# Patient Record
Sex: Male | Born: 1980 | Race: White | Hispanic: No | Marital: Married | Smoking: Current every day smoker
Health system: Southern US, Community
[De-identification: ages and names within clinical notes are randomized; demographics above are authoritative.]

## PROBLEM LIST (undated history)

## (undated) ENCOUNTER — Ambulatory Visit (HOSPITAL_COMMUNITY): Admission: EM | Payer: BC Managed Care – PPO | Source: Home / Self Care

## (undated) DIAGNOSIS — F329 Major depressive disorder, single episode, unspecified: Secondary | ICD-10-CM

## (undated) DIAGNOSIS — I10 Essential (primary) hypertension: Secondary | ICD-10-CM

## (undated) DIAGNOSIS — F32A Depression, unspecified: Secondary | ICD-10-CM

## (undated) DIAGNOSIS — G43909 Migraine, unspecified, not intractable, without status migrainosus: Secondary | ICD-10-CM

## (undated) DIAGNOSIS — F419 Anxiety disorder, unspecified: Secondary | ICD-10-CM

## (undated) DIAGNOSIS — T1491XA Suicide attempt, initial encounter: Secondary | ICD-10-CM

## (undated) HISTORY — DX: Anxiety disorder, unspecified: F41.9

---
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
## 2002-02-02 ENCOUNTER — Emergency Department (HOSPITAL_COMMUNITY): Admission: EM | Admit: 2002-02-02 | Discharge: 2002-02-02 | Payer: Self-pay | Admitting: *Deleted

## 2002-02-04 ENCOUNTER — Emergency Department (HOSPITAL_COMMUNITY): Admission: EM | Admit: 2002-02-04 | Discharge: 2002-02-04 | Payer: Self-pay | Admitting: Emergency Medicine

## 2002-02-15 ENCOUNTER — Emergency Department (HOSPITAL_COMMUNITY): Admission: EM | Admit: 2002-02-15 | Discharge: 2002-02-15 | Payer: Self-pay | Admitting: Emergency Medicine

## 2002-07-31 ENCOUNTER — Encounter: Admission: RE | Admit: 2002-07-31 | Discharge: 2002-10-29 | Payer: Self-pay

## 2003-01-11 ENCOUNTER — Emergency Department (HOSPITAL_COMMUNITY): Admission: EM | Admit: 2003-01-11 | Discharge: 2003-01-11 | Payer: Self-pay | Admitting: Emergency Medicine

## 2004-09-08 ENCOUNTER — Ambulatory Visit: Payer: Self-pay

## 2006-07-27 ENCOUNTER — Emergency Department (HOSPITAL_COMMUNITY): Admission: EM | Admit: 2006-07-27 | Discharge: 2006-07-27 | Payer: Self-pay | Admitting: Emergency Medicine

## 2008-01-14 ENCOUNTER — Emergency Department: Payer: Self-pay | Admitting: Emergency Medicine

## 2008-01-14 ENCOUNTER — Emergency Department (HOSPITAL_COMMUNITY): Admission: EM | Admit: 2008-01-14 | Discharge: 2008-01-15 | Payer: Self-pay | Admitting: Emergency Medicine

## 2008-04-13 ENCOUNTER — Emergency Department (HOSPITAL_COMMUNITY): Admission: EM | Admit: 2008-04-13 | Discharge: 2008-04-13 | Payer: Self-pay | Admitting: Emergency Medicine

## 2009-05-23 ENCOUNTER — Emergency Department (HOSPITAL_BASED_OUTPATIENT_CLINIC_OR_DEPARTMENT_OTHER): Admission: EM | Admit: 2009-05-23 | Discharge: 2009-05-23 | Payer: Self-pay | Admitting: Emergency Medicine

## 2009-07-13 ENCOUNTER — Emergency Department (HOSPITAL_COMMUNITY): Admission: EM | Admit: 2009-07-13 | Discharge: 2009-07-13 | Payer: Self-pay | Admitting: Emergency Medicine

## 2009-07-15 ENCOUNTER — Emergency Department (HOSPITAL_COMMUNITY): Admission: EM | Admit: 2009-07-15 | Discharge: 2009-07-15 | Payer: Self-pay | Admitting: Emergency Medicine

## 2010-01-11 ENCOUNTER — Emergency Department (HOSPITAL_COMMUNITY): Admission: EM | Admit: 2010-01-11 | Discharge: 2010-01-11 | Payer: Self-pay | Admitting: Family Medicine

## 2010-09-06 LAB — CULTURE, ROUTINE-ABSCESS

## 2010-09-23 LAB — GC/CHLAMYDIA PROBE AMP, URINE
Chlamydia, Swab/Urine, PCR: NEGATIVE
GC Probe Amp, Urine: NEGATIVE

## 2010-09-23 LAB — DIFFERENTIAL
Basophils Absolute: 0 K/uL (ref 0.0–0.1)
Basophils Relative: 0 % (ref 0–1)
Eosinophils Absolute: 0.1 K/uL (ref 0.0–0.7)
Eosinophils Relative: 1 % (ref 0–5)
Lymphocytes Relative: 18 % (ref 12–46)
Lymphs Abs: 1.8 K/uL (ref 0.7–4.0)
Monocytes Absolute: 0.7 10*3/uL (ref 0.1–1.0)
Monocytes Relative: 7 % (ref 3–12)
Neutro Abs: 7.7 10*3/uL (ref 1.7–7.7)
Neutrophils Relative %: 74 % (ref 43–77)

## 2010-09-23 LAB — CBC
HCT: 45.9 % (ref 39.0–52.0)
Hemoglobin: 15.8 g/dL (ref 13.0–17.0)
MCHC: 34.5 g/dL (ref 30.0–36.0)
MCV: 86.9 fL (ref 78.0–100.0)
Platelets: 241 10*3/uL (ref 150–400)
RBC: 5.28 MIL/uL (ref 4.22–5.81)
RDW: 11.9 % (ref 11.5–15.5)
WBC: 10.3 10*3/uL (ref 4.0–10.5)

## 2010-09-23 LAB — URINALYSIS, ROUTINE W REFLEX MICROSCOPIC
Bilirubin Urine: NEGATIVE
Glucose, UA: NEGATIVE mg/dL
Hgb urine dipstick: NEGATIVE
Ketones, ur: NEGATIVE mg/dL
Nitrite: NEGATIVE
Protein, ur: NEGATIVE mg/dL
Specific Gravity, Urine: 1.03 (ref 1.005–1.030)
Urobilinogen, UA: 1 mg/dL (ref 0.0–1.0)
pH: 6 (ref 5.0–8.0)

## 2010-09-23 LAB — POCT TOXICOLOGY PANEL
Cocaine: POSITIVE
Tetrahydrocannabinol: POSITIVE

## 2010-09-23 LAB — COMPREHENSIVE METABOLIC PANEL WITH GFR
ALT: 39 U/L (ref 0–53)
CO2: 25 meq/L (ref 19–32)
Calcium: 9.7 mg/dL (ref 8.4–10.5)
GFR calc non Af Amer: >60 mL/min (ref >60)
Glucose, Bld: 108 mg/dL — ABNORMAL HIGH (ref 70–99)
Sodium: 145 meq/L (ref 135–145)
Total Bilirubin: 0.7 mg/dL (ref 0.3–1.2)

## 2010-09-23 LAB — COMPREHENSIVE METABOLIC PANEL
AST: 24 U/L (ref 0–37)
Albumin: 5.1 g/dL (ref 3.5–5.2)
Alkaline Phosphatase: 103 U/L (ref 39–117)
BUN: 14 mg/dL (ref 6–23)
Chloride: 103 mEq/L (ref 96–112)
Creatinine, Ser: 1 mg/dL (ref 0.4–1.5)
GFR calc Af Amer: >60 mL/min (ref >60)
Potassium: 4.1 mEq/L (ref 3.5–5.1)
Total Protein: 7.9 g/dL (ref 6.0–8.3)

## 2010-09-23 LAB — URINE CULTURE
Colony Count: NO GROWTH
Culture: NO GROWTH

## 2010-09-23 LAB — URINE MICROSCOPIC-ADD ON

## 2011-09-19 ENCOUNTER — Encounter (HOSPITAL_COMMUNITY): Payer: Self-pay | Admitting: *Deleted

## 2011-09-19 ENCOUNTER — Emergency Department (HOSPITAL_COMMUNITY)
Admission: EM | Admit: 2011-09-19 | Discharge: 2011-09-20 | Disposition: A | Discharge status: Other Acute Inpatient Hospital | Payer: BC Managed Care – PPO | Attending: Emergency Medicine | Admitting: Emergency Medicine

## 2011-09-19 DIAGNOSIS — F489 Nonpsychotic mental disorder, unspecified: Secondary | ICD-10-CM | POA: Insufficient documentation

## 2011-09-19 DIAGNOSIS — T1491XA Suicide attempt, initial encounter: Secondary | ICD-10-CM

## 2011-09-19 DIAGNOSIS — F329 Major depressive disorder, single episode, unspecified: Secondary | ICD-10-CM | POA: Insufficient documentation

## 2011-09-19 DIAGNOSIS — F3289 Other specified depressive episodes: Secondary | ICD-10-CM | POA: Insufficient documentation

## 2011-09-19 DIAGNOSIS — X838XXA Intentional self-harm by other specified means, initial encounter: Secondary | ICD-10-CM | POA: Insufficient documentation

## 2011-09-19 LAB — COMPREHENSIVE METABOLIC PANEL
ALT: 34 U/L (ref 0–53)
AST: 21 U/L (ref 0–37)
Calcium: 9.2 mg/dL (ref 8.4–10.5)
Creatinine, Ser: 0.86 mg/dL (ref 0.50–1.35)
GFR calc Af Amer: >90 mL/min (ref >90)
GFR calc non Af Amer: >90 mL/min (ref >90)
Glucose, Bld: 99 mg/dL (ref 70–99)
Sodium: 138 mEq/L (ref 135–145)
Total Protein: 7 g/dL (ref 6.0–8.3)

## 2011-09-19 LAB — URINALYSIS, ROUTINE W REFLEX MICROSCOPIC
Hgb urine dipstick: NEGATIVE
Nitrite: NEGATIVE
Specific Gravity, Urine: 1.026 (ref 1.005–1.030)
pH: 6 (ref 5.0–8.0)

## 2011-09-19 LAB — CBC
HCT: 41.7 % (ref 39.0–52.0)
MCH: 29.2 pg (ref 26.0–34.0)
MCV: 83.4 fL (ref 78.0–100.0)
Platelets: 202 10*3/uL (ref 150–400)
RDW: 12.4 % (ref 11.5–15.5)

## 2011-09-19 LAB — DIFFERENTIAL
Basophils Absolute: 0 10*3/uL (ref 0.0–0.1)
Eosinophils Absolute: 0 10*3/uL (ref 0.0–0.7)
Eosinophils Relative: 0 % (ref 0–5)
Monocytes Absolute: 1.1 10*3/uL — ABNORMAL HIGH (ref 0.1–1.0)

## 2011-09-19 LAB — RAPID URINE DRUG SCREEN, HOSP PERFORMED
Barbiturates: NOT DETECTED
Benzodiazepines: POSITIVE — AB
Cocaine: NOT DETECTED
Opiates: POSITIVE — AB

## 2011-09-19 LAB — POCT I-STAT, CHEM 8
Chloride: 105 mEq/L (ref 96–112)
Creatinine, Ser: 0.8 mg/dL (ref 0.50–1.35)
Glucose, Bld: 127 mg/dL — ABNORMAL HIGH (ref 70–99)
HCT: 44 % (ref 39.0–52.0)
Hemoglobin: 15 g/dL (ref 13.0–17.0)
Potassium: 3.6 mEq/L (ref 3.5–5.1)
Sodium: 142 mEq/L (ref 135–145)

## 2011-09-19 LAB — URINE MICROSCOPIC-ADD ON

## 2011-09-19 MED ORDER — ONDANSETRON HCL 4 MG PO TABS: 4.0000 mg | ORAL_TABLET | Freq: Three times a day (TID) | ORAL | Status: DC | PRN

## 2011-09-19 MED ORDER — IBUPROFEN 600 MG PO TABS
600.0000 mg | ORAL_TABLET | Freq: Three times a day (TID) | ORAL | Status: DC | PRN
  Administered 2011-09-19: 600 mg via ORAL
  Filled 2011-09-19: qty 1

## 2011-09-19 MED ORDER — POTASSIUM CHLORIDE CRYS ER 20 MEQ PO TBCR
40.0000 meq | EXTENDED_RELEASE_TABLET | Freq: Once | ORAL | Status: AC
  Administered 2011-09-19: 40 meq via ORAL
  Filled 2011-09-19: qty 2

## 2011-09-19 MED ORDER — NICOTINE 21 MG/24HR TD PT24: 21.0000 mg | MEDICATED_PATCH | Freq: Every day | TRANSDERMAL | Status: DC

## 2011-09-19 MED ORDER — ALUM & MAG HYDROXIDE-SIMETH 200-200-20 MG/5ML PO SUSP: 30.0000 mL | ORAL | Status: DC | PRN

## 2011-09-19 MED ORDER — ZOLPIDEM TARTRATE 5 MG PO TABS
5.0000 mg | ORAL_TABLET | Freq: Every evening | ORAL | Status: DC | PRN
  Administered 2011-09-19: 5 mg via ORAL
  Filled 2011-09-19: qty 1

## 2011-09-19 MED ORDER — ACETAMINOPHEN 325 MG PO TABS: 650.0000 mg | ORAL_TABLET | ORAL | Status: DC | PRN

## 2011-09-19 NOTE — ED Notes (Addendum)
Mom at bedside.

## 2011-09-19 NOTE — ED Notes (Signed)
Talking w/ dad

## 2011-09-19 NOTE — ED Notes (Signed)
Pt's brother into see 

## 2011-09-19 NOTE — ED Provider Notes (Signed)
History     CSN: [id number]  Arrival date & time 09/19/11  2956   None     No chief complaint on file.   (Consider location/radiation/quality/duration/timing/severity/associated sxs/prior treatment) The history is provided by the patient, the EMS personnel and a relative.   31 year old male was brought in by ambulance after trying to hang himself in a tree with an electrical cord. He states that he is depressed because his wife left him and he actually would not tell me what had happened only saying that he would go and do it again. He admits to crying spells and anhedonia. He denies sleep disturbance and denies hallucinations. He denies drug use. He will speak to me only very briefly. The information regarding his wife leaving and was supplied by a relative who is with him.  No past medical history on file.  No past surgical history on file.  No family history on file.  History  Substance Use Topics  . Smoking status: Not on file  . Smokeless tobacco: Not on file  . Alcohol Use: Not on file      Review of Systems  All other systems reviewed and are negative.    Allergies  Review of patient's allergies indicates no known allergies.  Home Medications  No current outpatient prescriptions on file.  BP 148/94  Pulse 93  Temp(Src) 98.7 F (37.1 C) (Oral)  Resp 20  SpO2 96%  Physical Exam  Nursing note and vitals reviewed.  31 year old male who is resting comfortably and in no acute distress. Vital signs are significant for mild hypertension with blood pressure 140/94. Oxygen saturation is 96% which is normal. Head is normocephalic and atraumatic, although a few petechiae are present on the face. Oropharynx is clear. Neck is nontender and supple. There is no stridor and no crepitus. There's no swelling and I cannot see a mark from the cord up on his neck. Back is nontender. Lungs are clear without rales, wheezes, or rhonchi. Heart has regular rate and rhythm without  murmur. Abdomen is soft, flat, nontender without masses or hepatosplenomegaly. Jimmy's have full range of motion, no cyanosis or edema. Skin is warm and dry without other rash. Neurologic: Cranial nerves are intact, there no focal motor or sensory deficits. Psychiatric: He appears overtly depressed. Most of the time he is laying still staring straight ahead and does not make eye contact. He will get angry about his relative not helping to get his wife back and yells at her that "it's your fault that this happened and I hate you".  ED Course  Procedures (including critical care time)  Results for orders placed during the hospital encounter of 09/19/11  CBC      Component Value Range   WBC 17.5 (*) 4.0 - 10.5 (K/uL)   RBC 5.00  4.22 - 5.81 (MIL/uL)   Hemoglobin 14.6  13.0 - 17.0 (g/dL)   HCT 21.3  08.6 - 57.8 (%)   MCV 83.4  78.0 - 100.0 (fL)   MCH 29.2  26.0 - 34.0 (pg)   MCHC 35.0  30.0 - 36.0 (g/dL)   RDW 46.9  62.9 - 52.8 (%)   Platelets 202  150 - 400 (K/uL)  DIFFERENTIAL      Component Value Range   Neutrophils Relative 84 (*) 43 - 77 (%)   Neutro Abs 14.7 (*) 1.7 - 7.7 (K/uL)   Lymphocytes Relative 9 (*) 12 - 46 (%)   Lymphs Abs 1.6  0.7 - 4.0 (K/uL)  Monocytes Relative 6  3 - 12 (%)   Monocytes Absolute 1.1 (*) 0.1 - 1.0 (K/uL)   Eosinophils Relative 0  0 - 5 (%)   Eosinophils Absolute 0.0  0.0 - 0.7 (K/uL)   Basophils Relative 0  0 - 1 (%)   Basophils Absolute 0.0  0.0 - 0.1 (K/uL)  COMPREHENSIVE METABOLIC PANEL      Component Value Range   Sodium 138  135 - 145 (mEq/L)   Potassium 3.3 (*) 3.5 - 5.1 (mEq/L)   Chloride 98  96 - 112 (mEq/L)   CO2 28  19 - 32 (mEq/L)   Glucose, Bld 99  70 - 99 (mg/dL)   BUN 11  6 - 23 (mg/dL)   Creatinine, Ser 1.61  0.50 - 1.35 (mg/dL)   Calcium 9.2  8.4 - 09.6 (mg/dL)   Total Protein 7.0  6.0 - 8.3 (g/dL)   Albumin 4.4  3.5 - 5.2 (g/dL)   AST 21  0 - 37 (U/L)   ALT 34  0 - 53 (U/L)   Alkaline Phosphatase 91  39 - 117 (U/L)   Total  Bilirubin 0.4  0.3 - 1.2 (mg/dL)   GFR calc non Af Amer >90  >90 (mL/min)   GFR calc Af Amer >90  >90 (mL/min)  ETHANOL      Component Value Range   Alcohol, Ethyl (B) <11  0 - 11 (mg/dL)  URINE RAPID DRUG SCREEN (HOSP PERFORMED)      Component Value Range   Opiates POSITIVE (*) NONE DETECTED    Cocaine NONE DETECTED  NONE DETECTED    Benzodiazepines POSITIVE (*) NONE DETECTED    Amphetamines NONE DETECTED  NONE DETECTED    Tetrahydrocannabinol POSITIVE (*) NONE DETECTED    Barbiturates NONE DETECTED  NONE DETECTED   URINALYSIS, ROUTINE W REFLEX MICROSCOPIC      Component Value Range   Color, Urine YELLOW  YELLOW    APPearance CLOUDY (*) CLEAR    Specific Gravity, Urine 1.026  1.005 - 1.030    pH 6.0  5.0 - 8.0    Glucose, UA NEGATIVE  NEGATIVE (mg/dL)   Hgb urine dipstick NEGATIVE  NEGATIVE    Bilirubin Urine SMALL (*) NEGATIVE    Ketones, ur TRACE (*) NEGATIVE (mg/dL)   Protein, ur NEGATIVE  NEGATIVE (mg/dL)   Urobilinogen, UA 1.0  0.0 - 1.0 (mg/dL)   Nitrite NEGATIVE  NEGATIVE    Leukocytes, UA SMALL (*) NEGATIVE   URINE MICROSCOPIC-ADD ON      Component Value Range   WBC, UA 3-6  <3 (WBC/hpf)   Bacteria, UA RARE  RARE    Crystals CA OXALATE CRYSTALS (*) NEGATIVE   POCT I-STAT, CHEM 8      Component Value Range   Sodium 142  135 - 145 (mEq/L)   Potassium 3.6  3.5 - 5.1 (mEq/L)   Chloride 105  96 - 112 (mEq/L)   BUN 7  6 - 23 (mg/dL)   Creatinine, Ser 0.45  0.50 - 1.35 (mg/dL)   Glucose, Bld 409 (*) 70 - 99 (mg/dL)   Calcium, Ion 8.11  9.14 - 1.32 (mmol/L)   TCO2 25  0 - 100 (mmol/L)   Hemoglobin 15.0  13.0 - 17.0 (g/dL)   HCT 78.2  95.6 - 21.3 (%)     1. Suicide attempt   2. Depression       MDM  Maj. depression with suicide attempts and ongoing suicidal ideation. He will need inpatient  psychiatric care and is not willing to be admitted voluntarily. Involuntary commitment papers are filed because he is clearly a threat to himself.        Dione Booze,  MD 09/20/11 [phone]

## 2011-09-19 NOTE — ED Notes (Signed)
Talking quietly w/ dad

## 2011-09-19 NOTE — ED Notes (Signed)
Pt refusing blood work, Charity fundraiser aware

## 2011-09-19 NOTE — ED Notes (Signed)
Pt denies problems with swallowing ate solids and liqs well.

## 2011-09-19 NOTE — ED Notes (Signed)
CSW in w/ pt and his mom

## 2011-09-19 NOTE — ED Notes (Signed)
CSW into see,  Pt's mom at bedside

## 2011-09-19 NOTE — ED Notes (Signed)
Mom in w/ pt 

## 2011-09-19 NOTE — ED Notes (Signed)
Patients dad into see 

## 2011-09-19 NOTE — ED Notes (Signed)
Up to the bathroom 

## 2011-09-19 NOTE — ED Notes (Addendum)
[facility] requesting UA and pt receive potassium sup per bobby ACT

## 2011-09-19 NOTE — ED Notes (Signed)
Note--Pt's mother reports that  She had another son that committed suicide 24 yrs ago--pt was 31 yrs old at the time.

## 2011-09-19 NOTE — ED Notes (Signed)
EMS sts pt attempted to hang himself in a tree with an electrical cord. Pt denies ingestion of drugs or ETOH. None found by EMS/LEO at scene.

## 2011-09-19 NOTE — ED Notes (Signed)
Talking w/ brother

## 2011-09-19 NOTE — ED Notes (Signed)
WUJ:[id number]<JY> Expected date:09/19/11<BR> Expected time: 5:46 AM<BR> Means of arrival:Ambulance<BR> Comments:<BR> SI, attempt to use cord on the neck

## 2011-09-19 NOTE — BH Assessment (Signed)
Assessment Note   Janann Colonel, assessment counselor, submitted Pt for admission to [facility]. Serena Colonel, NP reviewed clinical information and accepted Pt on the condition he be given a potassium supplement and have a urinalysis completed. Consulted with Theodoro Kos, [facility] who said there is not an appropriate bed available at this time. Notified Albesa Seen, assessment counselor at [facility], of disposition.   Patsy Baltimore, Harlin Rain 09/19/2011 2:46 PM
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
## 2011-09-19 NOTE — ED Notes (Signed)
Paster into see

## 2011-09-19 NOTE — ED Notes (Signed)
Talking quietly w/ mother 

## 2011-09-19 NOTE — ED Notes (Signed)
Pt's mother into see 

## 2011-09-20 ENCOUNTER — Encounter (HOSPITAL_COMMUNITY): Payer: Self-pay | Admitting: *Deleted

## 2011-09-20 ENCOUNTER — Inpatient Hospital Stay (HOSPITAL_COMMUNITY)
Admission: AD | Admit: 2011-09-20 | Discharge: 2011-09-23 | DRG: 885 | Disposition: A | Discharge status: Home / Self Care | Payer: Medicaid Other | Attending: Psychiatry | Admitting: Psychiatry

## 2011-09-20 DIAGNOSIS — Z560 Unemployment, unspecified: Secondary | ICD-10-CM

## 2011-09-20 DIAGNOSIS — Z9119 Patient's noncompliance with other medical treatment and regimen: Secondary | ICD-10-CM

## 2011-09-20 DIAGNOSIS — F4325 Adjustment disorder with mixed disturbance of emotions and conduct: Secondary | ICD-10-CM

## 2011-09-20 DIAGNOSIS — Z91199 Patient's noncompliance with other medical treatment and regimen due to unspecified reason: Secondary | ICD-10-CM

## 2011-09-20 DIAGNOSIS — F1994 Other psychoactive substance use, unspecified with psychoactive substance-induced mood disorder: Secondary | ICD-10-CM

## 2011-09-20 DIAGNOSIS — R45851 Suicidal ideations: Secondary | ICD-10-CM

## 2011-09-20 DIAGNOSIS — R4589 Other symptoms and signs involving emotional state: Secondary | ICD-10-CM | POA: Diagnosis present

## 2011-09-20 DIAGNOSIS — Z87891 Personal history of nicotine dependence: Secondary | ICD-10-CM

## 2011-09-20 DIAGNOSIS — Z79899 Other long term (current) drug therapy: Secondary | ICD-10-CM

## 2011-09-20 DIAGNOSIS — N390 Urinary tract infection, site not specified: Secondary | ICD-10-CM

## 2011-09-20 DIAGNOSIS — F339 Major depressive disorder, recurrent, unspecified: Principal | ICD-10-CM | POA: Diagnosis present

## 2011-09-20 DIAGNOSIS — R454 Irritability and anger: Secondary | ICD-10-CM | POA: Diagnosis present

## 2011-09-20 DIAGNOSIS — F329 Major depressive disorder, single episode, unspecified: Secondary | ICD-10-CM | POA: Diagnosis present

## 2011-09-20 DIAGNOSIS — F1910 Other psychoactive substance abuse, uncomplicated: Secondary | ICD-10-CM | POA: Diagnosis present

## 2011-09-20 DIAGNOSIS — F32A Depression, unspecified: Secondary | ICD-10-CM | POA: Diagnosis present

## 2011-09-20 DIAGNOSIS — IMO0002 Reserved for concepts with insufficient information to code with codable children: Secondary | ICD-10-CM | POA: Diagnosis present

## 2011-09-20 MED ORDER — ALUM & MAG HYDROXIDE-SIMETH 200-200-20 MG/5ML PO SUSP
30.0000 mL | ORAL | Status: DC | PRN
Start: 2011-09-20 — End: 2011-09-23

## 2011-09-20 MED ORDER — LOPERAMIDE HCL 2 MG PO CAPS: 2.0000 mg | ORAL_CAPSULE | ORAL | Status: DC | PRN

## 2011-09-20 MED ORDER — METHOCARBAMOL 500 MG PO TABS
500.0000 mg | ORAL_TABLET | Freq: Three times a day (TID) | ORAL | Status: DC | PRN
  Filled 2011-09-20: qty 9

## 2011-09-20 MED ORDER — HYDROXYZINE HCL 25 MG PO TABS
25.0000 mg | ORAL_TABLET | Freq: Four times a day (QID) | ORAL | Status: DC | PRN
  Administered 2011-09-21: 25 mg via ORAL

## 2011-09-20 MED ORDER — NAPROXEN 500 MG PO TABS: 500.0000 mg | ORAL_TABLET | Freq: Two times a day (BID) | ORAL | Status: DC | PRN

## 2011-09-20 MED ORDER — MAGNESIUM HYDROXIDE 400 MG/5ML PO SUSP: 30.0000 mL | Freq: Every day | ORAL | Status: DC | PRN

## 2011-09-20 MED ORDER — ACETAMINOPHEN 325 MG PO TABS: 650.0000 mg | ORAL_TABLET | Freq: Four times a day (QID) | ORAL | Status: DC | PRN

## 2011-09-20 MED ORDER — CLONIDINE HCL 0.1 MG PO TABS
0.1000 mg | ORAL_TABLET | ORAL | Status: DC
  Administered 2011-09-22 – 2011-09-23 (×2): 0.1 mg via ORAL
  Filled 2011-09-20 (×4): qty 1

## 2011-09-20 MED ORDER — CIPROFLOXACIN HCL 500 MG PO TABS
500.0000 mg | ORAL_TABLET | Freq: Two times a day (BID) | ORAL | Status: DC
  Administered 2011-09-20 – 2011-09-23 (×7): 500 mg via ORAL
  Filled 2011-09-20 (×7): qty 1
  Filled 2011-09-20: qty 5
  Filled 2011-09-20 (×2): qty 1

## 2011-09-20 MED ORDER — SERTRALINE HCL 50 MG PO TABS
50.0000 mg | ORAL_TABLET | Freq: Every day | ORAL | Status: DC
  Administered 2011-09-20 – 2011-09-22 (×3): 50 mg via ORAL
  Filled 2011-09-20 (×4): qty 1
  Filled 2011-09-20: qty 14

## 2011-09-20 MED ORDER — CLONIDINE HCL 0.1 MG PO TABS
0.1000 mg | ORAL_TABLET | Freq: Every day | ORAL | Status: DC
  Filled 2011-09-20: qty 1

## 2011-09-20 MED ORDER — CLONIDINE HCL 0.1 MG PO TABS
0.1000 mg | ORAL_TABLET | Freq: Four times a day (QID) | ORAL | Status: AC
  Administered 2011-09-20 – 2011-09-22 (×7): 0.1 mg via ORAL
  Filled 2011-09-20 (×8): qty 1

## 2011-09-20 MED ORDER — ONDANSETRON 4 MG PO TBDP: 4.0000 mg | ORAL_TABLET | Freq: Four times a day (QID) | ORAL | Status: DC | PRN

## 2011-09-20 MED ORDER — DICYCLOMINE HCL 20 MG PO TABS: 20.0000 mg | ORAL_TABLET | ORAL | Status: DC | PRN

## 2011-09-20 NOTE — ED Provider Notes (Addendum)
Pt is suicidal and accepted at [facility] for later today for admission.  No acute medical issues.   Nat Christen, MD 09/20/11 [phone]  Pt accepted at [facility] by Dr. Dan Humphreys.    Nat Christen, MD 09/20/11 [phone]

## 2011-09-20 NOTE — [facility] Suicide Risk Assessment (Signed)
Suicide Risk Assessment  Admission Assessment     Demographic factors:  Assessment Details Time of Assessment: Admission Information Obtained From: Patient Current Mental Status:    Objective: Appearance: Fairly Groomed   Psychomotor Activity: Decreased   Eye Contact:: Minimal   Speech: Clear and Coherent   Volume: Normal   Mood:depressed   Affect: Congruent   Thought Process: clear rational goal oriented    Orientation: Full   Thought Content: No AVH or psychosis   Suicidal Thoughts: Yes. without intent/plan now   Homicidal Thoughts: No   Judgement: Impaired   Insight: Lacking    Loss Factors:  Loss Factors: Decrease in vocational status;Loss of significant relationship;Legal issues;Financial problems / change in socioeconomic status Historical Factors:  Historical Factors: Family history of suicide;Impulsivity Risk Reduction Factors:  Risk Reduction Factors: Sense of responsibility to family;Religious beliefs about death;Living with another person, especially a relative;Positive social support  CLINICAL FACTORS:   Alcohol/Substance Abuse/Dependencies  COGNITIVE FEATURES THAT CONTRIBUTE TO RISK:  Closed-mindedness    SUICIDE RISK:   Moderate:  Frequent suicidal ideation with limited intensity, and duration, some specificity in terms of plans, no associated intent, good self-control, limited dysphoria/symptomatology, some risk factors present, and identifiable protective factors, including available and accessible social support.  PLAN OF CARE:   Admit for safety & stabilization  Initiate antidepressant and get a better SA assessment -pt is minimizing  Will treat WBC's in urine with Cipro   Delford Wingert 09/20/2011, 5:25 PM

## 2011-09-20 NOTE — ED Notes (Signed)
Pt tearful, sad, doesn't want to be alone-offered reasurrance-allowed pt to verbalize, vent-will continue to monitor
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
## 2011-09-20 NOTE — H&P (Signed)
  Pt was seen by me today and I agree with the key elements documented in H&P.  

## 2011-09-20 NOTE — BH Assessment (Signed)
Per [facility] pt has been accepted by Dan Humphreys to bed 500-2. RN notified. [facility] requests pt go over at 9 am.

## 2011-09-20 NOTE — ED Notes (Signed)
Report received-airway intact-no s/s's of distress-resting quietly 
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
## 2011-09-20 NOTE — Tx Team (Signed)
Initial Interdisciplinary Treatment Plan  PATIENT STRENGTHS: (choose at least two) Ability for insight Average or above average intelligence Motivation for treatment/growth Religious Affiliation  PATIENT STRESSORS: Financial difficulties Legal issue Marital or family conflict Substance abuse   PROBLEM LIST: Problem List/Patient Goals Date to be addressed Date deferred Reason deferred Estimated date of resolution  Depression  09-20-11           Suicide attempt by hanging  09-20-11                                          DISCHARGE CRITERIA:  Improved stabilization in mood, thinking, and/or behavior Reduction of life-threatening or endangering symptoms to within safe limits Verbal commitment to aftercare and medication compliance  PRELIMINARY DISCHARGE PLAN: Attend aftercare/continuing care group  PATIENT/FAMIILY INVOLVEMENT: This treatment plan has been presented to and reviewed with the patient, KIAN OTTAVIANO, and/or family member, .  The patient and family have been given the opportunity to ask questions and make suggestions.  Valente David 09/20/2011, 10:55 AM

## 2011-09-20 NOTE — Progress Notes (Signed)
Patient ID: Shawn Schaefer, male   DOB: 12/21/1980, 31 y.o.   MRN: [id number] 09-20-11 nursing admission note: pt came to bh involuntarily stating he was depressed. He attempted suicide by trying to hand himself with an electrical cord. He has a family hx of suicide , his brother commited suicide. His stressors are he is unemployed, marital problems and legal problems. He was able to contract for suicide  on adm by a Engineer, manufacturing with the admitting Rn. He denied any hi/av, but did admit to verbal threats to his wife in the past. He appeared to be flat, has been having crying spells, and trouble focusing. He stated no allergies, is a non smoker and had some neck pain from the attempted hanging.  He had a positive uds for opiates, benzos and mj. Other labs are; cbc wdl except wbc at 17.5 high, urine cloudy, CMP wdl except k at 3.3 and etoh less than 11. He has had admissions at other facilities; [facility] in 2005 and [facility] in 2003 for depression, he stated.  Pt was escorted to the 500 hall. He was polite/cooperative on adm.   Emergency contact linda stanley at cell ph # [phone]

## 2011-09-20 NOTE — ED Notes (Signed)
Report given-transfer to behavioral health room 500-2-[employer] called for transport

## 2011-09-21 DIAGNOSIS — F339 Major depressive disorder, recurrent, unspecified: Secondary | ICD-10-CM | POA: Diagnosis present

## 2011-09-21 DIAGNOSIS — IMO0002 Reserved for concepts with insufficient information to code with codable children: Secondary | ICD-10-CM | POA: Diagnosis present

## 2011-09-21 DIAGNOSIS — F1910 Other psychoactive substance abuse, uncomplicated: Secondary | ICD-10-CM

## 2011-09-21 NOTE — Progress Notes (Signed)
[facility] MD Progress Note  09/21/2011 4:59 PM  S: "I need to be discharged. It is not doing me any good to be here. All I am doing is thinking about my wife. I want her back. I did not sleep well as result. I have a new job offer waiting for me out there. My mood is fair"  Diagnosis:  Axis I: Major depressive disorder, recurrent episodes, Substance abuse Axis II: Deferred Axis III: No past medical history on file. Axis IV: other psychosocial or environmental problems and Marital problems Axis V: 41-50 serious symptoms  ADL's:  Intact  Sleep: Poor  Appetite:  Fair  Suicidal Ideation:  Plan:  No Intent:  No Means:  no Homicidal Ideation:  Plan:  No Intent:  No Means:  No  AEB (as evidenced by):  Mental Status Examination/Evaluation: Objective:  Appearance: Casual  Eye Contact::  Fair  Speech:  Clear and Coherent  Volume:  Normal  Mood:  Depressed  Affect:  Flat  Thought Process:  Coherent  Orientation:  Full  Thought Content:  Rumination  Suicidal Thoughts:  No  Homicidal Thoughts:  No  Memory:  Immediate;   Good Recent;   Good Remote;   Good  Judgement:  Poor  Insight:  Lacking  Psychomotor Activity:  Normal  Concentration:  Fair  Recall:  Good  Akathisia:  No  Handed:  Right  AIMS (if indicated):     Assets:  Desire for Improvement  Sleep:  Number of Hours: 6.25    Vital Signs:Blood pressure 130/87, pulse 74, temperature 97.2 F (36.2 C), temperature source Oral, resp. rate 18, height 5' 4.75" (1.645 m), weight 69.4 kg (153 lb). Current Medications: Current Facility-Administered Medications  Medication Dose Route Frequency Provider Last Rate Last Dose  . acetaminophen (TYLENOL) tablet 650 mg  650 mg Oral Q6H PRN Mickie D. Adams, PA      . alum & mag hydroxide-simeth (MAALOX/MYLANTA) 200-200-20 MG/5ML suspension 30 mL  30 mL Oral Q4H PRN Mickie D. Adams, PA      . ciprofloxacin (CIPRO) tablet 500 mg  500 mg Oral BID Mickie D. Adams, PA   500 mg at 09/21/11 0829    . cloNIDine (CATAPRES) tablet 0.1 mg  0.1 mg Oral QID Mickie D. Adams, PA   0.1 mg at 09/21/11 1256   Followed by  . cloNIDine (CATAPRES) tablet 0.1 mg  0.1 mg Oral BH-qamhs Mickie D. Adams, PA       Followed by  . cloNIDine (CATAPRES) tablet 0.1 mg  0.1 mg Oral QAC breakfast Mickie D. Adams, PA      . dicyclomine (BENTYL) tablet 20 mg  20 mg Oral Q4H PRN Mickie D. Adams, PA      . hydrOXYzine (ATARAX/VISTARIL) tablet 25 mg  25 mg Oral Q6H PRN Mickie D. Adams, PA      . loperamide (IMODIUM) capsule 2-4 mg  2-4 mg Oral PRN Mickie D. Adams, PA      . magnesium hydroxide (MILK OF MAGNESIA) suspension 30 mL  30 mL Oral Daily PRN Mickie D. Adams, PA      . methocarbamol (ROBAXIN) tablet 500 mg  500 mg Oral Q8H PRN Mickie D. Adams, PA      . naproxen (NAPROSYN) tablet 500 mg  500 mg Oral BID PRN Mickie D. Adams, PA      . ondansetron (ZOFRAN-ODT) disintegrating tablet 4 mg  4 mg Oral Q6H PRN Mickie D. Adams, PA      . sertraline (  ZOLOFT) tablet 50 mg  50 mg Oral QHS Mickie D. Adams, PA   50 mg at 09/20/11 2210    Lab Results:  Results for orders placed during the hospital encounter of 09/20/11 (from the past 48 hour(s))  TSH     Status: Normal   Collection Time   09/20/11  7:47 PM      Component Value Range Comment   TSH 1.266  0.350 - 4.500 (uIU/mL)     Physical Findings: AIMS:  , ,  ,  ,    CIWA:  CIWA-Ar Total: 2  COWS:     Treatment Plan Summary: Daily contact with patient to assess and evaluate symptoms and progress in treatment Medication management  Plan: Seroquel 100 mg Q bedtime for sleep and mood.           Continue current treatment plan.  Armandina Stammer I 09/21/2011, 4:59 PM

## 2011-09-21 NOTE — Progress Notes (Signed)
[facility] Group Notes: (Counselor/Nursing/MHT/Case Management/Adjunct) 09/21/2011   @11 :00am Overcoming Obstacles to Wellness   Type of Therapy:  Group Therapy  Participation Level:  None  Participation Quality: None   Affect:   Flat  Cognitive:  Appropriate  Insight:  None  Engagement in Group: None  Engagement in Therapy:  None  Modes of Intervention:  Support and Exploration  Summary of Progress/Problems: Shawn Schaefer  was attentive but not engaged in group process    [name] 09/21/2011 12:18 PM

## 2011-09-21 NOTE — Tx Team (Addendum)
Interdisciplinary Treatment Plan Update (Adult)  Date:  09/21/2011  Time Reviewed:  3:52 PM   Progress in Treatment: Attending groups: Yes Participating in groups:  No, not participating but seems attentive Taking medication as prescribed:  Yes Tolerating medication: Yes Family/Significant othe contact made:  Yes, contact made with: mother, [name] Patient understands diagnosis: Yes Discussing patient identified problems/goals with staff:  Yes Medical problems stabilized or resolved: Yes Denies suicidal/homicidal ideation: Yes Issues/concerns per patient self-inventory:  No  Other:  New problem(s) identified: None  Reason for Continuation of Hospitalization: Anxiety Depression Medication stabilization  Interventions implemented related to continuation of hospitalization:  Medication stabilization, safety checks q 15 mins, group attendance  Additional comments:  Estimated length of stay: 2-4 days  Discharge Plan: [name] will discharge home and follow up with Dr. Dub Mikes ([facility])  New goal(s):  Review of initial/current patient goals per problem list:   1.  Goal(s): Decrease depressive symptoms to rating of 4 or lower  Met:  Yes  Target date: by discharge  As evidenced by: Shawn Schaefer rates depression at a 4 today  2.  Goal (s): Decrease hopelessness rating to 4 or lower  Met:  No  Target date: by discharge  As evidenced by: Shawn Schaefer rates hopelessness at a 5 today  3.  Goal(s): Reduce potential for self-harm  Met:  Yes  Target date: by discharge  As evidenced by: Shawn Schaefer denies suicidal thoughts at this time   4.  Goal(s): Examine substance use patterns  Met:  No   Target date: by discharge  As evidenced by: Shawn Schaefer denies substance abuse problems though he reported substance abuse at admission   Attendees: Patient:     Family:     Physician:  Dr Mickeal Skinner, MD 09/21/2011 10:41AM  Nursing:   Quintella Reichert, RN 09/21/2011 10:41AM  Case Manager:   Reyes Ivan, LCSWA 09/21/2011 10:41AM  Counselor:  Angus Palms, LCSW 09/21/2011 10:41AM  Other: Neill Loft, RN 09/21/2011 10:41AM  Other:  Serena Colonel, NP 09/21/2011 10:41AM  Other:     Other:      Scribe for Treatment Team:   [name], 09/21/2011 10:41AM

## 2011-09-21 NOTE — Progress Notes (Addendum)
On patient self inventory, patient need sleep meds, has improving appetite, low energy levl, improving attention span.  Rated depression #4, hopelessness #5.  Denied withdrawals.  Stated he tried to hang himself, still has sore neck and throat.  Worst pain #4.  After discharge, plans to take his meds and go to MD.  Does not think he needs to be here, tried to hang self because he had been fighting with his wife.  Plans to go to his mom's house after discharge.  Does not have money to buy meds after discharge.   1800   Patient wants to be discharged so he can start his new job.   Will discuss with MD tomorrow.

## 2011-09-21 NOTE — Progress Notes (Addendum)
[facility] Group Notes: (Counselor/Nursing/MHT/Case Management/Adjunct) 09/21/2011   @1 :15pm Value-driven Life Type of Therapy: Group Therapy   Participation Level: None   Participation Quality: None   Affect: Flat   Cognitive: Appropriate   Insight: None   Engagement in Group: None   Engagement in Therapy: None   Modes of Intervention: Support and Exploration   Summary of Progress/Problems: Gavon was attentive but not engaged in group process   [name] 09/21/2011 3:51 PM

## 2011-09-22 NOTE — Progress Notes (Addendum)
Patient ID: ZAION HREHA, male   DOB: February 11, 1981, 31 y.o.   MRN: [id number] Patient's self inventory form, sleeps well, improving appetite, normal energy level, improving attention span.   Rated depression #3, hopelessness #2.  Denied SI.  Zero pain goal, worst pain #1.  After discharge, plans to start a job and get one on one counseling.  Unsure of discharge date?  No discharge plans.  No problems staying on meds after discharge.   Sleep meds last night caused him to be groggy this morning.    Will discuss meds with MD.
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
## 2011-09-22 NOTE — Discharge Planning (Signed)
Met with patient in Aftercare Planning Group.   He will leave hospital and stay with mother for awhile while trying to work out things with wife.  He can arrange transportation at discharge.  He wants to discharge, as there is a job waiting for him.  He states he does not need help contacting the job.  He also states his main goal for hospitalization has been to "calm down".  Patient sees Dr. Dub Mikes, and Case Manager set up follow-up appointment with Dr. Dub Mikes for 09/30/11 at 1:40PM.  During Aftercare Planning Group, Case Manager provided psychoeducation on "Suicide Prevention Information."  This included descriptions of risk factors for suicide, warning signs that an individual is in crisis and thinking of suicide, and what to do if this occurs.  Pt did not participate in lively discussion, but indicated understanding of information provided, and will read brochure given upon discharge.     Ambrose Mantle, LCSW 09/22/2011, 10:05 AM

## 2011-09-22 NOTE — Progress Notes (Signed)
[facility] Group Notes: (Counselor/Nursing/MHT/Case Management/Adjunct) 09/22/2011   @1 :15pm Self-labeling  Type of Therapy:  Group Therapy  Participation Level:  None  Participation Quality: Attentive   Affect:  Blunted  Cognitive:  Appropriate  Insight:  None  Engagement in Group: Minimal  Engagement in Therapy:  None  Modes of Intervention:  Support and Exploration  Summary of Progress/Problems: Shawn Schaefer  was attentive but not engaged in group process    [name] 09/22/2011 2:25 PM

## 2011-09-22 NOTE — Progress Notes (Signed)
[facility] Group Notes:  (Counselor/Nursing/MHT/Case Management/Adjunct)    Type of Therapy:  Group Therapy  Participation Level:  Did Not Attend       [name] 09/22/2011  1:11 PM
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
## 2011-09-23 DIAGNOSIS — F1920 Other psychoactive substance dependence, uncomplicated: Secondary | ICD-10-CM

## 2011-09-23 MED ORDER — METHOCARBAMOL 500 MG PO TABS: 500.0000 mg | ORAL_TABLET | Freq: Three times a day (TID) | ORAL | Status: AC | PRN

## 2011-09-23 MED ORDER — SERTRALINE HCL 50 MG PO TABS: 50.0000 mg | ORAL_TABLET | Freq: Every day | ORAL | Status: DC

## 2011-09-23 MED ORDER — CIPROFLOXACIN HCL 500 MG PO TABS: 500.0000 mg | ORAL_TABLET | Freq: Two times a day (BID) | ORAL | Status: AC

## 2011-09-23 MED ORDER — CLONIDINE HCL 0.1 MG PO TABS: ORAL_TABLET | ORAL | Status: DC

## 2011-09-23 MED ORDER — CLONIDINE HCL 0.1 MG PO TABS
0.1000 mg | ORAL_TABLET | ORAL | Status: DC
  Filled 2011-09-23: qty 3

## 2011-09-23 NOTE — Progress Notes (Signed)
Patient ID: Shawn Schaefer, male   DOB: 11/14/1980, 31 y.o.   MRN: [id number] Pt. Awake, alert, NAD.  Appropriately groomed and dressed.  Affect: blunter, mood: anxious.  Pt. Coughed up bloody/mucoid discharge.  This Clinical research associate looked at his pharnyx/throat, noted scant amount of bright red blood in that are but otherwise no visible trauma.

## 2011-09-23 NOTE — Progress Notes (Signed)
[facility] Case Management Discharge Plan:  Will you be returning to the same living situation after discharge: Yes.  Patient plans to live with mother at discharge At discharge, do you have transportation home?:Yes,  Patient will arrange transportation Do you have the ability to pay for your medications:No.  Patient to be assisted with medications  Interagency Information:     Release of information consent forms completed and in the chart;  Patient's signature needed at discharge.  Patient to Follow up at:  Follow-up Information    Follow up with Dr. Geoffery Lyons on 09/30/2011. (1:40PM appointment)    Contact information:   [facility]  [address] [place]  [address]  [phone]         Patient denies SI/HI:   Yes,  Patient reports he has hope and believes he and wife can work through problems.  He is not endorsing SI or HI  Safety Planning and Suicide Prevention discussed:  Yes,  Reviewed during discharge planning group  Barrier to discharge identified:Yes,  Maritial problems  Summary and Recommendations  Patient to follow up with [facility] for counseling and will also follow up with professional counseling   Wynn Banker 09/23/2011, 10:16 AM

## 2011-09-23 NOTE — H&P (Signed)
Medical/psychiatric screening examination/treatment/procedure(s) were performed by non-physician practitioner and as supervising physician I was immediately available for consultation/collaboration.   I have seen and examined this patient and agree with this evaluation.  

## 2011-09-23 NOTE — Discharge Instructions (Signed)
Attend 90 meetings in 90 days. Get trusted sponsor from the advise of others or from whomever in meetings seems to make sense, has a proven track record, will hold you responsible for your sobriety, and both expects and insists on total abstinence.  Work the steps HONESTLY with the trusted sponsor. Get obsessed with your recovery by often reminding yourself of how DEADLY this dredged horrible disease of addiction JUST IS. Focus the first month on speaker meetings where you specifically look at how your life has been wrecked by drugs/alcohol and how your life has been similar to that of the speakers.   

## 2011-09-23 NOTE — Progress Notes (Signed)
Patient ID: Shawn Schaefer, male   DOB: 1980-11-12, 31 y.o.   MRN: [id number]   Patient states ready for discharge today. Currently denies any SI/HI. Obtained sample meds along with meds called into pharmacy. Has f/u appointment with Dr. Dub Mikes. Mother came to pick up at this time.
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
## 2011-09-23 NOTE — Tx Team (Signed)
Interdisciplinary Treatment Plan Update (Adult)  Date:  09/23/2011  Time Reviewed:  10:48 AM   Progress in Treatment: Attending groups: Yes Participating in groups:  No, present and attentive but not processing in group Taking medication as prescribed:  Yes Tolerating medication: Yes Family/Significant othe contact made:  Yes, contact made with: mother, Shawn Schaefer Patient understands diagnosis: Yes (though he reports substances are not a problem) Discussing patient identified problems/goals with staff:  Yes Medical problems stabilized or resolved: Yes Denies suicidal/homicidal ideation: Yes Issues/concerns per patient self-inventory:  No  Other:  New problem(s) identified: None  Reason for Continuation of Hospitalization: Appropriate for discharge today  Interventions implemented related to continuation of hospitalization:  Medication stabilization, safety checks q 15 mins, group attendance  Additional comments:  Estimated length of stay: discharge today  Discharge Plan: Discharge home with mother, follow up with Dr. Dub Mikes at [facility] for medications and with [facility] for therapy  New goal(s):  Review of initial/current patient goals per problem list:  1. Goal(s): Decrease depressive symptoms to rating of 4 or lower  Met: Yes  Target date: by discharge  As evidenced by: [name] rates depression at a 2 today 2. Goal (s): Decrease hopelessness rating to 4 or lower  Met: Yes Target date: by discharge  As evidenced by: [name] rates hopelessness at a 3 today 3. Goal(s): Reduce potential for self-harm  Met: Yes  Target date: by discharge  As evidenced by: [name] denies suicidal thoughts at this time 4. Goal(s): Examine substance use patterns  Met: No  Target date: DEFFERRED  As evidenced by: [name] reports "I'm one of those people who can just stop - I ain't addicted to anything."    Attendees: Patient:  Shawn Schaefer 09/23/2011 10:48 AM  Family:      Physician:  Dr Orson Aloe, MD 09/23/2011 10:48 AM  Nursing:   Debbra Riding, RN 09/23/2011 10:48 AM  Case Manager:  Juline Patch, LCSW 09/23/2011 10:48 AM  Counselor:  Angus Palms, LCSW 09/23/2011 10:48 AM  Other:  Reyes Ivan, LCSWA 09/23/2011 10:48 AM  Other:  Omelia Blackwater, RN 09/23/2011 10:48 AM  Other:     Other:      Scribe for Treatment Team:   [name], 09/23/2011 10:48 AM

## 2011-09-24 NOTE — Discharge Summary (Signed)
I agree with this D/C Summary.  

## 2011-09-25 NOTE — Progress Notes (Signed)
Patient Discharge Instructions:  Psychiatric Admission Assessment Note Provided,  09/24/2011 Discharge Summary Note Provided,   09/24/2011 After Visit Summary (AVS) Provided,  09/24/2011 Face Sheet Provided, 09/24/2011 Faxed/Sent to the Next Level Care provider:  09/24/2011   Faxed to [facility] - Dr. Dub Mikes @ [phone]  Wandra Scot, 09/25/2011, 3:05 PM

## 2013-10-11 ENCOUNTER — Inpatient Hospital Stay (HOSPITAL_COMMUNITY)
Admission: AD | Admit: 2013-10-11 | Discharge: 2013-10-13 | DRG: 885 | Disposition: A | Discharge status: Home / Self Care | Payer: Federal, State, Local not specified - Other | Source: Intra-hospital | Attending: Psychiatry | Admitting: Psychiatry

## 2013-10-11 ENCOUNTER — Emergency Department (HOSPITAL_COMMUNITY)
Admission: EM | Admit: 2013-10-11 | Discharge: 2013-10-11 | Disposition: A | Discharge status: Other Acute Inpatient Hospital | Payer: BC Managed Care – PPO | Attending: Emergency Medicine | Admitting: Emergency Medicine

## 2013-10-11 ENCOUNTER — Encounter (HOSPITAL_COMMUNITY): Payer: Self-pay | Admitting: Emergency Medicine

## 2013-10-11 ENCOUNTER — Encounter (HOSPITAL_COMMUNITY): Payer: Self-pay | Admitting: *Deleted

## 2013-10-11 DIAGNOSIS — F329 Major depressive disorder, single episode, unspecified: Secondary | ICD-10-CM

## 2013-10-11 DIAGNOSIS — F4010 Social phobia, unspecified: Secondary | ICD-10-CM

## 2013-10-11 DIAGNOSIS — F1910 Other psychoactive substance abuse, uncomplicated: Secondary | ICD-10-CM

## 2013-10-11 DIAGNOSIS — F411 Generalized anxiety disorder: Secondary | ICD-10-CM | POA: Insufficient documentation

## 2013-10-11 DIAGNOSIS — R45851 Suicidal ideations: Secondary | ICD-10-CM | POA: Insufficient documentation

## 2013-10-11 DIAGNOSIS — F339 Major depressive disorder, recurrent, unspecified: Secondary | ICD-10-CM | POA: Insufficient documentation

## 2013-10-11 DIAGNOSIS — R454 Irritability and anger: Secondary | ICD-10-CM

## 2013-10-11 DIAGNOSIS — R4589 Other symptoms and signs involving emotional state: Secondary | ICD-10-CM

## 2013-10-11 DIAGNOSIS — F911 Conduct disorder, childhood-onset type: Secondary | ICD-10-CM | POA: Insufficient documentation

## 2013-10-11 DIAGNOSIS — F410 Panic disorder [episodic paroxysmal anxiety] without agoraphobia: Secondary | ICD-10-CM | POA: Diagnosis present

## 2013-10-11 DIAGNOSIS — F321 Major depressive disorder, single episode, moderate: Principal | ICD-10-CM | POA: Diagnosis present

## 2013-10-11 DIAGNOSIS — R4689 Other symptoms and signs involving appearance and behavior: Secondary | ICD-10-CM

## 2013-10-11 DIAGNOSIS — IMO0002 Reserved for concepts with insufficient information to code with codable children: Secondary | ICD-10-CM

## 2013-10-11 DIAGNOSIS — Z87891 Personal history of nicotine dependence: Secondary | ICD-10-CM

## 2013-10-11 DIAGNOSIS — F1210 Cannabis abuse, uncomplicated: Secondary | ICD-10-CM | POA: Diagnosis present

## 2013-10-11 DIAGNOSIS — F32A Depression, unspecified: Secondary | ICD-10-CM

## 2013-10-11 DIAGNOSIS — Z87820 Personal history of traumatic brain injury: Secondary | ICD-10-CM

## 2013-10-11 DIAGNOSIS — G4700 Insomnia, unspecified: Secondary | ICD-10-CM | POA: Diagnosis present

## 2013-10-11 HISTORY — DX: Major depressive disorder, single episode, unspecified: F32.9

## 2013-10-11 HISTORY — DX: Depression, unspecified: F32.A

## 2013-10-11 HISTORY — DX: Suicide attempt, initial encounter: T14.91XA

## 2013-10-11 LAB — COMPREHENSIVE METABOLIC PANEL
ALK PHOS: 87 U/L (ref 39–117)
ALT: 41 U/L (ref 0–53)
AST: 26 U/L (ref 0–37)
Albumin: 5.1 g/dL (ref 3.5–5.2)
BUN: 12 mg/dL (ref 6–23)
CALCIUM: 10.1 mg/dL (ref 8.4–10.5)
CO2: 25 meq/L (ref 19–32)
Chloride: 98 mEq/L (ref 96–112)
Creatinine, Ser: 0.89 mg/dL (ref 0.50–1.35)
GLUCOSE: 109 mg/dL — AB (ref 70–99)
Potassium: 3.8 mEq/L (ref 3.7–5.3)
Sodium: 140 mEq/L (ref 137–147)
TOTAL PROTEIN: 8 g/dL (ref 6.0–8.3)
Total Bilirubin: 0.6 mg/dL (ref 0.3–1.2)

## 2013-10-11 LAB — RAPID URINE DRUG SCREEN, HOSP PERFORMED
Amphetamines: NOT DETECTED
BARBITURATES: NOT DETECTED
Benzodiazepines: NOT DETECTED
COCAINE: NOT DETECTED
OPIATES: NOT DETECTED
TETRAHYDROCANNABINOL: POSITIVE — AB

## 2013-10-11 LAB — CBC
HEMATOCRIT: 46.3 % (ref 39.0–52.0)
HEMOGLOBIN: 16.4 g/dL (ref 13.0–17.0)
MCH: 29.9 pg (ref 26.0–34.0)
MCHC: 35.4 g/dL (ref 30.0–36.0)
MCV: 84.5 fL (ref 78.0–100.0)
Platelets: 236 10*3/uL (ref 150–400)
RBC: 5.48 MIL/uL (ref 4.22–5.81)
RDW: 12.8 % (ref 11.5–15.5)
WBC: 15.6 10*3/uL — ABNORMAL HIGH (ref 4.0–10.5)

## 2013-10-11 LAB — SALICYLATE LEVEL: Salicylate Lvl: <2 mg/dL — ABNORMAL LOW (ref 2.8–20.0)

## 2013-10-11 LAB — ACETAMINOPHEN LEVEL: Acetaminophen (Tylenol), Serum: <15 ug/mL (ref 10–30)

## 2013-10-11 LAB — ETHANOL

## 2013-10-11 MED ORDER — STERILE WATER FOR INJECTION IJ SOLN
INTRAMUSCULAR | Status: AC
  Administered 2013-10-11: 2.1 mL
  Filled 2013-10-11: qty 10

## 2013-10-11 MED ORDER — TRAZODONE HCL 50 MG PO TABS
50.0000 mg | ORAL_TABLET | Freq: Every evening | ORAL | Status: DC | PRN
  Administered 2013-10-11 – 2013-10-12 (×2): 50 mg via ORAL
  Filled 2013-10-11: qty 14
  Filled 2013-10-11 (×3): qty 1
  Filled 2013-10-11: qty 14
  Filled 2013-10-11 (×4): qty 1

## 2013-10-11 MED ORDER — FLUOXETINE HCL 20 MG PO CAPS
20.0000 mg | ORAL_CAPSULE | Freq: Every day | ORAL | Status: DC
  Administered 2013-10-12 – 2013-10-13 (×2): 20 mg via ORAL
  Filled 2013-10-11 (×2): qty 1
  Filled 2013-10-11: qty 14
  Filled 2013-10-11 (×2): qty 1

## 2013-10-11 MED ORDER — ACETAMINOPHEN 325 MG PO TABS: 650.0000 mg | ORAL_TABLET | Freq: Four times a day (QID) | ORAL | Status: DC | PRN

## 2013-10-11 MED ORDER — ZIPRASIDONE MESYLATE 20 MG IM SOLR
20.0000 mg | Freq: Once | INTRAMUSCULAR | Status: AC
  Administered 2013-10-11: 20 mg via INTRAMUSCULAR
  Filled 2013-10-11: qty 20

## 2013-10-11 MED ORDER — FLUOXETINE HCL 20 MG PO CAPS
20.0000 mg | ORAL_CAPSULE | Freq: Every day | ORAL | Status: DC
  Administered 2013-10-11: 20 mg via ORAL
  Filled 2013-10-11: qty 1

## 2013-10-11 MED ORDER — HYDROXYZINE HCL 25 MG PO TABS
25.0000 mg | ORAL_TABLET | Freq: Four times a day (QID) | ORAL | Status: DC | PRN
Start: 2013-10-11 — End: 2013-10-13
  Administered 2013-10-11 – 2013-10-12 (×2): 25 mg via ORAL
  Filled 2013-10-11: qty 56
  Filled 2013-10-11 (×2): qty 1

## 2013-10-11 MED ORDER — MAGNESIUM HYDROXIDE 400 MG/5ML PO SUSP: 30.0000 mL | Freq: Every day | ORAL | Status: DC | PRN

## 2013-10-11 MED ORDER — ALUM & MAG HYDROXIDE-SIMETH 200-200-20 MG/5ML PO SUSP: 30.0000 mL | ORAL | Status: DC | PRN

## 2013-10-11 NOTE — Tx Team (Signed)
Initial Interdisciplinary Treatment Plan  PATIENT STRENGTHS: (choose at least two) Capable of independent living Communication skills Supportive family/friends  PATIENT STRESSORS: Financial difficulties Occupational concerns   PROBLEM LIST: Problem List/Patient Goals Date to be addressed Date deferred Reason deferred Estimated date of resolution  Depression 10/11/2013     SI 10/11/2013                                                DISCHARGE CRITERIA:  Ability to meet basic life and health needs Motivation to continue treatment in a less acute level of care Need for constant or close observation no longer present Safe-care adequate arrangements made Verbal commitment to aftercare and medication compliance  PRELIMINARY DISCHARGE PLAN: Outpatient therapy Return to previous living arrangement  PATIENT/FAMIILY INVOLVEMENT: This treatment plan has been presented to and reviewed with the patient, Shawn Schaefer.  Cresenciano GenreCaroline Evans Charmin Aguiniga 10/11/2013, 7:01 PM
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
## 2013-10-11 NOTE — Progress Notes (Signed)
Adult Psychoeducational Group Note  Date:  10/11/2013 Time:  9:25 PM  Group Topic/Focus:  Wrap-Up Group:   The focus of this group is to help patients review their daily goal of treatment and discuss progress on daily workbooks.  Pt did not attend group, when asked by staff he stated that he did not want to attend and he went back to his room;  Kysha Muralles 10/11/2013, 9:25 PM

## 2013-10-11 NOTE — [facility] Counselor (Signed)
[facility]. Upon chart review, pt's IVC paperwork was incorrect. Writer discussed w/ Assunta FoundShuvon Rankin NP who sts that pt can sign into [facility] as [facility]. Pt's IVC rescinded by Dr. Ladona Ridgelaylor. Patient signed voluntary admission form. Support [facility]. [employer] placed in pt's chart.  Evette Cristalaroline Paige Kellen Hover, [place] Assessment Counselor
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
## 2013-10-11 NOTE — ED Notes (Signed)
Pt calm and cooperative. Pt woke up to eat lunch.Pt asked how he could get ahold of his family to let them know he was here. It was explained he could use the phone at the nurses station at any time.
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
## 2013-10-11 NOTE — Progress Notes (Signed)
[facility] CL provided pt with a [employer] Orange Card application, highlighting [facility].  °

## 2013-10-11 NOTE — ED Notes (Signed)
Pt has in belonging bag:  Gray long sleeves shirt, Blue jeans, Northwest AirlinesBrown boots, white socks, gray boxers, silver plated necklaces, silver plated ring, and one earring with a blue top

## 2013-10-11 NOTE — Consult Note (Signed)
Face to face evaluation and I agree with this note 

## 2013-10-11 NOTE — ED Notes (Signed)
Pt aaox3.  Pt calm and cooperative at this time.  Pt standing at nurses station using the phone.

## 2013-10-11 NOTE — Progress Notes (Signed)
Patient recommended for inpatient treatment by psychiatrist. Pt recommended for 500 hall bed or another placement.   Byrd HesselbachKristen Marckus Hanover, LCSW [phone]  ED CSW 10/11/2013 1056am

## 2013-10-11 NOTE — Progress Notes (Signed)
Pt [facility] hall bed [facility]   Byrd HesselbachKristen Ariabella Brien, LCSW [phone]  ED CSW 10/11/2013 1423pm

## 2013-10-11 NOTE — ED Notes (Signed)
Patient denies SI, HI, AVH at present. Reports passive SI and passive TH. Contracts for safety on the unit. Rates anxiety 8/10, depression 10/10. Reports back and headache 5/5.   Encouragement offered. Cooperative; resting quietly.  Patient safety maintained, Q 15 checks in place.

## 2013-10-12 DIAGNOSIS — F411 Generalized anxiety disorder: Secondary | ICD-10-CM

## 2013-10-12 DIAGNOSIS — F39 Unspecified mood [affective] disorder: Secondary | ICD-10-CM

## 2013-10-12 DIAGNOSIS — F4010 Social phobia, unspecified: Secondary | ICD-10-CM | POA: Diagnosis present

## 2013-10-12 MED ORDER — NICOTINE POLACRILEX 2 MG MT GUM
2.0000 mg | CHEWING_GUM | OROMUCOSAL | Status: DC | PRN
  Administered 2013-10-12 (×2): 2 mg via ORAL
  Filled 2013-10-12 (×2): qty 1

## 2013-10-12 NOTE — [facility] Group Notes (Signed)
[facility] LCSW Group Therapy  10/12/2013   1:15 PM   Type of Therapy:  Group Therapy  Participation Level:  Active  Participation Quality:  Attentive, Sharing and Supportive  Affect:  Depressed and Flat  Cognitive:  Alert and Oriented  Insight:  Developing/Improving and Engaged  Engagement in Therapy:  Developing/Improving and Engaged  Modes of Intervention:  Activity, Clarification, Confrontation, Discussion, Education, Exploration, Limit-setting, Orientation, Problem-solving, Rapport Building, Reality Testing, Socialization and Support  Summary of Progress/Problems: Patient was attentive and engaged with speaker from [employer].  Patient was attentive to speaker while they shared their story of dealing with mental health and overcoming it.  Patient expressed interest in their programs and services and received information on their agency.  Patient processed ways they can relate to the speaker.     Jolan Mealor Horton, LCSW 10/12/2013  2:30 PM   

## 2013-10-12 NOTE — Progress Notes (Signed)
Patient ID: Shawn Schaefer, male   DOB: 12/17/1980, 33 y.o.   MRN: [id number] D: pt. 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 Pt. Expressed an interest in starting his own business, but says it take money about $6000 to start and he don't have the money. Pt. Currently denies SHI. A: Writer introduced self to client and provided emotional support encouraged to continue thoughts of owning his own business but recognize he may have to resort to Plan B while getting to Plan A, encouraged to seek other employment although maybe not be his field of expertise. Encouraged to remain hopeful while seeking other avenues. Writer smiles and agrees. Staff will monitor q7215min for safety. R: Pt. Is safe on the unit, attends group.

## 2013-10-12 NOTE — Progress Notes (Signed)
D:  Patient's self inventory sheet, patient needs sleep medication, poor appetite, normal energy level, improving attention span.  Rated depression and hopeless #8, anxiety #7.  Denied withdrawals.  SI off/on, contracts for safety.  Physical problems in the past 24 hours, headache, pain.  Worst pain #5 in past 24 hours.  Plans to go to counseling after discharge.  No discharge plans.  Needs financial assistance with medications after discharge. A:  Medications administered per MD orders.  Emotional support and encouragement given patient. R:  SI off/on, contracts for safety.  Denied HI.  Denied A/V hallucinations.  Will continue to monitor patient for safety with 15 minute checks.  Safety maintained.
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
## 2013-10-12 NOTE — [facility] Suicide Risk Assessment (Signed)
Suicide Risk Assessment  Admission Assessment     Nursing information obtained from:    Demographic factors:    Current Mental Status:    Loss Factors:    Historical Factors:    Risk Reduction Factors:    Total Time spent with patient: 1 hour  CLINICAL FACTORS:   Severe Anxiety and/or Agitation Depression:   Insomnia COGNITIVE FEATURES THAT CONTRIBUTE TO RISK:  Closed-mindedness Polarized thinking Thought constriction (tunnel vision)    SUICIDE RISK:   Moderate:   PLAN OF CARE: Supportive approach/coping skills/relapse prevention                               Trial with Prozac/Lamictal  I certify that inpatient services furnished can reasonably be expected to improve the patient's condition.  Rachael Feerving A Asia Dusenbury 10/12/2013, 7:27 PM
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
## 2013-10-12 NOTE — Progress Notes (Signed)
The focus of this group is to educate the patient on the purpose and policies of crisis stabilization and provide a format to answer questions about their admission.  The group details unit policies and expectations of patients while admitted.  Patient did not attend 0900 nurse education orientation group this morning.  Patient stayed in bed sleeping, later talked to MD.

## 2013-10-12 NOTE — Progress Notes (Signed)
Adult Psychoeducational Group Note  Date:  10/12/2013 Time:  10:00am Group Topic/Focus:  Personal Choices and Values:   The focus of this group is to help patients assess and explore the importance of values in their lives, how their values affect their decisions, how they express their values and what opposes their expression.  Participation Level:    Participation Quality:    Affect:    Cognitive:   Insight:   Engagement in Group:    Modes of Intervention:    Additional Comments:  Pt did not attend nor  participate in group.   Pryor Curiaanya D Garner 10/12/2013, 11:38 AM

## 2013-10-12 NOTE — [facility] Suicide Risk Assessment (Signed)
[facility] INPATIENT:  Family/Significant Other Suicide Prevention Education  Suicide Prevention Education:  Education Completed; Buck MamSharon Vanengen - wife [phone]),  (name of family member/significant other) has been identified by the patient as the family member/significant other with whom the patient will be residing, and identified as the person(s) who will aid the patient in the event of a mental health crisis (suicidal ideations/suicide attempt).  With written consent from the patient, the family member/significant other has been provided the following suicide prevention education, prior to the and/or following the discharge of the patient.  The suicide prevention education provided includes the following:  Suicide risk factors  Suicide prevention and interventions  National Suicide Hotline telephone number  [facility] assessment telephone number  [facility] Assistance 911  [facility] and/or Residential Mobile Crisis Unit telephone number  Request made of family/significant other to:  Remove weapons (e.g., guns, rifles, knives), all items previously/currently identified as safety concern.    Remove drugs/medications (over-the-counter, prescriptions, illicit drugs), all items previously/currently identified as a safety concern.  The family member/significant other verbalizes understanding of the suicide prevention education information provided.  The family member/significant other agrees to remove the items of safety concern listed above.  Avanni Turnbaugh N Horton 10/12/2013, 2:29 PM

## 2013-10-13 DIAGNOSIS — F329 Major depressive disorder, single episode, unspecified: Secondary | ICD-10-CM

## 2013-10-13 MED ORDER — LAMOTRIGINE 25 MG PO TABS: 25.0000 mg | ORAL_TABLET | Freq: Every day | ORAL | Status: DC

## 2013-10-13 MED ORDER — TRAZODONE HCL 50 MG PO TABS: ORAL_TABLET | ORAL | Status: DC

## 2013-10-13 MED ORDER — FLUOXETINE HCL 20 MG PO CAPS: 20.0000 mg | ORAL_CAPSULE | Freq: Every day | ORAL | Status: DC

## 2013-10-13 MED ORDER — LITHIUM CARBONATE 300 MG PO CAPS: 300.0000 mg | ORAL_CAPSULE | Freq: Two times a day (BID) | ORAL | Status: DC

## 2013-10-13 MED ORDER — LAMOTRIGINE 25 MG PO TABS
25.0000 mg | ORAL_TABLET | Freq: Once | ORAL | Status: AC
  Administered 2013-10-13: 25 mg via ORAL
  Filled 2013-10-13: qty 1

## 2013-10-13 MED ORDER — LITHIUM CARBONATE 300 MG PO CAPS
300.0000 mg | ORAL_CAPSULE | Freq: Two times a day (BID) | ORAL | Status: DC
  Filled 2013-10-13: qty 1
  Filled 2013-10-13 (×2): qty 28
  Filled 2013-10-13: qty 1

## 2013-10-13 MED ORDER — LAMOTRIGINE 25 MG PO TABS
25.0000 mg | ORAL_TABLET | Freq: Every day | ORAL | Status: DC
  Filled 2013-10-13: qty 14
  Filled 2013-10-13 (×2): qty 1

## 2013-10-13 MED ORDER — LITHIUM CARBONATE 300 MG PO CAPS
300.0000 mg | ORAL_CAPSULE | Freq: Once | ORAL | Status: AC
  Administered 2013-10-13: 300 mg via ORAL
  Filled 2013-10-13: qty 1

## 2013-10-13 MED ORDER — HYDROXYZINE HCL 25 MG PO TABS: 25.0000 mg | ORAL_TABLET | Freq: Four times a day (QID) | ORAL | Status: DC | PRN

## 2013-10-13 NOTE — [facility] Counselor (Signed)
Adult Comprehensive Assessment  Patient ID: Hassan BucklerJames B Crago, male   DOB: 02/02/1981, 33 y.o.   MRN: [id number]   Pt not completed due to pt discharging within 72 hours.    Tekla Malachowski N Horton. 10/13/2013

## 2013-10-13 NOTE — Progress Notes (Signed)
D:  Patient's self inventory sheet, patient sleeps well, poor appetite, low energy level, improving attention span.  Rated depression 8, hopeless 5, anxiety 10.  Denied withdrawals.  Denied SI.  Denied physical problems.  Plans to go to mom's house after discharge.  No problems with meds after discharge. A:  Medications administered per MD orders.  Emotional support and encouragement given patient. R:  Denied SI and HI.  Denied A/V hallucinations.  Denied pain.  Will continue to monitor patient for safety 15 minutes checks.  Safety maintained. [facility]

## 2013-10-13 NOTE — Progress Notes (Signed)
[facility] Discharge [facility] :  Will you be returning to the same living situation after discharge: Yes,  returning home At discharge, do you have transportation home?:Yes,  wife will pick pt up Do you have the ability to pay for your medications:Yes,  provided pt with prescriptions and pt verbalizes ability to afford meds  Release of information consent forms completed and in the chart;  Patient's signature needed at discharge.  Patient to Follow up at: Follow-up Information   [facility]. (Arrive at 8:45 am on this date to start intensive outpatient program.  Groups are Monday - Friday, 9 am - 12 pm)    Contact information:   [address] [place], [place] [address] Phone: [phone]      Patient denies SI/HI:   Yes,  denies SI/HI    Safety Planning and Suicide Prevention discussed:  Yes,  discussed with pt and pt's wife.  See suicide prevention education note.   Tameem Pullara N Horton 10/13/2013, 10:34 AM

## 2013-10-13 NOTE — Progress Notes (Signed)
Discharge Note:  Patient discharged home.  Denied SI and HI.  Denied A/V hallucinations.  Denied pain.  Suicide prevention information given and discussed with patient who stated he understood and had no questions.  Patient stated he received all his belongings, clothing, toiletries, misc items, prescriptions, medications, jewelry, boots.  Patient stated he appreciated all assistance [facility].
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
## 2013-10-13 NOTE — [facility] Suicide Risk Assessment (Signed)
Suicide Risk Assessment  Discharge Assessment     Demographic Factors:  Male, Caucasian and Unemployed  Total Time spent with patient: 1 hour  Psychiatric Specialty Exam:     Blood pressure 124/84, pulse 66, temperature 97.6 F (36.4 C), temperature source Oral, resp. rate 18, height 5\' 9"  (1.753 m), weight 70.308 kg (155 lb).Body mass index is 22.88 kg/(m^2).  General Appearance: Fairly Groomed  Patent attorneyye Contact::  Fair  Speech:  Clear and Coherent  Volume:  Decreased  Mood:  Anxious and worried  Affect:  Appropriate  Thought Process:  Coherent and Goal Directed  Orientation:  Full (Time, Place, and Person)  Thought Content:  symptoms, worries, concerns  Suicidal Thoughts:  No  Homicidal Thoughts:  No  Memory:  Immediate;   Fair Recent;   Fair Remote;   Fair  Judgement:  Fair  Insight:  Present  Psychomotor Activity:  restless  Concentration:  Fair  Recall:  F[employer]Fair  Fund of Knowledge:NA  Language: Fair  Akathisia:  No  Handed:    AIMS (if indicated):     Assets:  Desire for Improvement Housing Social Support Vocational/Educational  Sleep:  Number of Hours: 6.75    Musculoskeletal: Strength & Muscle Tone: within normal limits Gait & Station: normal Patient leans: N/A   Mental Status Per Nursing Assessment::   On Admission:     Current Mental Status by Physician: In full contact with reality. There are no active suicidal ideas, plans or intent. He is requesting to be D/C home today so he can go to his friend's funeral this afternoon. He admitted early on to social anxiety that gets in the way of his ability to function. He is wanting to address that. Due to his social anxiety he feels uncomfortable around big groups and unfamiliar environments. He is committed to pursue out patient follow up. He will be coming to the Mental Health IOP starting Monday AM. There is a mood component that after reassessment we decided to start addressing with Lithium/Lamictal. He is  committed and he is actively participating in this treatment plan. He states he needs to go to the funeral to get closure. He is comfortable with the Prozac as he has taken it before and felt it help   Loss Factors: Loss of significant relationship and Financial problems/change in socioeconomic status  Historical Factors: NA  Risk Reduction Factors:   Responsible for children under 31 years of age, Sense of responsibility to family, Religious beliefs about death, Living with another person, especially a relative and Positive social support  Continued Clinical Symptoms:  Depression:   Insomnia  Cognitive Features That Contribute To Risk:  Polarized thinking Thought constriction (tunnel vision)    Suicide Risk:  Minimal: No identifiable suicidal ideation.  Patients presenting with no risk factors but with morbid ruminations; may be classified as minimal risk based on the severity of the depressive symptoms  Discharge Diagnoses:   AXIS I:  Major Depression recurrent, Social Anxiety, Mood Disorder NOS AXIS II:  Deferred AXIS III:   Past Medical History  Diagnosis Date  . Suicide attempt   . Depression    AXIS IV:  other psychosocial or environmental problems AXIS V:  61-70 mild symptoms  Plan Of Care/Follow-up recommendations:  Activity:  as tolerated Diet:  regular Follow up Mental Health IOP monday Is patient on multiple antipsychotic therapies at discharge:  No   Has Patient had three or more failed trials of antipsychotic monotherapy by history:  No  Recommended  Plan for Multiple Antipsychotic Therapies: NA    Rachael FeeIrving A Letrell Attwood 10/13/2013, 11:29 AM

## 2013-10-13 NOTE — Tx Team (Signed)
Interdisciplinary Treatment Plan Update (Adult)  Date: 10/13/2013  Time Reviewed:  9:45 AM  Progress in Treatment: Attending groups: Yes Participating in groups:  Yes Taking medication as prescribed:  Yes Tolerating medication:  Yes Family/Significant othe contact made: Yes, with pt's wife Patient understands diagnosis:  Yes Discussing patient identified problems/goals with staff:  Yes Medical problems stabilized or resolved:  Yes Denies suicidal/homicidal ideation: Yes Issues/concerns per patient self-inventory:  Yes Other:  New problem(s) identified: N/A  Discharge Plan or Barriers: [facility].    Reason for Continuation of Hospitalization: Stable to d/c today  Comments: N/A  Estimated length of stay: D/C today  For review of initial/current patient goals, please see plan of care.  Attendees: Patient:     Family:     Physician:  10/13/2013 10:18 AM   Nursing:   Audie Boxoniecia Byrd, RN 10/13/2013 10:18 AM   Clinical Social Worker:  Reyes Ivanhelsea Horton, LCSW 10/13/2013 10:18 AM   Other: Verne SpurrNeil Mashburn, PA 10/13/2013 10:18 AM   Other:  Sherrye PayorValerie Noch, care coordination 10/13/2013 10:18 AM   Other:  Juline PatchQuylle Hodnett, LCSW 10/13/2013 10:18 AM   Other:  Quintella ReichertBeverly Knight, RN 10/13/2013 10:18 AM   Other: Onnie BoerJennifer Clark, case manager 10/13/2013 10:19 AM   Other:    Other:    Other:    Other:    Other:     Scribe for Treatment Team:   Carmina MillerHorton, Kelsha Older Nicole, 10/13/2013 10:18 AM

## 2013-10-13 NOTE — [facility] Group Notes (Signed)
[facility]   10/13/2013  8:45 AM  Participation Quality:  Did Not Attend - pt refusing to go to groups due to social anxiety  [name], LCSW 10/13/2013 9:33 AM

## 2013-10-16 ENCOUNTER — Other Ambulatory Visit (HOSPITAL_COMMUNITY): Payer: BC Managed Care – PPO | Attending: Psychiatry | Admitting: Psychiatry

## 2013-10-16 ENCOUNTER — Encounter (HOSPITAL_COMMUNITY): Payer: Self-pay | Admitting: Psychiatry

## 2013-10-16 ENCOUNTER — Telehealth (HOSPITAL_COMMUNITY): Payer: Self-pay | Admitting: Psychiatry

## 2013-10-16 DIAGNOSIS — F909 Attention-deficit hyperactivity disorder, unspecified type: Secondary | ICD-10-CM | POA: Insufficient documentation

## 2013-10-16 DIAGNOSIS — F411 Generalized anxiety disorder: Secondary | ICD-10-CM | POA: Diagnosis not present

## 2013-10-16 DIAGNOSIS — F332 Major depressive disorder, recurrent severe without psychotic features: Secondary | ICD-10-CM | POA: Insufficient documentation

## 2013-10-16 DIAGNOSIS — F988 Other specified behavioral and emotional disorders with onset usually occurring in childhood and adolescence: Secondary | ICD-10-CM

## 2013-10-16 DIAGNOSIS — F331 Major depressive disorder, recurrent, moderate: Secondary | ICD-10-CM

## 2013-10-16 MED ORDER — METHYLPHENIDATE HCL ER (OSM) 54 MG PO TBCR: 54.0000 mg | EXTENDED_RELEASE_TABLET | Freq: Every day | ORAL | Status: DC
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
## 2013-10-16 NOTE — Progress Notes (Signed)
    Daily Group Progress Note  Program: IOP  Group Time: 9:00-10:30  Participation Level: Active  Behavioral Response: Appropriate  Type of Therapy:  Group Therapy  Summary of Progress: Pt. Met with psychiatrist and case manager.     Group Time: 10:30-12:00  Participation Level:  Active  Behavioral Response: Appropriate  Type of Therapy: Psycho-education Group  Summary of Progress: Pt. Met with psychiatrist and case manager.  Iyana Topor B, COUNS 

## 2013-10-16 NOTE — Telephone Encounter (Signed)
D:  Pt phoned and stated that he didn't want to continue in [facility].  Pt is requesting names of psychiatrists (outside of [facility]).  States that Dr. Dub MikesLugo had already given him a name of a therapist Merlyn Albert([facility], [facility]).  This writer attempted to encourage pt to try the groups tomorrow, but failed.  Mention that his chart will be kept open until Wednesday, just in case he changes his mind.  A:  Provided pt with support.  Was given names and numbers of psychiatrists.  Will keep patient's chart open for a couple of days in case he changes his mind.  Inform Dr. Rutherford Limerickadepalli.  R:  Pt receptive.

## 2013-10-17 ENCOUNTER — Other Ambulatory Visit (HOSPITAL_COMMUNITY): Payer: BC Managed Care – PPO | Admitting: Psychiatry

## 2013-10-18 ENCOUNTER — Ambulatory Visit (HOSPITAL_COMMUNITY): Payer: Self-pay

## 2013-10-18 NOTE — Progress Notes (Signed)
Patient Discharge Instructions:  Next Level Care Provider Has Access to the EMR, 10/18/13 Records provided to [facility] via [facility]/Epic access.  Jerelene ReddenSheena E Avoca, 10/18/2013, 1:35 PM

## 2013-10-19 ENCOUNTER — Ambulatory Visit (HOSPITAL_COMMUNITY): Payer: Self-pay

## 2013-10-20 ENCOUNTER — Ambulatory Visit (HOSPITAL_COMMUNITY): Payer: Self-pay
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
## 2013-10-21 ENCOUNTER — Encounter: Payer: Self-pay | Admitting: Emergency Medicine

## 2013-10-21 ENCOUNTER — Ambulatory Visit: Payer: Self-pay | Admitting: Emergency Medicine

## 2013-10-21 VITALS — BP 114/80 | HR 60 | Temp 98.3°F | Resp 16 | Ht 69.0 in | Wt 163.0 lb

## 2013-10-21 DIAGNOSIS — Z0289 Encounter for other administrative examinations: Secondary | ICD-10-CM

## 2013-10-21 NOTE — Progress Notes (Signed)
[id number] glucose-neg protein-neg blood-neg

## 2013-10-21 NOTE — Progress Notes (Signed)
[facility] [address], [place] [address] [phone]  Date:  10/21/2013   Name:  Shawn Schaefer   DOB:  03/13/1981   MRN:  [id number]  PCP:  No primary provider on file.    Chief Complaint: dot physical   History of Present Illness:  Shawn Schaefer is a 33 y.o. very pleasant male patient who presents with the following:  DOT certification  Patient Active Problem List   Diagnosis Date Noted  . Social anxiety disorder 10/12/2013  . MDD (major depressive disorder) 10/11/2013  . Major depressive disorder, recurrent episode 09/21/2011    Class: Acute  . Substance abuse/dependence 09/21/2011    Class: Chronic  . Suicidal behavior 09/20/2011  . Depression 09/20/2011  . Anger 09/20/2011  . Substance abuse 09/20/2011    Past Medical History  Diagnosis Date  . Suicide attempt   . Depression   . Anxiety     No past surgical history on file.  History  Substance Use Topics  . Smoking status: Former Smoker    Types: Cigarettes  . Smokeless tobacco: Not on file  . Alcohol Use: 7.2 oz/week    12 Cans of beer per week     Comment: Pt reported that he consumed 2 bottles of wine & pint of wine    No family history on file.  No Known Allergies  Medication list has been reviewed and updated.  Current Outpatient Prescriptions on File Prior to Visit  Medication Sig Dispense Refill  . FLUoxetine (PROZAC) 20 MG capsule Take 1 capsule (20 mg total) by mouth daily.  30 capsule  0  . hydrOXYzine (ATARAX/VISTARIL) 25 MG tablet Take 1 tablet (25 mg total) by mouth every 6 (six) hours as needed for anxiety.  30 tablet  0  . lamoTRIgine (LAMICTAL) 25 MG tablet Take 1 tablet (25 mg total) by mouth daily.  30 tablet  0  . lithium carbonate 300 MG capsule Take 1 capsule (300 mg total) by mouth 2 (two) times daily with a meal.  60 capsule  0  . methylphenidate (CONCERTA) 54 MG CR tablet Take 1 tablet (54 mg total) by mouth daily.  30 tablet  0  . traZODone  (DESYREL) 50 MG tablet Take one tablet at bedtime for insomnia.  30 tablet  0   No current facility-administered medications on file prior to visit.    Review of Systems:  As per HPI, otherwise negative.    Physical Examination: Filed Vitals:   10/21/13 1322  BP: 114/80  Pulse: 60  Temp: 98.3 F (36.8 C)  Resp: 16   Filed Vitals:   10/21/13 1322  Height: 5\' 9"  (1.753 m)  Weight: 163 lb (73.936 kg)   Body mass index is 24.06 kg/(m^2). Ideal Body Weight: Weight in (lb) to have BMI = 25: 168.9  GEN: WDWN, NAD, Non-toxic, A & O x 3 HEENT: Atraumatic, Normocephalic. Neck supple. No masses, No LAD. Ears and Nose: No external deformity. CV: RRR, No M/G/R. No JVD. No thrill. No extra heart sounds. PULM: CTA B, no wheezes, crackles, rhonchi. No retractions. No resp. distress. No accessory muscle use. ABD: S, NT, ND, +BS. No rebound. No HSM. EXTR: No c/c/e NEURO Normal gait.  PSYCH: Normally interactive. Conversant. Not depressed or anxious appearing.  Calm demeanor.    Assessment and Plan: DOT certification  Signed,  Phillips OdorJeffery Micheala Morissette, MD

## 2013-10-23 ENCOUNTER — Ambulatory Visit (HOSPITAL_COMMUNITY): Payer: Self-pay

## 2013-10-24 ENCOUNTER — Ambulatory Visit (HOSPITAL_COMMUNITY): Payer: Self-pay

## 2013-10-25 ENCOUNTER — Ambulatory Visit (HOSPITAL_COMMUNITY): Payer: Self-pay

## 2013-10-25 NOTE — Patient Instructions (Signed)
Pt didn't return to [facility]-IOP due to obtaining a new job.  Provided pt with phone numbers and names for follow-up.

## 2013-10-25 NOTE — Progress Notes (Unsigned)
ELAZAR ARGABRIGHT is a 33 y.o. married, Caucasian male, who was transitioned from the inpt unit [facility]). Pt was admitted from 10-11-13 until 10-13-13. Was admitted due to depressive, anxiety symptoms with SI (plan: Elbert Ewings himself, starve himself, or wreck car). Reported that his deterrent is his family. Discussed safety options with pt. Pt was able to contract for safety. Denied HI or A/V hallucinations. Admitted to paranoia. Pt stated he felt that people were out to harm him. Other symptoms included: Tearfulness, anhedonia, excessive worrying, racing thoughts, feelings of hopelessness, helplessness, worthlessness, irritability, poor sleep (difficulty falling asleep), decreased appetite (lost 10 lbs within thirty days), and poor concentration. Admits to a hx of anger outbursts; most recently last Tuesday when he punched a wall and broke a spatula. Triggers/Stressors: 1) Unemployed for four years. 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 2) Financial Strain.  Pt didn't return to MH-IOP for groups; only attended one day (met with this Probation officer and Dr. Salem Senate).  Pt's wife phoned and left vm that pt wouldn't be returning due to starting a new job.  Pt later called to confirm this. Denied SI/HI or A/V hallucinations.  Provided pt with phone numbers and names for follow-up.  Encouraged support groups.  A:  D/C pt today.  F/U with approved providers of choice.  R:  Pt receptive.

## 2013-10-26 ENCOUNTER — Ambulatory Visit (HOSPITAL_COMMUNITY): Payer: Self-pay

## 2013-10-26 NOTE — Progress Notes (Signed)
Discharge Note  Patient:  Shawn Schaefer is an 33 y.o., male DOB:  10/24/1980  Date of Admission:  10/16/13  Date of Discharge:  10/25/13  Reason for Admission: Depression and anxiety  Hospital Course:32 y.o. married, Caucasian male, who was transitioned from the [facility]pt unit [facility]). Pt was admitted from 10-11-13 until 10-13-13. Was admitted due to depressive, anxiety symptoms with SI (plan: Elbert Ewings himself, starve himself, or wreck car). Reported that his deterrent is his family. Discussed safety options with pt. Pt was able to contract for safety. Denied HI or A/V hallucinations. Admitted to paranoia. Pt stated he felt that people were out to harm him. Other symptoms included: Tearfulness, anhedonia, excessive worrying, racing thoughts, feelings of hopelessness, helplessness, worthlessness, irritability, poor sleep (difficulty falling asleep), decreased appetite (lost 10 lbs within thirty days), and poor concentration. Admits to a hx of anger outbursts; most recently last Tuesday when he punched a wall and broke a spatula. Triggers/Stressors: 1) Unemployed for four years. 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 2) Financial Strain.  Patient started IOP and was started on Concerta 54 mg every morning for his ADHD. He was continued on Prozac 20 mg, Vistaril 25 mg every 6 when necessary, Lamictal 25 mg every day, lithium 300 mg twice a day and trazodone 50 mg every day. He tolerated the medications well and had no problems then decided he did not want to continue with IOP.  Pt didn't return to MH-IOP for groups; only attended one day (met with this Probation officer and Dr. Salem Senate). Pt's wife phoned and left vm that pt wouldn't be returning due to starting a new job. Pt later called to confirm this.  Denied SI/HI or A/V hallucinations. Provided pt with phone numbers and names for follow-up.  Encouraged support groups. A: D/C pt today. F/U with approved providers of choice. R: Pt receptive.   Mental Status at Discharge: Done on the telephone, patient was alert oriented x3, mood was stable speech and language were normal no suicidal or homicidal ideation was present no hallucinations or delusions were noted. Recent and remote memory was good, judgment and insight were good, concentration she stated had improved with the medicine recall was good.  Lab Results: No results found for this or any previous visit (from the past 48 hour(s)).  Current outpatient prescriptions:FLUoxetine (PROZAC) 20 MG capsule, Take 1 capsule (20 mg total) by mouth daily., Disp: 30 capsule, Rfl: 0;  hydrOXYzine (ATARAX/VISTARIL) 25 MG tablet, Take 1 tablet (25 mg total) by mouth every 6 (six) hours as needed for anxiety., Disp: 30 tablet, Rfl: 0;  lamoTRIgine (LAMICTAL) 25 MG tablet, Take 1 tablet (25 mg total) by mouth daily., Disp: 30 tablet, Rfl: 0 lithium carbonate 300 MG capsule, Take 1 capsule (300 mg total) by mouth 2 (two) times daily with a meal., Disp: 60 capsule, Rfl: 0;  methylphenidate (CONCERTA) 54 MG CR tablet, Take 1 tablet (54 mg total) by mouth daily., Disp: 30 tablet, Rfl: 0;  traZODone (DESYREL) 50 MG tablet, Take one tablet at bedtime for insomnia., Disp: 30 tablet, Rfl: 0  Axis Diagnosis:   Axis I: ADHD, combined type, Anxiety Disorder NOS and Major Depression, Recurrent severe Axis II: Cluster C Traits Axis III:  Past Medical History  Diagnosis Date  . Suicide attempt   . Depression   . Anxiety  Axis IV: economic problems, occupational problems, problems related to social environment and problems with primary support group Axis V: 61-70 mild symptoms   Level of Care:  OP  Discharge destination:  Home  Is patient on multiple antipsychotic therapies at discharge:  No    Has Patient had three or more failed trials of antipsychotic monotherapy by history:  No  Patient phone:   [phone] (home)  Patient address:   [place] [address],   Follow-up recommendations:  Activity:  As tolerated Diet:  Regular Other:  Followup [place] Dr. Bryn G[place] and [place]  Comments:  None  The patient received suicide prevention pamphlet:  Yes   Leonides Grills 10/26/2013, 1:00 PM

## 2013-10-27 ENCOUNTER — Ambulatory Visit (HOSPITAL_COMMUNITY): Payer: Self-pay

## 2013-10-30 ENCOUNTER — Ambulatory Visit (HOSPITAL_COMMUNITY): Payer: Self-pay

## 2013-10-31 ENCOUNTER — Ambulatory Visit (HOSPITAL_COMMUNITY): Payer: Self-pay

## 2013-11-01 ENCOUNTER — Ambulatory Visit (HOSPITAL_COMMUNITY): Payer: Self-pay

## 2013-11-02 ENCOUNTER — Ambulatory Visit (HOSPITAL_COMMUNITY): Payer: Self-pay

## 2013-11-03 ENCOUNTER — Ambulatory Visit (HOSPITAL_COMMUNITY): Payer: Self-pay

## 2013-11-06 ENCOUNTER — Ambulatory Visit (HOSPITAL_COMMUNITY): Payer: Self-pay

## 2015-02-27 ENCOUNTER — Encounter (HOSPITAL_COMMUNITY): Payer: Self-pay | Admitting: *Deleted

## 2015-02-27 ENCOUNTER — Inpatient Hospital Stay (HOSPITAL_COMMUNITY)
Admission: EM | Admit: 2015-02-27 | Discharge: 2015-03-02 | DRG: 872 | Disposition: A | Discharge status: Home / Self Care | Payer: Medicaid Other | Attending: Internal Medicine | Admitting: Internal Medicine

## 2015-02-27 ENCOUNTER — Emergency Department (HOSPITAL_COMMUNITY): Payer: Medicaid Other

## 2015-02-27 ENCOUNTER — Inpatient Hospital Stay (HOSPITAL_COMMUNITY): Payer: Medicaid Other

## 2015-02-27 DIAGNOSIS — S90561A Insect bite (nonvenomous), right ankle, initial encounter: Secondary | ICD-10-CM | POA: Diagnosis present

## 2015-02-27 DIAGNOSIS — R0602 Shortness of breath: Secondary | ICD-10-CM

## 2015-02-27 DIAGNOSIS — E872 Acidosis, unspecified: Secondary | ICD-10-CM

## 2015-02-27 DIAGNOSIS — S90562A Insect bite (nonvenomous), left ankle, initial encounter: Secondary | ICD-10-CM | POA: Diagnosis present

## 2015-02-27 DIAGNOSIS — R109 Unspecified abdominal pain: Secondary | ICD-10-CM | POA: Diagnosis present

## 2015-02-27 DIAGNOSIS — F329 Major depressive disorder, single episode, unspecified: Secondary | ICD-10-CM | POA: Diagnosis present

## 2015-02-27 DIAGNOSIS — I517 Cardiomegaly: Secondary | ICD-10-CM | POA: Diagnosis present

## 2015-02-27 DIAGNOSIS — H53149 Visual discomfort, unspecified: Secondary | ICD-10-CM | POA: Diagnosis present

## 2015-02-27 DIAGNOSIS — R509 Fever, unspecified: Secondary | ICD-10-CM

## 2015-02-27 DIAGNOSIS — Z87891 Personal history of nicotine dependence: Secondary | ICD-10-CM

## 2015-02-27 DIAGNOSIS — R680 Hypothermia, not associated with low environmental temperature: Secondary | ICD-10-CM | POA: Diagnosis present

## 2015-02-27 DIAGNOSIS — A419 Sepsis, unspecified organism: Principal | ICD-10-CM | POA: Diagnosis present

## 2015-02-27 DIAGNOSIS — Z791 Long term (current) use of non-steroidal anti-inflammatories (NSAID): Secondary | ICD-10-CM | POA: Diagnosis not present

## 2015-02-27 DIAGNOSIS — R6520 Severe sepsis without septic shock: Secondary | ICD-10-CM | POA: Diagnosis present

## 2015-02-27 DIAGNOSIS — Z915 Personal history of self-harm: Secondary | ICD-10-CM

## 2015-02-27 DIAGNOSIS — F419 Anxiety disorder, unspecified: Secondary | ICD-10-CM | POA: Diagnosis present

## 2015-02-27 DIAGNOSIS — D72829 Elevated white blood cell count, unspecified: Secondary | ICD-10-CM

## 2015-02-27 DIAGNOSIS — F1910 Other psychoactive substance abuse, uncomplicated: Secondary | ICD-10-CM

## 2015-02-27 DIAGNOSIS — R519 Headache, unspecified: Secondary | ICD-10-CM

## 2015-02-27 DIAGNOSIS — R197 Diarrhea, unspecified: Secondary | ICD-10-CM

## 2015-02-27 DIAGNOSIS — I4891 Unspecified atrial fibrillation: Secondary | ICD-10-CM | POA: Diagnosis present

## 2015-02-27 DIAGNOSIS — G43909 Migraine, unspecified, not intractable, without status migrainosus: Secondary | ICD-10-CM | POA: Diagnosis present

## 2015-02-27 DIAGNOSIS — I729 Aneurysm of unspecified site: Secondary | ICD-10-CM | POA: Insufficient documentation

## 2015-02-27 DIAGNOSIS — T68XXXA Hypothermia, initial encounter: Secondary | ICD-10-CM

## 2015-02-27 DIAGNOSIS — R51 Headache: Secondary | ICD-10-CM

## 2015-02-27 LAB — CBC
HEMATOCRIT: 45.3 % (ref 39.0–52.0)
HEMATOCRIT: 45.2 % (ref 39.0–52.0)
HEMOGLOBIN: 15.6 g/dL (ref 13.0–17.0)
Hemoglobin: 15.5 g/dL (ref 13.0–17.0)
MCH: 29.4 pg (ref 26.0–34.0)
MCH: 29.2 pg (ref 26.0–34.0)
MCHC: 34.4 g/dL (ref 30.0–36.0)
MCHC: 34.3 g/dL (ref 30.0–36.0)
MCV: 85.3 fL (ref 78.0–100.0)
MCV: 85.1 fL (ref 78.0–100.0)
PLATELETS: 187 10*3/uL (ref 150–400)
Platelets: 180 10*3/uL (ref 150–400)
RBC: 5.31 MIL/uL (ref 4.22–5.81)
RBC: 5.31 MIL/uL (ref 4.22–5.81)
RDW: 12.7 % (ref 11.5–15.5)
RDW: 12.8 % (ref 11.5–15.5)
WBC: 17.3 10*3/uL — ABNORMAL HIGH (ref 4.0–10.5)
WBC: 17.1 10*3/uL — ABNORMAL HIGH (ref 4.0–10.5)

## 2015-02-27 LAB — COMPREHENSIVE METABOLIC PANEL
ALBUMIN: 4.3 g/dL (ref 3.5–5.0)
ALT: 29 U/L (ref 17–63)
ANION GAP: 12 (ref 5–15)
AST: 26 U/L (ref 15–41)
Alkaline Phosphatase: 62 U/L (ref 38–126)
BUN: 14 mg/dL (ref 6–20)
CO2: 22 mmol/L (ref 22–32)
Calcium: 9 mg/dL (ref 8.9–10.3)
Chloride: 105 mmol/L (ref 101–111)
Creatinine, Ser: 0.83 mg/dL (ref 0.61–1.24)
GFR calc Af Amer: >60 mL/min (ref >60)
GFR calc non Af Amer: >60 mL/min (ref >60)
GLUCOSE: 175 mg/dL — AB (ref 65–99)
POTASSIUM: 3.9 mmol/L (ref 3.5–5.1)
SODIUM: 139 mmol/L (ref 135–145)
Total Bilirubin: 0.6 mg/dL (ref 0.3–1.2)
Total Protein: 6.7 g/dL (ref 6.5–8.1)

## 2015-02-27 LAB — TSH: TSH: 1.538 u[IU]/mL (ref 0.350–4.500)

## 2015-02-27 LAB — BASIC METABOLIC PANEL
Anion gap: 13 (ref 5–15)
Anion gap: 11 (ref 5–15)
BUN: 14 mg/dL (ref 6–20)
BUN: 9 mg/dL (ref 6–20)
CALCIUM: 9 mg/dL (ref 8.9–10.3)
CALCIUM: 7.9 mg/dL — AB (ref 8.9–10.3)
CHLORIDE: 108 mmol/L (ref 101–111)
CO2: 24 mmol/L (ref 22–32)
CO2: 18 mmol/L — AB (ref 22–32)
CREATININE: 0.74 mg/dL (ref 0.61–1.24)
CREATININE: 0.64 mg/dL (ref 0.61–1.24)
Chloride: 103 mmol/L (ref 101–111)
GFR calc Af Amer: >60 mL/min (ref >60)
GFR calc Af Amer: >60 mL/min (ref >60)
GFR calc non Af Amer: >60 mL/min (ref >60)
GLUCOSE: 161 mg/dL — AB (ref 65–99)
Glucose, Bld: 201 mg/dL — ABNORMAL HIGH (ref 65–99)
Potassium: 3.8 mmol/L (ref 3.5–5.1)
Potassium: 3.7 mmol/L (ref 3.5–5.1)
Sodium: 140 mmol/L (ref 135–145)
Sodium: 137 mmol/L (ref 135–145)

## 2015-02-27 LAB — URINALYSIS, ROUTINE W REFLEX MICROSCOPIC
BILIRUBIN URINE: NEGATIVE
Glucose, UA: 250 mg/dL — AB
Hgb urine dipstick: NEGATIVE
KETONES UR: NEGATIVE mg/dL
LEUKOCYTES UA: NEGATIVE
NITRITE: NEGATIVE
Protein, ur: NEGATIVE mg/dL
SPECIFIC GRAVITY, URINE: 1.021 (ref 1.005–1.030)
UROBILINOGEN UA: 0.2 mg/dL (ref 0.0–1.0)
pH: 6.5 (ref 5.0–8.0)

## 2015-02-27 LAB — I-STAT CG4 LACTIC ACID, ED
LACTIC ACID, VENOUS: 4.18 mmol/L — AB (ref 0.5–2.0)
LACTIC ACID, VENOUS: 1.44 mmol/L (ref 0.5–2.0)

## 2015-02-27 LAB — PROTIME-INR
INR: 1.01 (ref 0.00–1.49)
PROTHROMBIN TIME: 13.5 s (ref 11.6–15.2)

## 2015-02-27 LAB — MAGNESIUM
Magnesium: 1.9 mg/dL (ref 1.7–2.4)
Magnesium: 1.9 mg/dL (ref 1.7–2.4)

## 2015-02-27 LAB — ETHANOL

## 2015-02-27 LAB — SALICYLATE LEVEL

## 2015-02-27 LAB — HEPATIC FUNCTION PANEL
ALT: 28 U/L (ref 17–63)
AST: 27 U/L (ref 15–41)
Albumin: 4.3 g/dL (ref 3.5–5.0)
Alkaline Phosphatase: 64 U/L (ref 38–126)
BILIRUBIN DIRECT: 0.1 mg/dL (ref 0.1–0.5)
BILIRUBIN INDIRECT: 0.4 mg/dL (ref 0.3–0.9)
BILIRUBIN TOTAL: 0.5 mg/dL (ref 0.3–1.2)
Total Protein: 6.5 g/dL (ref 6.5–8.1)

## 2015-02-27 LAB — CSF CELL COUNT WITH DIFFERENTIAL
EOS CSF: NONE SEEN % (ref 0–1)
RBC Count, CSF: 31 /mm3 — ABNORMAL HIGH
Segmented Neutrophils-CSF: NONE SEEN % (ref 0–6)
TUBE #: 1
WBC, CSF: 0 /mm3 (ref 0–5)

## 2015-02-27 LAB — PROTEIN AND GLUCOSE, CSF
GLUCOSE CSF: 95 mg/dL — AB (ref 40–70)
TOTAL PROTEIN, CSF: 38 mg/dL (ref 15–45)

## 2015-02-27 LAB — ACETAMINOPHEN LEVEL

## 2015-02-27 LAB — I-STAT TROPONIN, ED: TROPONIN I, POC: 0 ng/mL (ref 0.00–0.08)

## 2015-02-27 LAB — LACTIC ACID, PLASMA: Lactic Acid, Venous: 1.7 mmol/L (ref 0.5–2.0)

## 2015-02-27 LAB — TROPONIN I
TROPONIN I: 0.03 ng/mL (ref <0.031)
Troponin I: 0.11 ng/mL — ABNORMAL HIGH (ref <0.031)

## 2015-02-27 LAB — CK TOTAL AND CKMB (NOT AT ARMC)
CK, MB: 3.2 ng/mL (ref 0.5–5.0)
Relative Index: INVALID (ref 0.0–2.5)
Total CK: 63 U/L (ref 49–397)

## 2015-02-27 LAB — MRSA PCR SCREENING: MRSA by PCR: NEGATIVE

## 2015-02-27 LAB — LIPASE, BLOOD
LIPASE: 21 U/L — AB (ref 22–51)
LIPASE: 22 U/L (ref 22–51)

## 2015-02-27 LAB — PROCALCITONIN: Procalcitonin: <0.1 ng/mL

## 2015-02-27 LAB — PHOSPHORUS: Phosphorus: 1.6 mg/dL — ABNORMAL LOW (ref 2.5–4.6)

## 2015-02-27 LAB — AMYLASE: AMYLASE: 30 U/L (ref 28–100)

## 2015-02-27 LAB — GLUCOSE, CAPILLARY: Glucose-Capillary: 117 mg/dL — ABNORMAL HIGH (ref 65–99)

## 2015-02-27 MED ORDER — PROMETHAZINE HCL 25 MG/ML IJ SOLN
12.5000 mg | Freq: Four times a day (QID) | INTRAMUSCULAR | Status: DC | PRN
  Administered 2015-02-27: 25 mg via INTRAVENOUS
  Filled 2015-02-27 (×2): qty 1

## 2015-02-27 MED ORDER — DEXTROSE 5 % IV SOLN: 2.0000 g | INTRAVENOUS | Status: DC

## 2015-02-27 MED ORDER — HEPARIN SODIUM (PORCINE) 5000 UNIT/ML IJ SOLN
5000.0000 [IU] | Freq: Three times a day (TID) | INTRAMUSCULAR | Status: DC
  Administered 2015-02-27 – 2015-03-02 (×8): 5000 [IU] via SUBCUTANEOUS
  Filled 2015-02-27 (×8): qty 1

## 2015-02-27 MED ORDER — DILTIAZEM HCL 100 MG IV SOLR
5.0000 mg/h | INTRAVENOUS | Status: DC
  Administered 2015-02-27: 5 mg/h via INTRAVENOUS
  Filled 2015-02-27: qty 100

## 2015-02-27 MED ORDER — SODIUM CHLORIDE 0.9 % IV SOLN: INTRAVENOUS | Status: DC

## 2015-02-27 MED ORDER — SODIUM CHLORIDE 0.9 % IV BOLUS (SEPSIS)
500.0000 mL | INTRAVENOUS | Status: AC
  Administered 2015-02-27: 500 mL via INTRAVENOUS

## 2015-02-27 MED ORDER — PIPERACILLIN-TAZOBACTAM 3.375 G IVPB 30 MIN
3.3750 g | Freq: Once | INTRAVENOUS | Status: AC
  Administered 2015-02-27: 3.375 g via INTRAVENOUS
  Filled 2015-02-27: qty 50

## 2015-02-27 MED ORDER — PIPERACILLIN-TAZOBACTAM 3.375 G IVPB: 3.3750 g | Freq: Three times a day (TID) | INTRAVENOUS | Status: DC

## 2015-02-27 MED ORDER — HYDROCORTISONE 0.5 % EX CREA
TOPICAL_CREAM | Freq: Two times a day (BID) | CUTANEOUS | Status: DC
  Administered 2015-03-01: 10:00:00 via TOPICAL
  Filled 2015-02-27 (×2): qty 28.35

## 2015-02-27 MED ORDER — SODIUM CHLORIDE 0.9 % IV BOLUS (SEPSIS)
1000.0000 mL | Freq: Once | INTRAVENOUS | Status: AC
  Administered 2015-02-27: 1000 mL via INTRAVENOUS

## 2015-02-27 MED ORDER — PANTOPRAZOLE SODIUM 40 MG IV SOLR
40.0000 mg | Freq: Every day | INTRAVENOUS | Status: DC
  Administered 2015-02-27: 40 mg via INTRAVENOUS
  Filled 2015-02-27 (×2): qty 40

## 2015-02-27 MED ORDER — SODIUM CHLORIDE 0.9 % IV BOLUS (SEPSIS)
2000.0000 mL | Freq: Once | INTRAVENOUS | Status: AC
  Administered 2015-02-27: 2000 mL via INTRAVENOUS

## 2015-02-27 MED ORDER — VANCOMYCIN HCL IN DEXTROSE 1-5 GM/200ML-% IV SOLN
1000.0000 mg | Freq: Three times a day (TID) | INTRAVENOUS | Status: DC
  Administered 2015-02-27 – 2015-03-02 (×8): 1000 mg via INTRAVENOUS
  Filled 2015-02-27 (×11): qty 200

## 2015-02-27 MED ORDER — SODIUM CHLORIDE 0.9 % IV SOLN
2.0000 g | INTRAVENOUS | Status: DC
Start: 2015-02-27 — End: 2015-02-28
  Administered 2015-02-27 – 2015-02-28 (×4): 2 g via INTRAVENOUS
  Filled 2015-02-27 (×7): qty 2000

## 2015-02-27 MED ORDER — LORAZEPAM 2 MG/ML IJ SOLN
2.0000 mg | Freq: Once | INTRAMUSCULAR | Status: AC
  Administered 2015-02-27: 2 mg via INTRAVENOUS
  Filled 2015-02-27: qty 1

## 2015-02-27 MED ORDER — DIPHENHYDRAMINE HCL 50 MG/ML IJ SOLN
25.0000 mg | Freq: Once | INTRAMUSCULAR | Status: AC
  Administered 2015-02-27: 25 mg via INTRAVENOUS
  Filled 2015-02-27: qty 1

## 2015-02-27 MED ORDER — ONDANSETRON HCL 4 MG/2ML IJ SOLN
4.0000 mg | Freq: Four times a day (QID) | INTRAMUSCULAR | Status: DC | PRN
  Administered 2015-02-27: 4 mg via INTRAVENOUS
  Filled 2015-02-27: qty 2

## 2015-02-27 MED ORDER — VANCOMYCIN HCL IN DEXTROSE 1-5 GM/200ML-% IV SOLN
1000.0000 mg | Freq: Once | INTRAVENOUS | Status: AC
  Administered 2015-02-27: 1000 mg via INTRAVENOUS
  Filled 2015-02-27: qty 200

## 2015-02-27 MED ORDER — SODIUM CHLORIDE 0.9 % IV SOLN: 250.0000 mL | INTRAVENOUS | Status: DC | PRN

## 2015-02-27 MED ORDER — MAGNESIUM SULFATE 2 GM/50ML IV SOLN
2.0000 g | Freq: Once | INTRAVENOUS | Status: AC
  Administered 2015-02-27: 2 g via INTRAVENOUS
  Filled 2015-02-27: qty 50

## 2015-02-27 MED ORDER — IOHEXOL 300 MG/ML  SOLN: 25.0000 mL | Freq: Once | INTRAMUSCULAR | Status: DC | PRN

## 2015-02-27 MED ORDER — DEXAMETHASONE SODIUM PHOSPHATE 10 MG/ML IJ SOLN
0.1500 mg/kg | Freq: Four times a day (QID) | INTRAMUSCULAR | Status: DC
  Administered 2015-02-28 (×2): 12 mg via INTRAVENOUS
  Filled 2015-02-27 (×8): qty 1.2

## 2015-02-27 MED ORDER — DILTIAZEM LOAD VIA INFUSION
10.0000 mg | Freq: Once | INTRAVENOUS | Status: AC
  Administered 2015-02-27: 10 mg via INTRAVENOUS
  Filled 2015-02-27: qty 10

## 2015-02-27 MED ORDER — SODIUM CHLORIDE 0.9 % IV SOLN
INTRAVENOUS | Status: DC
  Administered 2015-02-27 – 2015-02-28 (×3): via INTRAVENOUS

## 2015-02-27 MED ORDER — ACETAMINOPHEN 325 MG PO TABS
650.0000 mg | ORAL_TABLET | Freq: Four times a day (QID) | ORAL | Status: DC | PRN
Start: 2015-02-27 — End: 2015-03-02
  Administered 2015-02-27: 650 mg via ORAL
  Filled 2015-02-27: qty 2

## 2015-02-27 MED ORDER — DEXTROSE 5 % IV SOLN
2.0000 g | Freq: Two times a day (BID) | INTRAVENOUS | Status: DC
  Administered 2015-02-27 – 2015-03-02 (×6): 2 g via INTRAVENOUS
  Filled 2015-02-27 (×8): qty 2

## 2015-02-27 NOTE — ED Notes (Signed)
Lumbar puncture began, Dr. Jeraldine Loots stand by assist.

## 2015-02-27 NOTE — Progress Notes (Signed)
ANTIBIOTIC CONSULT NOTE - INITIAL  Pharmacy Consult for vancomycin + zosyn Indication: rule out sepsis  No Known Allergies  Patient Measurements: Height:  (175.3 cm) Weight: 165 lb (74.844 kg) IBW/kg (Calculated) : 70.7 Adjusted Body Weight:   Vital Signs: Temp: 97 F (36.1 C) (09/07 0952) Temp Source: Rectal (09/07 0952) BP: 134/85 mmHg (09/07 1045) Pulse Rate: 89 (09/07 1045) Intake/Output from previous day:   Intake/Output from this shift:    Labs:  Recent Labs  02/27/15 1041  WBC 17.3*  HGB 15.6  PLT 180  CREATININE 0.83   Estimated Creatinine Clearance: 127.8 mL/min (by C-G formula based on Cr of 0.83). No results for input(s): VANCOTROUGH, VANCOPEAK, VANCORANDOM, GENTTROUGH, GENTPEAK, GENTRANDOM, TOBRATROUGH, TOBRAPEAK, TOBRARND, AMIKACINPEAK, AMIKACINTROU, AMIKACIN in the last 72 hours.   Microbiology: Recent Results (from the past 720 hour(s))  Culture, blood (routine x 2)     Status: None (Preliminary result)   Collection Time: 02/27/15 10:10 AM  Result Value Ref Range Status   Specimen Description BLOOD RIGHT HAND  Final   Special Requests BOTTLES DRAWN AEROBIC ONLY 2CC  Final   Culture PENDING  Incomplete   Report Status PENDING  Incomplete  Culture, blood (routine x 2)     Status: None (Preliminary result)   Collection Time: 02/27/15 10:15 AM  Result Value Ref Range Status   Specimen Description BLOOD LEFT HAND  Final   Special Requests BOTTLES DRAWN AEROBIC ONLY 2CC  Final   Culture PENDING  Incomplete   Report Status PENDING  Incomplete    Medical History: Past Medical History  Diagnosis Date  . Suicide attempt   . Depression   . Anxiety     Medications:  Anti-infectives    Start     Dose/Rate Route Frequency Ordered Stop   02/27/15 2000  vancomycin (VANCOCIN) IVPB 1000 mg/200 mL premix     1,000 mg 200 mL/hr over 60 Minutes Intravenous Every 8 hours 02/27/15 1146     02/27/15 1800  piperacillin-tazobactam (ZOSYN) IVPB 3.375 g      3.375 g 12.5 mL/hr over 240 Minutes Intravenous Every 8 hours 02/27/15 1146     02/27/15 1145  piperacillin-tazobactam (ZOSYN) IVPB 3.375 g     3.375 g 100 mL/hr over 30 Minutes Intravenous  Once 02/27/15 1130     02/27/15 1145  vancomycin (VANCOCIN) IVPB 1000 mg/200 mL premix     1,000 mg 200 mL/hr over 60 Minutes Intravenous  Once 02/27/15 1130       Assessment: 34 yom presented to the ED with headache and afib. To start empiric vancomycin + zosyn for possible sepsis. Pt is afebrile but WBC is elevated at 17.3. Lactic acid is also elevated at 4.18. First doses ordered by EDP.   Vanc 9/7>> Zosyn 9/7>>  Goal of Therapy:  Vancomycin trough level 15-20 mcg/ml  Plan:  - Zosyn 3.375gm IV Q8H (4 hr inf) - Vancomycin 1gm IV Q8H - F/u renal fxn, C&S, clinical status and trough at [facility]  Sharlie Shreffler, Drake Leach 02/27/2015,11:46 AM

## 2015-02-27 NOTE — Progress Notes (Signed)
eLink Physician-Brief Progress Note Patient Name: COURTEZ TWADDLE DOB: [birth year] MRN: [id number]   Date of Service  02/27/2015  HPI/Events of Note  Patient c/o headache.   eICU Interventions  Will order Tylenol 650 mg PO Q 6 hours PRN HA.     Intervention Category Intermediate Interventions: Pain - evaluation and management  Jacey Eckerson Eugene 02/27/2015, 9:46 PM

## 2015-02-27 NOTE — ED Notes (Signed)
Dr. Rhona Leavens at bedside attempting US guided IV.

## 2015-02-27 NOTE — ED Notes (Signed)
Bear hugger applied to pt per Dr. Jeraldine Loots.

## 2015-02-27 NOTE — ED Provider Notes (Signed)
CSN: [id number]     Arrival date & time 02/27/15  0946 History   First MD Initiated Contact with Patient 02/27/15 [phone]     Chief Complaint  Patient presents with  . Headache    HPI  34 yo M h/o migraines presents with acute onset nausea/vomiting, headache, fever, and palpitations that started last night. He says this feels like a migraine to him; he has photophobia and occipital headache but denies neck stiffness or meningeal signs. He has not been taking any medications at home, no drugs besides marijuana, and did not overdose on anything, although he does have a history of suicide attempt. He also says he has some chigger bites on his ankles but denies history of tick bites or rash. In the ED, he was found to be in afib with RVR, rates in the 140s, hypothermic to 97 rectally, with lactic acid of 4.0. Sepsis protocol was initiated.  Past Medical History  Diagnosis Date  . Suicide attempt   . Depression   . Anxiety    History reviewed. No pertinent past surgical history. History reviewed. No pertinent family history. Social History  Substance Use Topics  . Smoking status: Former Smoker    Types: Cigarettes  . Smokeless tobacco: None  . Alcohol Use: 7.2 oz/week    12 Cans of beer per week     Comment: Pt reported that he consumed 2 bottles of wine & pint of wine    Review of Systems  Constitutional: Positive for fever and chills.  Respiratory: Positive for chest tightness and shortness of breath. Negative for wheezing.   Cardiovascular: Positive for palpitations. Negative for chest pain.  Gastrointestinal: Positive for nausea and vomiting. Negative for abdominal pain and diarrhea.  Genitourinary: Negative for dysuria.  Musculoskeletal: Negative for neck pain and neck stiffness.  Skin: Negative for rash.  Neurological: Positive for light-headedness and headaches. Negative for dizziness.    Allergies  Review of patient's allergies indicates no known allergies.  Home Medications    Prior to Admission medications   Medication Sig Start Date End Date Taking? Authorizing Provider  FLUoxetine (PROZAC) 20 MG capsule Take 1 capsule (20 mg total) by mouth daily. 10/13/13   Tamala Julian, PA-C  hydrOXYzine (ATARAX/VISTARIL) 25 MG tablet Take 1 tablet (25 mg total) by mouth every 6 (six) hours as needed for anxiety. 10/13/13   Tamala Julian, PA-C  lamoTRIgine (LAMICTAL) 25 MG tablet Take 1 tablet (25 mg total) by mouth daily. 10/13/13   Tamala Julian, PA-C  lithium carbonate 300 MG capsule Take 1 capsule (300 mg total) by mouth 2 (two) times daily with a meal. 10/13/13   Tamala Julian, PA-C  methylphenidate (CONCERTA) 54 MG CR tablet Take 1 tablet (54 mg total) by mouth daily. 10/16/13 10/16/14  Gayland Curry, MD  traZODone (DESYREL) 50 MG tablet Take one tablet at bedtime for insomnia. 10/13/13   Tamala Julian, PA-C   Temp(Src) 97 F (36.1 C) (Rectal)  Ht  (1.753 m)  Wt 165 lb (74.844 kg)  BMI 24.36 kg/m2  SpO2 98%   Physical Exam  Constitutional: He appears distressed.  HENT:  Head: Normocephalic and atraumatic.  Eyes: Conjunctivae and EOM are normal. Pupils are equal, round, and reactive to light.  Neck: Normal range of motion. Neck supple. No JVD present.  Cardiovascular: Normal heart sounds.  An irregularly irregular rhythm present. Tachycardia present.  PMI is not displaced.   No murmur heard. Pulmonary/Chest: Effort normal.  Abdominal: Soft. Bowel sounds are normal.  Musculoskeletal: Normal range of motion.  Skin: Skin is warm. He is diaphoretic.    ED Course  LUMBAR PUNCTURE Date/Time: 02/27/2015 3:41 PM Performed by: Selina Cooley Authorized by: Gerhard Munch Consent: Verbal consent obtained. Written consent not obtained. Risks and benefits: risks, benefits and alternatives were discussed Consent given by: patient Patient understanding: patient states understanding of the procedure being performed Patient consent: the patient's  understanding of the procedure matches consent given Procedure consent: procedure consent matches procedure scheduled Relevant documents: relevant documents present and verified Test results: test results available and properly labeled Site marked: the operative site was marked Imaging studies: imaging studies available Patient identity confirmed: verbally with patient and arm band Time out: Immediately prior to procedure a "time out" was called to verify the correct patient, procedure, equipment, support staff and site/side marked as required. Indications: evaluation for infection and evaluation for altered mental status Anesthesia: local infiltration Local anesthetic: lidocaine 1% with epinephrine Anesthetic total: 5 ml Patient sedated: yes Sedation type: anxiolysis Sedatives: lorazepam Sedation start date/time: 02/27/2015 3:00 PM Sedation end date/time: 02/27/2015 3:30 PM Preparation: Patient was prepped and draped in the usual sterile fashion. Lumbar space: L4-L5 interspace Patient's position: left lateral decubitus Needle gauge: 18 Needle type: spinal needle - Quincke tip Needle length: 2.5 in Number of attempts: 5 or more Patient tolerance: Patient tolerated the procedure well with no immediate complications   Labs Review Labs Reviewed  CBC - Abnormal; Notable for the following:    WBC 17.3 (*)    All other components within normal limits  URINALYSIS, ROUTINE W REFLEX MICROSCOPIC (NOT AT [facility]) - Abnormal; Notable for the following:    Glucose, UA 250 (*)    All other components within normal limits  LIPASE, BLOOD - Abnormal; Notable for the following:    Lipase 21 (*)    All other components within normal limits  COMPREHENSIVE METABOLIC PANEL - Abnormal; Notable for the following:    Glucose, Bld 175 (*)    All other components within normal limits  ACETAMINOPHEN LEVEL - Abnormal; Notable for the following:    Acetaminophen (Tylenol), Serum <10 (*)    All other components  within normal limits  BASIC METABOLIC PANEL - Abnormal; Notable for the following:    Glucose, Bld 161 (*)    All other components within normal limits  PHOSPHORUS - Abnormal; Notable for the following:    Phosphorus 1.6 (*)    All other components within normal limits  CBC - Abnormal; Notable for the following:    WBC 17.1 (*)    All other components within normal limits  I-STAT CG4 LACTIC ACID, ED - Abnormal; Notable for the following:    Lactic Acid, Venous 4.18 (*)    All other components within normal limits  CULTURE, BLOOD (ROUTINE X 2)  CULTURE, BLOOD (ROUTINE X 2)  TSH  MAGNESIUM  PROCALCITONIN  ETHANOL  SALICYLATE LEVEL  MAGNESIUM  HEPATIC FUNCTION PANEL  LIPASE, BLOOD  PROTIME-INR  LACTIC ACID, PLASMA  AMYLASE  TROPONIN I  CK TOTAL AND CKMB (NOT AT [facility])  DRUGS OF ABUSE SCREEN W ALC, ROUTINE URINE  HIV ANTIBODY (ROUTINE TESTING)  LEGIONELLA ANTIGEN, URINE  STREP PNEUMONIAE URINARY ANTIGEN  ROCKY MTN SPOTTED FVR ABS PNL(IGG+IGM)  TROPONIN I  TROPONIN I  BASIC METABOLIC PANEL  GI PATHOGEN PANEL BY PCR, STOOL  I-STAT TROPOININ, ED  I-STAT CG4 LACTIC ACID, ED    Imaging Review Ct Head Wo Contrast  02/27/2015   CLINICAL DATA:  Headache. Vomiting. Nausea, vomiting, and diarrhea. Atrial fibrillation. History of migraines.  EXAM: CT HEAD WITHOUT CONTRAST  TECHNIQUE: Contiguous axial images were obtained from the base of the skull through the vertex without intravenous contrast.  COMPARISON:  None.  FINDINGS: There is no evidence of acute cortical infarct, intracranial hemorrhage, mass, midline shift, or extra-axial fluid collection. Ventricles and sulci are normal. Gray-white differentiation is preserved.  Orbits are unremarkable. Mild mucosal thickening is partially visualized in the right frontal sinus and both maxillary sinuses. Mastoid air cells are clear. No skull fracture.  IMPRESSION: Unremarkable CT appearance of the brain.   Electronically Signed   By: Sebastian Ache M.D.   On: 02/27/2015 12:25   Dg Chest Port 1 View  02/27/2015   CLINICAL DATA:  Shortness of breath and atrial fibrillation  EXAM: PORTABLE CHEST - 1 VIEW  COMPARISON:  April 13, 2008  FINDINGS: Lungs are clear. Heart size and pulmonary vascularity are normal. No adenopathy. No bone lesions.  IMPRESSION: No edema or consolidation.   Electronically Signed   By: Bretta Bang III M.D.   On: 02/27/2015 12:02   I have personally reviewed and evaluated these images and lab results as part of my medical decision-making.   EKG Interpretation   Date/Time:  Wednesday February 27 2015 09:46:28 EDT Ventricular Rate:  114 PR Interval:    QRS Duration: 93 QT Interval:  321 QTC Calculation: 442 R Axis:   39 Text Interpretation:  Atrial fibrillation LVH by voltage Atrial  fibrillation Artifact Left ventricular hypertrophy Abnormal ekg Confirmed  by Gerhard Munch  MD [phone]) on 02/27/2015 9:48:19 AM      MDM   Final diagnoses:  Sepsis   Mr. Rouch is a 34 year old male with a history of migraines presenting with sepsis of unknown source, suspected to be meningitis due to his only localizing complaint of headache and photophobia, and atrial fibrillation with rapid ventricular response. Regarding his headache and sepsis, I also considered a tick-borne illness such as RMSF  due to his complaint of bug bites; however he denies rash or history of tick bites that he was aware of.  Regarding his afib with RVR, he was hemodynamically stable so he was started on a diltiazem drip and his rates improved to 90s-100s and he converted to sinus rhythm spontaneously. Upon arrival, sepsis protocol was initiated due to his hypothermia to 97, leukocytosis of 16, lactic acid 4, and tachycardia. He was given 2.5L of IVF, blood cultures were obtained, and he was started on vancomycin and piperacillin/tazobactam empirically. His lactic acidosis dropped down to 1 after fluids. We attempted a lumbar puncture given  suspicion for meningitis but were unsuccessful so interventional radiology was called to perform under fluroscopy. He was transferred to the ICU in hemodynamically stable condition for further management.  Selina Cooley, MD 02/27/15 1615  Gerhard Munch, MD 02/27/15 [phone]

## 2015-02-27 NOTE — H&P (Addendum)
PULMONARY / CRITICAL CARE MEDICINE   Name: Shawn Schaefer MRN: [id number] DOB: 1980/08/17    ADMISSION DATE:  02/27/2015 CONSULTATION DATE:  02/27/2015   REFERRING MD :  Dr Jeraldine Loots of ER  CHIEF COMPLAINT:  SEpsis Syndrome   HISTORY OF PRESENT ILLNESS: 34 year old male adopted with baseline biological history unknown. He has a previous history of substance abuse including cocaine and alcohol which apparently according to his history he has been clean for many years. He has one admission for suicidal ideation in 2015 but none since then according to his history. He currently lives at home with his wife and children. His adoptive mom lives nearby. He is quite functional according to his history. He has a history of migraines. According to the ER nurse has not had a migraine episode in many years but according to the critical care nurse practitioner who took direct history from the patient patient endorses frequent recurrent migraines some episodes even lasting 2 weeks.  He reports that 2 weeks ago he was in the yard and got bitten by chiggers and has bite marks in his ankles. He refuses or declines any tick bite history. Most recently 02/26/2015 yesterday had a 1 migraine episode. This was followed by another migraine episode today associated with some nausea, abd pain and vomiting and possibly diarrhea. He presented to the emergency department via EMS. On route sinus tachycardia change in Atrial. Fibrillation with rapid ventricular rate necessitating Cardizem drip in the emergency room. In the emergency department he was found to be hypothermic necessitating a Lawyer and also serves physiology as evidenced by high white count along with high lactic acidosis of 4.0. He continued to have headaches in the emergency department along with photophobia needing a towel over his eyes. However he did no other localizing symptoms of infection or signs according to the emergency department.  Critical care  medicine is asked to evaluate and admit the patient given high concern for occult septic shock and severe sepsis syndrome. In the emergency department he refused and declined lumbar puncture and central line procedure to the critical care medicine service       PAST MEDICAL HISTORY :   has a past medical history of Suicide attempt; Depression; and Anxiety.  has no past surgical history on file. Prior to Admission medications   Medication Sig Start Date End Date Taking? Authorizing Provider  aspirin-acetaminophen-caffeine (EXCEDRIN MIGRAINE) [phone] MG per tablet Take by mouth every 6 (six) hours as needed for headache or migraine.   Yes Historical Provider, MD  ibuprofen (ADVIL,MOTRIN) 200 MG tablet Take 200-400 mg by mouth every 6 (six) hours as needed for headache or mild pain.   Yes Historical Provider, MD   No Known Allergies  FAMILY HISTORY:  has no family status information on file.  SOCIAL HISTORY:  reports that he has quit smoking. His smoking use included Cigarettes. He does not have any smokeless tobacco history on file. He reports that he drinks about 7.2 oz of alcohol per week. He reports that he uses illicit drugs (Marijuana, Benzodiazepines, and Hydrocodone) about once per week.  REVIEW OF SYSTEMS:  Per HPI. NOt fully elicitable given sepsis state   VITAL SIGNS: Temp:  [96.2 F (35.7 C)-97 F (36.1 C)] 96.2 F (35.7 C) (09/07 1155) Pulse Rate:  [70-122] 94 (09/07 1330) Resp:  [11-32] 20 (09/07 1330) BP: (127-164)/(81-94) 145/91 mmHg (09/07 1330) SpO2:  [97 %-100 %] 100 % (09/07 1330) Weight:  [74.844 kg (165 lb)] 74.844 kg (  165 lb) (09/07 0952) HEMODYNAMICS:   VENTILATOR SETTINGS:   INTAKE / OUTPUT: No intake or output data in the 24 hours ending 02/27/15 1352  PHYSICAL EXAMINATION: General:  Well-built young man lying in bed supine Neuro:  Slow to respond but alert and oriented 3. Speech is normal. Seems to get anxious when talked about medical  issues HEENT:  Eyes are covered by a towel to avoid photophobia. Neck is supple. No neck nodes Cardiovascular:  A. fib RVR with heart rate of 130 on Cardizem drip Lungs:  Mild tachypnea. Clear to auscultation bilaterally no wheezes Abdomen:  Scars present. Soft nontender no organomegaly (reported pain to APP) Musculoskeletal:  No cyanosis, no clubbing no edema. Cheek bite marks present bilateral ankles Skin:  Intact other than chigger bite marks  LABS:  PULMONARY No results for input(s): PHART, PCO2ART, PO2ART, HCO3, TCO2, O2SAT in the last 168 hours.  Invalid input(s): PCO2, PO2  CBC  Recent Labs Lab 02/27/15 1034 02/27/15 1041  HGB 15.5 15.6  HCT 45.2 45.3  WBC 17.1* 17.3*  PLT 187 180    COAGULATION  Recent Labs Lab 02/27/15 1034  INR 1.01    CARDIAC  No results for input(s): TROPONINI in the last 168 hours. No results for input(s): PROBNP in the last 168 hours.   CHEMISTRY  Recent Labs Lab 02/27/15 1030 02/27/15 1041  NA  --  139  K  --  3.9  CL  --  105  CO2  --  22  GLUCOSE  --  175*  BUN  --  14  CREATININE  --  0.83  CALCIUM  --  9.0  MG 1.9  --    Estimated Creatinine Clearance: 127.8 mL/min (by C-G formula based on Cr of 0.83).   LIVER  Recent Labs Lab 02/27/15 1034 02/27/15 1041  AST  --  26  ALT  --  29  ALKPHOS  --  62  BILITOT  --  0.6  PROT  --  6.7  ALBUMIN  --  4.3  INR 1.01  --      INFECTIOUS  Recent Labs Lab 02/27/15 1041  LATICACIDVEN 4.18*     ENDOCRINE CBG (last 3)  No results for input(s): GLUCAP in the last 72 hours.       IMAGING x48h  - image(s) personally visualized  -   highlighted in bold Ct Head Wo Contrast  02/27/2015   CLINICAL DATA:  Headache. Vomiting. Nausea, vomiting, and diarrhea. Atrial fibrillation. History of migraines.  EXAM: CT HEAD WITHOUT CONTRAST  TECHNIQUE: Contiguous axial images were obtained from the base of the skull through the vertex without intravenous contrast.   COMPARISON:  None.  FINDINGS: There is no evidence of acute cortical infarct, intracranial hemorrhage, mass, midline shift, or extra-axial fluid collection. Ventricles and sulci are normal. Gray-white differentiation is preserved.  Orbits are unremarkable. Mild mucosal thickening is partially visualized in the right frontal sinus and both maxillary sinuses. Mastoid air cells are clear. No skull fracture.  IMPRESSION: Unremarkable CT appearance of the brain.   Electronically Signed   By: Sebastian Ache M.D.   On: 02/27/2015 12:25   Dg Chest Port 1 View  02/27/2015   CLINICAL DATA:  Shortness of breath and atrial fibrillation  EXAM: PORTABLE CHEST - 1 VIEW  COMPARISON:  April 13, 2008  FINDINGS: Lungs are clear. Heart size and pulmonary vascularity are normal. No adenopathy. No bone lesions.  IMPRESSION: No edema or consolidation.   Electronically Signed  By: Bretta Bang III M.D.   On: 02/27/2015 12:02          ASSESSMENT / PLAN:  PULMONARY Maintaining oxygenation but sepsis syndrome puts him at risk for delirium and ARDS and intubation P:   Close monitoring in the ICU of the respirator state  CARDIOVASCULAR CVL -agree to do it with emergency room physician A:  SIRS, sepsis syndrome, lactic acidosis. No frank shock at admission A. fib RVR P:  Cardizem drip   echocardiogram Cycle enzymes Cards consult if needed Hydrate Tract lactic acid for clearance  RENAL ALactic acidosis at admission 4.0 \ Very mild hypomagnesemia less than 2 g percent  P:   Tract lactic acid after fluid bolus  Replete magnesium Monitor lites closely  GASTROI History of diarrhea at admission  P:   CT abd and pelvis 02/27/2015 Stool multiplex PCR nothing by mouth Monitor closely GI prophylaxis  HEMAT Leukocytosis due to SIRS P:  monitor  INFECTIOUS Blood culture 02/27/2015  urine culture 02/27/2015 Urine Streptococcus and Urine Legionella 02/27/2015 Stool multiplex PCR 02/27/2015 MRSA PCR  02/27/2015 HIV 02/27/2015 Urine drug screen 02/27/2015  Lumbar puncture done by the emergency department 02/27/2015 (patient decline CCM offering this procedure)    A:  Sepsis Syndrome (wsevere) withuot shock. Only localizing feature is his headache with photophobia. Does not have triad of meningitis   P:   Lumbar puncture by the emergency department Zosyn 1 dose given by the ER  Vancomycin meningitis doses high-dose ceftriaxone meningitis dose  check sepsis biomarkers Infectious diseases consultation Dr. Ninetta Lights called   ENDOCRINE A:  Nil acute P:   monitor  NEUROLOGIC A:   History of migraines. Currently withtypical migraine according to his history. Bacterial meningitis in the differential diagnosis  P:   Neurology consultation called by the ER   Dermatology  a: Chigger bites in the ankles  P: Topical hydrocortisone cream twice a day  Psychiatry: A: History of anxiety depression and previous suicide attempts and polysubstance abuse P - Urine drug screen   FAMILY  - Updates:  mom and patient updated at the bedside on admission  02/27/15. Mom wanted that he is at high risk for decompensation   - Inter-disciplinary family meet or Palliative Care meeting due by:  Due by 03/06/15   TODAY'S SUMMARY: admit ICU. Track sepsis biomarkers, neuro and ID consult   The patient is critically ill with multiple organ systems failure and requires high complexity decision making for assessment and support, frequent evaluation and titration of therapies, application of advanced monitoring technologies and extensive interpretation of multiple databases.   Critical Care Time devoted to patient care services described in this note is  60  Minutes. This time reflects time of care of this signee Dr Kalman Shan. This critical care time does not reflect procedure time, or teaching time or supervisory time of PA/NP/Med student/Med Resident etc but could involve care discussion time    Dr.  Kalman Shan, M.D., [facility]C.P Pulmonary and Critical Care Medicine Staff Physician [facility] [place] [facility] Pager: [phone], If no answer or between  15:00h - 7:00h: call [phone]  02/27/2015 2:16 PM

## 2015-02-27 NOTE — Consult Note (Signed)
[facility]  Date of Admission:  02/27/2015  Date of Consult:  02/27/2015  Reason for Consult: sepsis Referring Physician: Chase Caller  Impression/Recommendation Sepsis await his LP results- he does not appear meningitic.  Consider adding steroids.  High dose ceftriaxone, vanco, add ampicillin Consider adding ACV if lymphocyte predominant CSF.  Check RMSF, Ehrlichia- hold on adding doxy- his PLT, LFTs are nl, he does not have a rash.  Send serology for state enterovirus panel Send HSV PCR on CSF Check HIV Ab and PCR  Thank you so much for this interesting consult,   Bobby Rumpf (pager) [phone] [email or web address]  Shawn Schaefer is an 34 y.o. male.  HPI: 34 yo M with hx of substance abuse and migraines.  2 weeks ago he was doing yard work and multiple bug bites on his LE. Over last 24h he has had increasing headaches which are consistent with his previous migraines. He also developed n/v, abd pain and diarrhea. In ED he was hypothermic to 96.2, had WBC of 17.3 and lactic acid of 4.18.  His CT head is unremarkable.    Past Medical History  Diagnosis Date  . Suicide attempt   . Depression   . Anxiety     History reviewed. No pertinent past surgical history.   No Known Allergies  Medications:  Scheduled: . heparin  5,000 Units Subcutaneous 3 times per day  . hydrocortisone cream   Topical BID  . pantoprazole (PROTONIX) IV  40 mg Intravenous QHS    Abtx:  Anti-infectives    Start     Dose/Rate Route Frequency Ordered Stop   02/27/15 2000  vancomycin (VANCOCIN) IVPB 1000 mg/200 mL premix     1,000 mg 200 mL/hr over 60 Minutes Intravenous Every 8 hours 02/27/15 1146     02/27/15 1800  piperacillin-tazobactam (ZOSYN) IVPB 3.375 g  Status:  Discontinued     3.375 g 12.5 mL/hr over 240 Minutes Intravenous Every 8 hours 02/27/15 1146 02/27/15 1413   02/27/15 1430  cefTRIAXone (ROCEPHIN) 2 g in dextrose 5 % 50 mL IVPB     2 g 100  mL/hr over 30 Minutes Intravenous Every 12 hours 02/27/15 1413     02/27/15 1400  cefTRIAXone (ROCEPHIN) 2 g in dextrose 5 % 50 mL IVPB  Status:  Discontinued     2 g 100 mL/hr over 30 Minutes Intravenous Every 24 hours 02/27/15 1345 02/27/15 1413   02/27/15 1145  piperacillin-tazobactam (ZOSYN) IVPB 3.375 g     3.375 g 100 mL/hr over 30 Minutes Intravenous  Once 02/27/15 1130 02/27/15 1217   02/27/15 1145  vancomycin (VANCOCIN) IVPB 1000 mg/200 mL premix     1,000 mg 200 mL/hr over 60 Minutes Intravenous  Once 02/27/15 1130 02/27/15 1400      Total days of antibiotics: 0          Social History:  reports that he has quit smoking. His smoking use included Cigarettes. He does not have any smokeless tobacco history on file. He reports that he drinks about 7.2 oz of alcohol per week. He reports that he uses illicit drugs (Marijuana, Benzodiazepines, and Hydrocodone) about once per week.  History reviewed. No pertinent family history.  General ROS: loose BM today, no dysuria. headaches consistent with his prev. no rash, no sick contacts. no ticks. see HPI, 12 point ROS o/w (-).   Blood pressure 117/80, pulse 83, temperature 97.7 F (36.5 C), temperature source Rectal, resp. rate 19, height 5'  9" (1.753 m), weight 74.844 kg (165 lb), SpO2 99 %. General appearance: alert, cooperative, fatigued and no distress Eyes: negative findings: conjunctivae and sclerae normal, pupils equal, round, reactive to light and accomodation and no photophobia Throat: normal findings: lips normal without lesions and oropharynx pink & moist without lesions or evidence of thrush Neck: no adenopathy, supple, symmetrical, trachea midline and FROM, non-tender Lungs: clear to auscultation bilaterally Heart: regular rate and rhythm Abdomen: normal findings: bowel sounds normal and soft, non-tender Male genitalia: normal findings: no penile lesions Extremities: edema none and no rash on palms or soles.    Results  for orders placed or performed during the hospital encounter of 02/27/15 (from the past 48 hour(s))  Culture, blood (routine x 2)     Status: None (Preliminary result)   Collection Time: 02/27/15 10:10 AM  Result Value Ref Range   Specimen Description BLOOD RIGHT HAND    Special Requests BOTTLES DRAWN AEROBIC ONLY 2CC    Culture PENDING    Report Status PENDING   Culture, blood (routine x 2)     Status: None (Preliminary result)   Collection Time: 02/27/15 10:15 AM  Result Value Ref Range   Specimen Description BLOOD LEFT HAND    Special Requests BOTTLES DRAWN AEROBIC ONLY 2CC    Culture PENDING    Report Status PENDING   Magnesium     Status: None   Collection Time: 02/27/15 10:30 AM  Result Value Ref Range   Magnesium 1.9 1.7 - 2.4 mg/dL  Procalcitonin - Baseline     Status: None   Collection Time: 02/27/15 10:34 AM  Result Value Ref Range   Procalcitonin <0.10 ng/mL    Comment:        Interpretation: PCT (Procalcitonin) <= 0.5 ng/mL: Systemic infection (sepsis) is not likely. Local bacterial infection is possible. (NOTE)         ICU PCT Algorithm               Non ICU PCT Algorithm    ----------------------------     ------------------------------         PCT < 0.25 ng/mL                 PCT < 0.1 ng/mL     Stopping of antibiotics            Stopping of antibiotics       strongly encouraged.               strongly encouraged.    ----------------------------     ------------------------------       PCT level decrease by               PCT < 0.25 ng/mL       >= 80% from peak PCT       OR PCT 0.25 - 0.5 ng/mL          Stopping of antibiotics                                             encouraged.     Stopping of antibiotics           encouraged.    ----------------------------     ------------------------------       PCT level decrease by              PCT >=  0.25 ng/mL       < 80% from peak PCT        AND PCT >= 0.5 ng/mL            Continuin g antibiotics                                               encouraged.       Continuing antibiotics            encouraged.    ----------------------------     ------------------------------     PCT level increase compared          PCT > 0.5 ng/mL         with peak PCT AND          PCT >= 0.5 ng/mL             Escalation of antibiotics                                          strongly encouraged.      Escalation of antibiotics        strongly encouraged.   Ethanol     Status: None   Collection Time: 02/27/15 10:34 AM  Result Value Ref Range   Alcohol, Ethyl (B) <5 <5 mg/dL    Comment:        LOWEST DETECTABLE LIMIT FOR SERUM ALCOHOL IS 5 mg/dL FOR MEDICAL PURPOSES ONLY   Acetaminophen level     Status: Abnormal   Collection Time: 02/27/15 10:34 AM  Result Value Ref Range   Acetaminophen (Tylenol), Serum <10 (L) 10 - 30 ug/mL    Comment:        THERAPEUTIC CONCENTRATIONS VARY SIGNIFICANTLY. A RANGE OF 10-30 ug/mL MAY BE AN EFFECTIVE CONCENTRATION FOR MANY PATIENTS. HOWEVER, SOME ARE BEST TREATED AT CONCENTRATIONS OUTSIDE THIS RANGE. ACETAMINOPHEN CONCENTRATIONS >150 ug/mL AT 4 HOURS AFTER INGESTION AND >50 ug/mL AT 12 HOURS AFTER INGESTION ARE OFTEN ASSOCIATED WITH TOXIC REACTIONS.   Salicylate level     Status: None   Collection Time: 02/27/15 10:34 AM  Result Value Ref Range   Salicylate Lvl <9.8 2.8 - 30.0 mg/dL  Basic metabolic panel     Status: Abnormal   Collection Time: 02/27/15 10:34 AM  Result Value Ref Range   Sodium 140 135 - 145 mmol/L   Potassium 3.8 3.5 - 5.1 mmol/L   Chloride 103 101 - 111 mmol/L   CO2 24 22 - 32 mmol/L   Glucose, Bld 161 (H) 65 - 99 mg/dL   BUN 14 6 - 20 mg/dL   Creatinine, Ser 0.74 0.61 - 1.24 mg/dL   Calcium 9.0 8.9 - 10.3 mg/dL   GFR calc non Af Amer >60 >60 mL/min   GFR calc Af Amer >60 >60 mL/min    Comment: (NOTE) The eGFR has been calculated using the CKD EPI equation. This calculation has not been validated in all clinical situations. eGFR's  persistently <60 mL/min signify possible Chronic Kidney Disease.    Anion gap 13 5 - 15  Magnesium     Status: None   Collection Time: 02/27/15 10:34 AM  Result Value Ref Range   Magnesium 1.9 1.7 - 2.4 mg/dL  Phosphorus  Status: Abnormal   Collection Time: 02/27/15 10:34 AM  Result Value Ref Range   Phosphorus 1.6 (L) 2.5 - 4.6 mg/dL  CBC     Status: Abnormal   Collection Time: 02/27/15 10:34 AM  Result Value Ref Range   WBC 17.1 (H) 4.0 - 10.5 K/uL   RBC 5.31 4.22 - 5.81 MIL/uL   Hemoglobin 15.5 13.0 - 17.0 g/dL   HCT 45.2 39.0 - 52.0 %   MCV 85.1 78.0 - 100.0 fL   MCH 29.2 26.0 - 34.0 pg   MCHC 34.3 30.0 - 36.0 g/dL   RDW 12.8 11.5 - 15.5 %   Platelets 187 150 - 400 K/uL  Hepatic function panel     Status: None   Collection Time: 02/27/15 10:34 AM  Result Value Ref Range   Total Protein 6.5 6.5 - 8.1 g/dL   Albumin 4.3 3.5 - 5.0 g/dL   AST 27 15 - 41 U/L   ALT 28 17 - 63 U/L   Alkaline Phosphatase 64 38 - 126 U/L   Total Bilirubin 0.5 0.3 - 1.2 mg/dL   Bilirubin, Direct 0.1 0.1 - 0.5 mg/dL   Indirect Bilirubin 0.4 0.3 - 0.9 mg/dL  Lipase, blood     Status: None   Collection Time: 02/27/15 10:34 AM  Result Value Ref Range   Lipase 22 22 - 51 U/L  Protime-INR     Status: None   Collection Time: 02/27/15 10:34 AM  Result Value Ref Range   Prothrombin Time 13.5 11.6 - 15.2 seconds   INR 1.01 0.00 - 1.49  I-stat troponin, ED     Status: None   Collection Time: 02/27/15 10:39 AM  Result Value Ref Range   Troponin i, poc 0.00 0.00 - 0.08 ng/mL   Comment 3            Comment: Due to the release kinetics of cTnI, a negative result within the first hours of the onset of symptoms does not rule out myocardial infarction with certainty. If myocardial infarction is still suspected, repeat the test at appropriate intervals.   CBC     Status: Abnormal   Collection Time: 02/27/15 10:41 AM  Result Value Ref Range   WBC 17.3 (H) 4.0 - 10.5 K/uL   RBC 5.31 4.22 - 5.81  MIL/uL   Hemoglobin 15.6 13.0 - 17.0 g/dL   HCT 45.3 39.0 - 52.0 %   MCV 85.3 78.0 - 100.0 fL   MCH 29.4 26.0 - 34.0 pg   MCHC 34.4 30.0 - 36.0 g/dL   RDW 12.7 11.5 - 15.5 %   Platelets 180 150 - 400 K/uL  Lipase, blood     Status: Abnormal   Collection Time: 02/27/15 10:41 AM  Result Value Ref Range   Lipase 21 (L) 22 - 51 U/L  I-Stat CG4 Lactic Acid, ED     Status: Abnormal   Collection Time: 02/27/15 10:41 AM  Result Value Ref Range   Lactic Acid, Venous 4.18 (HH) 0.5 - 2.0 mmol/L   Comment NOTIFIED PHYSICIAN   Comprehensive metabolic panel     Status: Abnormal   Collection Time: 02/27/15 10:41 AM  Result Value Ref Range   Sodium 139 135 - 145 mmol/L   Potassium 3.9 3.5 - 5.1 mmol/L   Chloride 105 101 - 111 mmol/L   CO2 22 22 - 32 mmol/L   Glucose, Bld 175 (H) 65 - 99 mg/dL   BUN 14 6 - 20 mg/dL   Creatinine,  Ser 0.83 0.61 - 1.24 mg/dL   Calcium 9.0 8.9 - 10.3 mg/dL   Total Protein 6.7 6.5 - 8.1 g/dL   Albumin 4.3 3.5 - 5.0 g/dL   AST 26 15 - 41 U/L   ALT 29 17 - 63 U/L   Alkaline Phosphatase 62 38 - 126 U/L   Total Bilirubin 0.6 0.3 - 1.2 mg/dL   GFR calc non Af Amer >60 >60 mL/min   GFR calc Af Amer >60 >60 mL/min    Comment: (NOTE) The eGFR has been calculated using the CKD EPI equation. This calculation has not been validated in all clinical situations. eGFR's persistently <60 mL/min signify possible Chronic Kidney Disease.    Anion gap 12 5 - 15  TSH     Status: None   Collection Time: 02/27/15 10:48 AM  Result Value Ref Range   TSH 1.538 0.350 - 4.500 uIU/mL  Urinalysis, Routine w reflex microscopic     Status: Abnormal   Collection Time: 02/27/15 11:55 AM  Result Value Ref Range   Color, Urine YELLOW YELLOW   APPearance CLEAR CLEAR   Specific Gravity, Urine 1.021 1.005 - 1.030   pH 6.5 5.0 - 8.0   Glucose, UA 250 (A) NEGATIVE mg/dL   Hgb urine dipstick NEGATIVE NEGATIVE   Bilirubin Urine NEGATIVE NEGATIVE   Ketones, ur NEGATIVE NEGATIVE mg/dL    Protein, ur NEGATIVE NEGATIVE mg/dL   Urobilinogen, UA 0.2 0.0 - 1.0 mg/dL   Nitrite NEGATIVE NEGATIVE   Leukocytes, UA NEGATIVE NEGATIVE    Comment: MICROSCOPIC NOT DONE ON URINES WITH NEGATIVE PROTEIN, BLOOD, LEUKOCYTES, NITRITE, OR GLUCOSE <1000 mg/dL.  Lactic acid, plasma     Status: None   Collection Time: 02/27/15  2:06 PM  Result Value Ref Range   Lactic Acid, Venous 1.7 0.5 - 2.0 mmol/L  CK total and CKMB (cardiac)not at [facility]     Status: None   Collection Time: 02/27/15  2:06 PM  Result Value Ref Range   Total CK 63 49 - 397 U/L   CK, MB 3.2 0.5 - 5.0 ng/mL   Relative Index RELATIVE INDEX IS INVALID 0.0 - 2.5    Comment: WHEN CK < 100 U/L          I-Stat CG4 Lactic Acid, ED     Status: None   Collection Time: 02/27/15  2:11 PM  Result Value Ref Range   Lactic Acid, Venous 1.44 0.5 - 2.0 mmol/L  Amylase     Status: None   Collection Time: 02/27/15  2:18 PM  Result Value Ref Range   Amylase 30 28 - 100 U/L  Troponin I     Status: None   Collection Time: 02/27/15  2:18 PM  Result Value Ref Range   Troponin I 0.03 <0.031 ng/mL    Comment:        NO INDICATION OF MYOCARDIAL INJURY.       Component Value Date/Time   SDES BLOOD LEFT HAND 02/27/2015 1015   SPECREQUEST BOTTLES DRAWN AEROBIC ONLY 2CC 02/27/2015 1015   CULT PENDING 02/27/2015 1015   REPTSTATUS PENDING 02/27/2015 1015   Ct Head Wo Contrast  02/27/2015   CLINICAL DATA:  Headache. Vomiting. Nausea, vomiting, and diarrhea. Atrial fibrillation. History of migraines.  EXAM: CT HEAD WITHOUT CONTRAST  TECHNIQUE: Contiguous axial images were obtained from the base of the skull through the vertex without intravenous contrast.  COMPARISON:  None.  FINDINGS: There is no evidence of acute cortical infarct, intracranial hemorrhage,  mass, midline shift, or extra-axial fluid collection. Ventricles and sulci are normal. Gray-white differentiation is preserved.  Orbits are unremarkable. Mild mucosal thickening is partially  visualized in the right frontal sinus and both maxillary sinuses. Mastoid air cells are clear. No skull fracture.  IMPRESSION: Unremarkable CT appearance of the brain.   Electronically Signed   By: Logan Bores M.D.   On: 02/27/2015 12:25   Dg Chest Port 1 View  02/27/2015   CLINICAL DATA:  Shortness of breath and atrial fibrillation  EXAM: PORTABLE CHEST - 1 VIEW  COMPARISON:  April 13, 2008  FINDINGS: Lungs are clear. Heart size and pulmonary vascularity are normal. No adenopathy. No bone lesions.  IMPRESSION: No edema or consolidation.   Electronically Signed   By: Lowella Grip III M.D.   On: 02/27/2015 12:02   Recent Results (from the past 240 hour(s))  Culture, blood (routine x 2)     Status: None (Preliminary result)   Collection Time: 02/27/15 10:10 AM  Result Value Ref Range Status   Specimen Description BLOOD RIGHT HAND  Final   Special Requests BOTTLES DRAWN AEROBIC ONLY 2CC  Final   Culture PENDING  Incomplete   Report Status PENDING  Incomplete  Culture, blood (routine x 2)     Status: None (Preliminary result)   Collection Time: 02/27/15 10:15 AM  Result Value Ref Range Status   Specimen Description BLOOD LEFT HAND  Final   Special Requests BOTTLES DRAWN AEROBIC ONLY 2CC  Final   Culture PENDING  Incomplete   Report Status PENDING  Incomplete      02/27/2015, 4:33 PM     LOS: 0 days    Records and images were personally reviewed where available.

## 2015-02-27 NOTE — ED Notes (Signed)
This RN and HS, RN attempted twice each for IV access. Unable to gain access, informed Dr. Jeraldine Loots and requests ultrasound guided IV.

## 2015-02-27 NOTE — Progress Notes (Signed)
Unit CM UR Completed by [facility] ED CM  W. Camila Norville RN  

## 2015-02-27 NOTE — ED Notes (Addendum)
Dr. Earnest Conroy notified of lactic acid results.

## 2015-02-27 NOTE — ED Notes (Signed)
Pt arrives from home via [facility]. Pt states he has a hx of migraines, but hasn't had migraines "in a while and no longer has immitrex'. Upon arrival pt is in afib rvr. Pt is also having nvd. Dr. Jeraldine Loots notified and at bedside.

## 2015-02-27 NOTE — ED Notes (Signed)
Pt states the medicine made him antsy. Benadryl to be ordered.

## 2015-02-27 NOTE — ED Notes (Signed)
This RN in CT with pt 

## 2015-02-27 NOTE — ED Notes (Addendum)
Xray came to get pt, xray changed to port

## 2015-02-28 ENCOUNTER — Ambulatory Visit (HOSPITAL_BASED_OUTPATIENT_CLINIC_OR_DEPARTMENT_OTHER): Payer: Medicaid Other

## 2015-02-28 DIAGNOSIS — I4891 Unspecified atrial fibrillation: Secondary | ICD-10-CM

## 2015-02-28 DIAGNOSIS — R509 Fever, unspecified: Secondary | ICD-10-CM

## 2015-02-28 DIAGNOSIS — G4489 Other headache syndrome: Secondary | ICD-10-CM

## 2015-02-28 DIAGNOSIS — I729 Aneurysm of unspecified site: Secondary | ICD-10-CM | POA: Insufficient documentation

## 2015-02-28 LAB — CBC
HCT: 39 % (ref 39.0–52.0)
Hemoglobin: 13.3 g/dL (ref 13.0–17.0)
MCH: 28.9 pg (ref 26.0–34.0)
MCHC: 34.1 g/dL (ref 30.0–36.0)
MCV: 84.6 fL (ref 78.0–100.0)
PLATELETS: 186 10*3/uL (ref 150–400)
RBC: 4.61 MIL/uL (ref 4.22–5.81)
RDW: 13 % (ref 11.5–15.5)
WBC: 15.3 10*3/uL — AB (ref 4.0–10.5)

## 2015-02-28 LAB — PROCALCITONIN

## 2015-02-28 LAB — BASIC METABOLIC PANEL
Anion gap: 9 (ref 5–15)
Anion gap: 8 (ref 5–15)
BUN: 6 mg/dL (ref 6–20)
BUN: 10 mg/dL (ref 6–20)
CALCIUM: 8.4 mg/dL — AB (ref 8.9–10.3)
CALCIUM: 9.2 mg/dL (ref 8.9–10.3)
CO2: 24 mmol/L (ref 22–32)
CO2: 25 mmol/L (ref 22–32)
CREATININE: 0.74 mg/dL (ref 0.61–1.24)
Chloride: 105 mmol/L (ref 101–111)
Chloride: 107 mmol/L (ref 101–111)
Creatinine, Ser: 0.95 mg/dL (ref 0.61–1.24)
GFR calc Af Amer: >60 mL/min (ref >60)
GFR calc Af Amer: >60 mL/min (ref >60)
GLUCOSE: 106 mg/dL — AB (ref 65–99)
GLUCOSE: 153 mg/dL — AB (ref 65–99)
Potassium: 3.7 mmol/L (ref 3.5–5.1)
Potassium: 3.9 mmol/L (ref 3.5–5.1)
SODIUM: 138 mmol/L (ref 135–145)
Sodium: 140 mmol/L (ref 135–145)

## 2015-02-28 LAB — SODIUM, URINE, RANDOM: Sodium, Ur: 63 mmol/L

## 2015-02-28 LAB — PHOSPHORUS: PHOSPHORUS: 3.9 mg/dL (ref 2.5–4.6)

## 2015-02-28 LAB — MAGNESIUM: Magnesium: 2 mg/dL (ref 1.7–2.4)

## 2015-02-28 LAB — LEGIONELLA ANTIGEN, URINE

## 2015-02-28 LAB — ROCKY MTN SPOTTED FVR ABS PNL(IGG+IGM)
RMSF IgG: NEGATIVE
RMSF IgM: 0.77 index (ref 0.00–0.89)

## 2015-02-28 LAB — HIV ANTIBODY (ROUTINE TESTING W REFLEX)
HIV SCREEN 4TH GENERATION: NONREACTIVE
HIV Screen 4th Generation wRfx: NONREACTIVE

## 2015-02-28 LAB — STREP PNEUMONIAE URINARY ANTIGEN: STREP PNEUMO URINARY ANTIGEN: NEGATIVE

## 2015-02-28 LAB — TROPONIN I: TROPONIN I: 0.1 ng/mL — AB (ref <0.031)

## 2015-02-28 MED ORDER — OXYCODONE-ACETAMINOPHEN 5-325 MG PO TABS
1.0000 | ORAL_TABLET | ORAL | Status: DC | PRN
  Administered 2015-02-28 (×2): 1 via ORAL
  Administered 2015-03-01 – 2015-03-02 (×4): 2 via ORAL
  Filled 2015-02-28 (×2): qty 2
  Filled 2015-02-28: qty 1
  Filled 2015-02-28: qty 2
  Filled 2015-02-28: qty 1
  Filled 2015-02-28: qty 2

## 2015-02-28 MED ORDER — DIPHENHYDRAMINE HCL 50 MG/ML IJ SOLN
25.0000 mg | Freq: Three times a day (TID) | INTRAMUSCULAR | Status: AC
Start: 2015-02-28 — End: 2015-03-01
  Administered 2015-02-28 – 2015-03-01 (×3): 25 mg via INTRAVENOUS
  Filled 2015-02-28 (×3): qty 1

## 2015-02-28 MED ORDER — IOHEXOL 300 MG/ML  SOLN
80.0000 mL | Freq: Once | INTRAMUSCULAR | Status: AC | PRN
  Administered 2015-02-28: 80 mL via INTRAVENOUS

## 2015-02-28 MED ORDER — OXYCODONE-ACETAMINOPHEN 5-325 MG PO TABS: 2.0000 | ORAL_TABLET | ORAL | Status: DC | PRN

## 2015-02-28 MED ORDER — VALPROATE SODIUM 500 MG/5ML IV SOLN
1000.0000 mg | Freq: Once | INTRAVENOUS | Status: AC
  Administered 2015-02-28: 1000 mg via INTRAVENOUS
  Filled 2015-02-28: qty 10

## 2015-02-28 MED ORDER — PROCHLORPERAZINE EDISYLATE 5 MG/ML IJ SOLN
10.0000 mg | Freq: Three times a day (TID) | INTRAMUSCULAR | Status: AC
Start: 2015-02-28 — End: 2015-03-01
  Administered 2015-02-28 – 2015-03-01 (×3): 10 mg via INTRAVENOUS
  Filled 2015-02-28 (×3): qty 2

## 2015-02-28 MED ORDER — KETOROLAC TROMETHAMINE 30 MG/ML IJ SOLN
30.0000 mg | Freq: Three times a day (TID) | INTRAMUSCULAR | Status: DC
  Administered 2015-02-28 – 2015-03-02 (×6): 30 mg via INTRAVENOUS
  Filled 2015-02-28 (×9): qty 1

## 2015-02-28 NOTE — Progress Notes (Signed)
INFECTIOUS DISEASE PROGRESS NOTE  ID: Shawn Schaefer is a 34 y.o. male with  Active Problems:   Sepsis   Severe sepsis   Lactic acidosis   Headache   Hypothermia  Subjective: No further diarrhea.  C/o headache  Abtx:  Anti-infectives    Start     Dose/Rate Route Frequency Ordered Stop   02/27/15 2000  vancomycin (VANCOCIN) IVPB 1000 mg/200 mL premix     1,000 mg 200 mL/hr over 60 Minutes Intravenous Every 8 hours 02/27/15 1146     02/27/15 2000  ampicillin (OMNIPEN) 2 g in sodium chloride 0.9 % 50 mL IVPB     2 g 150 mL/hr over 20 Minutes Intravenous 6 times per day 02/27/15 1802     02/27/15 1800  piperacillin-tazobactam (ZOSYN) IVPB 3.375 g  Status:  Discontinued     3.375 g 12.5 mL/hr over 240 Minutes Intravenous Every 8 hours 02/27/15 1146 02/27/15 1413   02/27/15 1430  cefTRIAXone (ROCEPHIN) 2 g in dextrose 5 % 50 mL IVPB     2 g 100 mL/hr over 30 Minutes Intravenous Every 12 hours 02/27/15 1413     02/27/15 1400  cefTRIAXone (ROCEPHIN) 2 g in dextrose 5 % 50 mL IVPB  Status:  Discontinued     2 g 100 mL/hr over 30 Minutes Intravenous Every 24 hours 02/27/15 1345 02/27/15 1413   02/27/15 1145  piperacillin-tazobactam (ZOSYN) IVPB 3.375 g     3.375 g 100 mL/hr over 30 Minutes Intravenous  Once 02/27/15 1130 02/27/15 1217   02/27/15 1145  vancomycin (VANCOCIN) IVPB 1000 mg/200 mL premix     1,000 mg 200 mL/hr over 60 Minutes Intravenous  Once 02/27/15 1130 02/27/15 1400      Medications:  Scheduled: . cefTRIAXone (ROCEPHIN)  IV  2 g Intravenous Q12H  . heparin  5,000 Units Subcutaneous 3 times per day  . hydrocortisone cream   Topical BID  . pantoprazole (PROTONIX) IV  40 mg Intravenous QHS  . vancomycin  1,000 mg Intravenous Q8H    Objective: Vital signs in last 24 hours: Temp:  [96.2 F (35.7 C)-98.9 F (37.2 C)] 98 F (36.7 C) (09/08 0728) Pulse Rate:  [63-122] 68 (09/08 0900) Resp:  [11-32] 20 (09/08 0900) BP: (106-164)/(65-94) 136/83 mmHg (09/08  0900) SpO2:  [88 %-100 %] 97 % (09/08 0900) Weight:  [74.844 kg (165 lb)-78.8 kg (173 lb 11.6 oz)] 78.8 kg (173 lb 11.6 oz) (09/08 0500)   General appearance: alert, cooperative and mild distress Eyes: conjunctivae/corneas clear. PERRL, EOM's intact. Fundi benign., mild photophobia Resp: clear to auscultation bilaterally Cardio: regular rate and rhythm GI: normal findings: bowel sounds normal and soft, non-tender  Lab Results  Recent Labs  02/27/15 1041 02/27/15 2011 02/28/15 0245  WBC 17.3*  --  15.3*  HGB 15.6  --  13.3  HCT 45.3  --  39.0  NA 139 137 138  K 3.9 3.7 3.7  CL 105 108 105  CO2 22 18* 24  BUN 14 9 6   CREATININE 0.83 0.64 0.74   Liver Panel  Recent Labs  02/27/15 1034 02/27/15 1041  PROT 6.5 6.7  ALBUMIN 4.3 4.3  AST 27 26  ALT 28 29  ALKPHOS 64 62  BILITOT 0.5 0.6  BILIDIR 0.1  --   IBILI 0.4  --    Sedimentation Rate No results for input(s): ESRSEDRATE in the last 72 hours. C-Reactive Protein No results for input(s): CRP in the last 72 hours.  Microbiology:  Recent Results (from the past 240 hour(s))  Culture, blood (routine x 2)     Status: None (Preliminary result)   Collection Time: 02/27/15 10:10 AM  Result Value Ref Range Status   Specimen Description BLOOD RIGHT HAND  Final   Special Requests BOTTLES DRAWN AEROBIC ONLY 2CC  Final   Culture PENDING  Incomplete   Report Status PENDING  Incomplete  Culture, blood (routine x 2)     Status: None (Preliminary result)   Collection Time: 02/27/15 10:15 AM  Result Value Ref Range Status   Specimen Description BLOOD LEFT HAND  Final   Special Requests BOTTLES DRAWN AEROBIC ONLY 2CC  Final   Culture PENDING  Incomplete   Report Status PENDING  Incomplete  CSF culture with Stat gram stain     Status: None (Preliminary result)   Collection Time: 02/27/15  6:56 PM  Result Value Ref Range Status   Specimen Description CSF  Final   Special Requests Normal  Final   Gram Stain CYTOSPIN SMEAR NO  WBC SEEN NO ORGANISMS SEEN   Final   Culture PENDING  Incomplete   Report Status PENDING  Incomplete  MRSA PCR Screening     Status: None   Collection Time: 02/27/15  7:33 PM  Result Value Ref Range Status   MRSA by PCR NEGATIVE NEGATIVE Final    Comment:        The GeneXpert MRSA Assay (FDA approved for NASAL specimens only), is one component of a comprehensive MRSA colonization surveillance program. It is not intended to diagnose MRSA infection nor to guide or monitor treatment for MRSA infections.     Studies/Results: Ct Head Wo Contrast  02/27/2015   CLINICAL DATA:  Headache. Vomiting. Nausea, vomiting, and diarrhea. Atrial fibrillation. History of migraines.  EXAM: CT HEAD WITHOUT CONTRAST  TECHNIQUE: Contiguous axial images were obtained from the base of the skull through the vertex without intravenous contrast.  COMPARISON:  None.  FINDINGS: There is no evidence of acute cortical infarct, intracranial hemorrhage, mass, midline shift, or extra-axial fluid collection. Ventricles and sulci are normal. Gray-white differentiation is preserved.  Orbits are unremarkable. Mild mucosal thickening is partially visualized in the right frontal sinus and both maxillary sinuses. Mastoid air cells are clear. No skull fracture.  IMPRESSION: Unremarkable CT appearance of the brain.   Electronically Signed   By: Sebastian Ache M.D.   On: 02/27/2015 12:25   Ct Abdomen Pelvis W Contrast  02/27/2015   CLINICAL DATA:  Abdominal pain with nausea and vomiting. Elevated white blood count. Headache.  EXAM: CT ABDOMEN AND PELVIS WITH CONTRAST  TECHNIQUE: Multidetector CT imaging of the abdomen and pelvis was performed using the standard protocol following bolus administration of intravenous contrast.  CONTRAST:  100 cc Omnipaque 300  COMPARISON:  None.  FINDINGS: Lower chest:  Normal.  Hepatobiliary: Normal.  Pancreas: Normal.  Spleen: Normal.  Adrenals/Urinary Tract: Congenital horseshoe kidney. No obstruction.  10 mm cyst on the upper pole of the left side of the horseshoe kidney. Adrenal glands are normal. Bladder is normal.  Stomach/Bowel: Normal including the terminal ileum and appendix.  Vascular/Lymphatic: Normal.  Reproductive: Normal.  Other: No free air or free fluid. There are several small bubbles of air in the spinal canal and in the posterior paraspinal soft tissues, probably from recent lumbar puncture.  Musculoskeletal: Normal.  IMPRESSION: Benign appearing abdomen and pelvis.  Congenital horseshoe kidney.   Electronically Signed   By: Francene Boyers M.D.   On:  02/27/2015 16:59   Dg Chest Port 1 View  02/27/2015   CLINICAL DATA:  Shortness of breath and atrial fibrillation  EXAM: PORTABLE CHEST - 1 VIEW  COMPARISON:  April 13, 2008  FINDINGS: Lungs are clear. Heart size and pulmonary vascularity are normal. No adenopathy. No bone lesions.  IMPRESSION: No edema or consolidation.   Electronically Signed   By: Bretta Bang III M.D.   On: 02/27/2015 12:02   Dg Lumbar Puncture Fluoro Guide  02/27/2015   CLINICAL DATA:  Headache and vomiting. Sepsis. Evaluate for meningitis  EXAM: DIAGNOSTIC LUMBAR PUNCTURE UNDER FLUOROSCOPIC GUIDANCE  FLUOROSCOPY TIME:  Radiation Exposure Index (as provided by the fluoroscopic device): 1.4 mGy entrance dose  If the device does not provide the exposure index:  Fluoroscopy Time (in minutes and seconds):  0.6 minutes  Number of Acquired Images:  0  PROCEDURE: Informed consent was obtained from the patient and his mother prior to the procedure by the ER staff (from prior attempt), including potential complications of headache, allergy, and pain. With the patient prone, the lower back was prepped with Betadine. 1% Lidocaine was used for local anesthesia. Lumbar puncture was performed at the L3-4 level using a 20 gauge needle with return of clear CSF. 11 ml of CSF were obtained for laboratory studies. The patient tolerated the procedure well and there were no apparent  complications.  IMPRESSION: Successful lumbar puncture.   Electronically Signed   By: Marnee Spring M.D.   On: 02/27/2015 17:50     Assessment/Plan: Sepsis Will stop amp, steroids His CSF does not appear consistent with infection Will stop CSF PCR studies Await RMSF, Ehrlichia serologies HIV is (-)  Headache Would consider neuro eval  Loose BM His syndrome could be consistent with enteroviral infection.  However, his CSF is pristine.  Will stop contact precautions.   Total days of antibiotics: 1 amp/vanco/ceftriaxone         Shawn Schaefer Infectious Diseases (pager) [phone] [email or web address] 02/28/2015, 9:20 AM  LOS: 1 day

## 2015-02-28 NOTE — Consult Note (Signed)
NEURO HOSPITALIST CONSULT NOTE   Referring physician: PCCM   Reason for Consult: HA  HPI:                                                                                                                                          Shawn Schaefer is an 34 y.o. male with history of substance abuse (has been clean for years) and migraine HA.  Over the last 48 hours patient developed a HA along with diarrhea, N/V and abdominal pain. In ED he was hypothermic and noted to have a WBC of 17.3.  Head CT was negative.  While in ED he was noted to have Afib with RVR requiring Cardizem.  He is currently being treated for SIRS and found to have Urine Streptococcus and Urine Legionella.  LP was performed in ED and was unremarkable other than 31 RBC, 95 glucose and no growth on prelim. Culture. ID is also currently involved in his care. Neurology was asked to evaluate HA and make recommendations on if further neuroimaging is required.   Patient states this HA started with feeling dizzy followed by above symptoms.  This is very characteristic of his normal migraine.  He has used OTC medications in the past and narcotics which were not his.  Nothing but the narcotics often helps.  His wife states they usually try the OTC medications then often end up in the ED to get medications. Currently he states his HA is a 8/10, throbbing with both photophobia and phonophobia.  No paresthesia or weakness.   Past Medical History  Diagnosis Date  . Suicide attempt   . Depression   . Anxiety     History reviewed. No pertinent past surgical history.   No pertinent family --Adopted   Social History:  reports that he has quit smoking. His smoking use included Cigarettes. He does not have any smokeless tobacco history on file. He reports that he drinks about 7.2 oz of alcohol per week. He reports that he uses illicit drugs (Marijuana, Benzodiazepines, and Hydrocodone) about once per week.  No Known  Allergies  MEDICATIONS:  Prior to Admission:  Prescriptions prior to admission  Medication Sig Dispense Refill Last Dose  . aspirin-acetaminophen-caffeine (EXCEDRIN MIGRAINE) 250-250-65 MG per tablet Take by mouth every 6 (six) hours as needed for headache or migraine.   02/26/2015 at Unknown time  . ibuprofen (ADVIL,MOTRIN) 200 MG tablet Take 200-400 mg by mouth every 6 (six) hours as needed for headache or mild pain.   02/26/2015 at Unknown time   Scheduled: . cefTRIAXone (ROCEPHIN)  IV  2 g Intravenous Q12H  . heparin  5,000 Units Subcutaneous 3 times per day  . hydrocortisone cream   Topical BID  . vancomycin  1,000 mg Intravenous Q8H     ROS:                                                                                                                                       History obtained from the patient  General ROS: negative for - chills, fatigue, fever, night sweats, weight gain or weight loss Psychological ROS: negative for - behavioral disorder, hallucinations, memory difficulties, mood swings or suicidal ideation Ophthalmic ROS: negative for - blurry vision, double vision, eye pain or loss of vision ENT ROS: negative for - epistaxis, nasal discharge, oral lesions, sore throat, tinnitus or vertigo Allergy and Immunology ROS: negative for - hives or itchy/watery eyes Hematological and Lymphatic ROS: negative for - bleeding problems, bruising or swollen lymph nodes Endocrine ROS: negative for - galactorrhea, hair pattern changes, polydipsia/polyuria or temperature intolerance Respiratory ROS: negative for - cough, hemoptysis, shortness of breath or wheezing Cardiovascular ROS: negative for - chest pain, dyspnea on exertion, edema or irregular heartbeat Gastrointestinal ROS: negative for - abdominal pain, diarrhea, hematemesis, nausea/vomiting or stool  incontinence Genito-Urinary ROS: negative for - dysuria, hematuria, incontinence or urinary frequency/urgency Musculoskeletal ROS: negative for - joint swelling or muscular weakness Neurological ROS: as noted in HPI Dermatological ROS: negative for rash and skin lesion changes   Blood pressure 136/83, pulse 68, temperature 98.7 F (37.1 C), temperature source Oral, resp. rate 20, height 5\' 9"  (1.753 m), weight 78.8 kg (173 lb 11.6 oz), SpO2 97 %.   Neurologic Examination:                                                                                                      HEENT-  Normocephalic, no lesions, without obvious abnormality.  Normal external eye and conjunctiva.  Normal TM's bilaterally.  Normal auditory canals and external ears. Normal external nose, mucus membranes and  septum.  Normal pharynx. Cardiovascular- S1, S2 normal, pulses palpable throughout   Lungs- chest clear, no wheezing, rales, normal symmetric air entry Abdomen- normal findings: bowel sounds normal Extremities- no clubbing Lymph-no adenopathy palpable Musculoskeletal-no joint tenderness, deformity or swelling Skin-warm and dry, no hyperpigmentation, vitiligo, or suspicious lesions  Neurological Examination Mental Status: Alert, oriented, thought content appropriate.  Speech fluent without evidence of aphasia.  Able to follow 3 step commands without difficulty. Cranial Nerves: II: Discs flat bilaterally; Visual fields grossly normal, pupils equal, round, reactive to light and accommodation III,IV, VI: ptosis not present, extra-ocular motions intact bilaterally V,VII: smile symmetric, facial light touch sensation normal bilaterally VIII: hearing normal bilaterally IX,X: uvula rises symmetrically XI: bilateral shoulder shrug XII: midline tongue extension Motor: Right : Upper extremity   5/5    Left:     Upper extremity   5/5  Lower extremity   5/5     Lower extremity   5/5 Tone and bulk:normal tone  throughout; no atrophy noted Sensory: Pinprick and light touch intact throughout, bilaterally Deep Tendon Reflexes: 2+ and symmetric throughout Plantars: Right: downgoing   Left: downgoing Cerebellar: normal finger-to-nose, normal rapid alternating movements and normal heel-to-shin test Gait: normal gait and station      Lab Results: Basic Metabolic Panel:  Recent Labs Lab 02/27/15 1030  02/27/15 1034 02/27/15 1041 02/27/15 2011 02/28/15 0245  NA  --   --  140 139 137 138  K  --   --  3.8 3.9 3.7 3.7  CL  --   --  103 105 108 105  CO2  --   --  24 22 18* 24  GLUCOSE  --   --  161* 175* 201* 106*  BUN  --   --  14 14 9 6   CREATININE  --   --  0.74 0.83 0.64 0.74  CALCIUM  --   < > 9.0 9.0 7.9* 8.4*  MG 1.9  --  1.9  --   --  2.0  PHOS  --   --  1.6*  --   --  3.9  < > = values in this interval not displayed.  Liver Function Tests:  Recent Labs Lab 02/27/15 1034 02/27/15 1041  AST 27 26  ALT 28 29  ALKPHOS 64 62  BILITOT 0.5 0.6  PROT 6.5 6.7  ALBUMIN 4.3 4.3    Recent Labs Lab 02/27/15 1034 02/27/15 1041 02/27/15 1418  LIPASE 22 21*  --   AMYLASE  --   --  30   No results for input(s): AMMONIA in the last 168 hours.  CBC:  Recent Labs Lab 02/27/15 1034 02/27/15 1041 02/28/15 0245  WBC 17.1* 17.3* 15.3*  HGB 15.5 15.6 13.3  HCT 45.2 45.3 39.0  MCV 85.1 85.3 84.6  PLT 187 180 186    Cardiac Enzymes:  Recent Labs Lab 02/27/15 1406 02/27/15 1418 02/27/15 1845 02/28/15 0157  CKTOTAL 63  --   --   --   CKMB 3.2  --   --   --   TROPONINI  --  0.03 0.11* 0.10*    Lipid Panel: No results for input(s): CHOL, TRIG, HDL, CHOLHDL, VLDL, LDLCALC in the last 168 hours.  CBG:  Recent Labs Lab 02/27/15 1749  GLUCAP 117*    Microbiology: Results for orders placed or performed during the hospital encounter of 02/27/15  Culture, blood (routine x 2)     Status: None (Preliminary result)   Collection Time: 02/27/15  10:10 AM  Result Value  Ref Range Status   Specimen Description BLOOD RIGHT HAND  Final   Special Requests BOTTLES DRAWN AEROBIC ONLY 2CC  Final   Culture PENDING  Incomplete   Report Status PENDING  Incomplete  Culture, blood (routine x 2)     Status: None (Preliminary result)   Collection Time: 02/27/15 10:15 AM  Result Value Ref Range Status   Specimen Description BLOOD LEFT HAND  Final   Special Requests BOTTLES DRAWN AEROBIC ONLY 2CC  Final   Culture PENDING  Incomplete   Report Status PENDING  Incomplete  CSF culture with Stat gram stain     Status: None (Preliminary result)   Collection Time: 02/27/15  6:56 PM  Result Value Ref Range Status   Specimen Description CSF  Final   Special Requests Normal  Final   Gram Stain CYTOSPIN SMEAR NO WBC SEEN NO ORGANISMS SEEN   Final   Culture PENDING  Incomplete   Report Status PENDING  Incomplete  MRSA PCR Screening     Status: None   Collection Time: 02/27/15  7:33 PM  Result Value Ref Range Status   MRSA by PCR NEGATIVE NEGATIVE Final    Comment:        The GeneXpert MRSA Assay (FDA approved for NASAL specimens only), is one component of a comprehensive MRSA colonization surveillance program. It is not intended to diagnose MRSA infection nor to guide or monitor treatment for MRSA infections.     Coagulation Studies:  Recent Labs  02/27/15 1034  LABPROT 13.5  INR 1.01    Imaging: Ct Head Wo Contrast  02/27/2015   CLINICAL DATA:  Headache. Vomiting. Nausea, vomiting, and diarrhea. Atrial fibrillation. History of migraines.  EXAM: CT HEAD WITHOUT CONTRAST  TECHNIQUE: Contiguous axial images were obtained from the base of the skull through the vertex without intravenous contrast.  COMPARISON:  None.  FINDINGS: There is no evidence of acute cortical infarct, intracranial hemorrhage, mass, midline shift, or extra-axial fluid collection. Ventricles and sulci are normal. Gray-white differentiation is preserved.  Orbits are unremarkable. Mild mucosal  thickening is partially visualized in the right frontal sinus and both maxillary sinuses. Mastoid air cells are clear. No skull fracture.  IMPRESSION: Unremarkable CT appearance of the brain.   Electronically Signed   By: Sebastian Ache M.D.   On: 02/27/2015 12:25   Ct Abdomen Pelvis W Contrast  02/27/2015   CLINICAL DATA:  Abdominal pain with nausea and vomiting. Elevated white blood count. Headache.  EXAM: CT ABDOMEN AND PELVIS WITH CONTRAST  TECHNIQUE: Multidetector CT imaging of the abdomen and pelvis was performed using the standard protocol following bolus administration of intravenous contrast.  CONTRAST:  100 cc Omnipaque 300  COMPARISON:  None.  FINDINGS: Lower chest:  Normal.  Hepatobiliary: Normal.  Pancreas: Normal.  Spleen: Normal.  Adrenals/Urinary Tract: Congenital horseshoe kidney. No obstruction. 10 mm cyst on the upper pole of the left side of the horseshoe kidney. Adrenal glands are normal. Bladder is normal.  Stomach/Bowel: Normal including the terminal ileum and appendix.  Vascular/Lymphatic: Normal.  Reproductive: Normal.  Other: No free air or free fluid. There are several small bubbles of air in the spinal canal and in the posterior paraspinal soft tissues, probably from recent lumbar puncture.  Musculoskeletal: Normal.  IMPRESSION: Benign appearing abdomen and pelvis.  Congenital horseshoe kidney.   Electronically Signed   By: Francene Boyers M.D.   On: 02/27/2015 16:59   Dg Chest [facility]  02/27/2015   CLINICAL DATA:  Shortness of breath and atrial fibrillation  EXAM: PORTABLE CHEST - 1 VIEW  COMPARISON:  April 13, 2008  FINDINGS: Lungs are clear. Heart size and pulmonary vascularity are normal. No adenopathy. No bone lesions.  IMPRESSION: No edema or consolidation.   Electronically Signed   By: Bretta Bang III M.D.   On: 02/27/2015 12:02   Dg Lumbar Puncture Fluoro Guide  02/27/2015   CLINICAL DATA:  Headache and vomiting. Sepsis. Evaluate for meningitis  EXAM: DIAGNOSTIC  LUMBAR PUNCTURE UNDER FLUOROSCOPIC GUIDANCE  FLUOROSCOPY TIME:  Radiation Exposure Index (as provided by the fluoroscopic device): 1.4 mGy entrance dose  If the device does not provide the exposure index:  Fluoroscopy Time (in minutes and seconds):  0.6 minutes  Number of Acquired Images:  0  PROCEDURE: Informed consent was obtained from the patient and his mother prior to the procedure by the ER staff (from prior attempt), including potential complications of headache, allergy, and pain. With the patient prone, the lower back was prepped with Betadine. 1% Lidocaine was used for local anesthesia. Lumbar puncture was performed at the L3-4 level using a 20 gauge needle with return of clear CSF. 11 ml of CSF were obtained for laboratory studies. The patient tolerated the procedure well and there were no apparent complications.  IMPRESSION: Successful lumbar puncture.   Electronically Signed   By: Marnee Spring M.D.   On: 02/27/2015 17:50       Assessment and plan per attending neurologist  Felicie Morn PA-C [facility] [phone]  02/28/2015, 12:07 PM   Assessment/Plan: 34 YO male with 3 days of HA with associated photophobia and phonophobia.  Thus far no relief with Magnesium and steroids.  CT head and LP non revealing. Likely Migraine HA but cannot exclude aneurysmal  leak.    Recommend: 1) MRI/A/V of head 2) Depakote 1 Gram once IV 3) Benadryl/Toradol/ Compazine Q 8 hours for 3 doses.  4) Will continue to follow  Elspeth Cho, DO [facility] [phone]  If 7pm- 7am, please page neurology on call as listed in AMION.

## 2015-02-28 NOTE — Progress Notes (Signed)
  Echocardiogram 2D Echocardiogram has been performed.  Shawn Schaefer 02/28/2015, 12:03 PM

## 2015-02-28 NOTE — H&P (Signed)
PULMONARY / CRITICAL CARE MEDICINE   Name: Shawn Schaefer MRN: [id number] DOB: 04/13/1981    ADMISSION DATE:  02/27/2015 CONSULTATION DATE:  02/27/2015   REFERRING MD :  Dr Jeraldine Loots of ER  CHIEF COMPLAINT:  SEpsis Syndrome  Subjective: awake, alert, no fevers, neg 1 liter on own  VITAL SIGNS: Temp:  [96.2 F (35.7 C)-98.9 F (37.2 C)] 98 F (36.7 C) (09/08 0728) Pulse Rate:  [63-122] 68 (09/08 0900) Resp:  [11-30] 20 (09/08 0900) BP: (106-152)/(65-94) 136/83 mmHg (09/08 0900) SpO2:  [88 %-100 %] 97 % (09/08 0900) Weight:  [77.8 kg (171 lb 8.3 oz)-78.8 kg (173 lb 11.6 oz)] 78.8 kg (173 lb 11.6 oz) (09/08 0500) HEMODYNAMICS:   VENTILATOR SETTINGS:   INTAKE / OUTPUT:  Intake/Output Summary (Last 24 hours) at 02/28/15 1125 Last data filed at 02/28/15 0900  Gross per 24 hour  Intake 2064.58 ml  Output   3330 ml  Net -1265.42 ml    PHYSICAL EXAMINATION:  General: awake, headache Neuro: alert, fc, nmonfocal HEENT: jvd wnl, neck not stiff PULM: CTA CV:  s1 s2 RRR no r/m GI: soft, BS wnl, no r Extremities: no edema, no rash   LABS:  PULMONARY No results for input(s): PHART, PCO2ART, PO2ART, HCO3, TCO2, O2SAT in the last 168 hours.  Invalid input(s): PCO2, PO2  CBC  Recent Labs Lab 02/27/15 1034 02/27/15 1041 02/28/15 0245  HGB 15.5 15.6 13.3  HCT 45.2 45.3 39.0  WBC 17.1* 17.3* 15.3*  PLT 187 180 186    COAGULATION  Recent Labs Lab 02/27/15 1034  INR 1.01    CARDIAC    Recent Labs Lab 02/27/15 1418 02/27/15 1845 02/28/15 0157  TROPONINI 0.03 0.11* 0.10*   No results for input(s): PROBNP in the last 168 hours.   CHEMISTRY  Recent Labs Lab 02/27/15 1030  02/27/15 1034 02/27/15 1041 02/27/15 2011 02/28/15 0245  NA  --   --  140 139 137 138  K  --   < > 3.8 3.9 3.7 3.7  CL  --   --  103 105 108 105  CO2  --   --  24 22 18* 24  GLUCOSE  --   --  161* 175* 201* 106*  BUN  --   --  14 14 9 6   CREATININE  --   --  0.74 0.83 0.64 0.74   CALCIUM  --   --  9.0 9.0 7.9* 8.4*  MG 1.9  --  1.9  --   --  2.0  PHOS  --   --  1.6*  --   --  3.9  < > = values in this interval not displayed. Estimated Creatinine Clearance: 132.6 mL/min (by C-G formula based on Cr of 0.74).   LIVER  Recent Labs Lab 02/27/15 1034 02/27/15 1041  AST 27 26  ALT 28 29  ALKPHOS 64 62  BILITOT 0.5 0.6  PROT 6.5 6.7  ALBUMIN 4.3 4.3  INR 1.01  --      INFECTIOUS  Recent Labs Lab 02/27/15 1034 02/27/15 1041 02/27/15 1406 02/27/15 1411 02/28/15 0245  LATICACIDVEN  --  4.18* 1.7 1.44  --   PROCALCITON <0.10  --   --   --  <0.10     ENDOCRINE CBG (last 3)   Recent Labs  02/27/15 1749  GLUCAP 117*         IMAGING x48h  - image(s) personally visualized  -   highlighted in bold Ct Head  Wo Contrast  02/27/2015   CLINICAL DATA:  Headache. Vomiting. Nausea, vomiting, and diarrhea. Atrial fibrillation. History of migraines.  EXAM: CT HEAD WITHOUT CONTRAST  TECHNIQUE: Contiguous axial images were obtained from the base of the skull through the vertex without intravenous contrast.  COMPARISON:  None.  FINDINGS: There is no evidence of acute cortical infarct, intracranial hemorrhage, mass, midline shift, or extra-axial fluid collection. Ventricles and sulci are normal. Gray-white differentiation is preserved.  Orbits are unremarkable. Mild mucosal thickening is partially visualized in the right frontal sinus and both maxillary sinuses. Mastoid air cells are clear. No skull fracture.  IMPRESSION: Unremarkable CT appearance of the brain.   Electronically Signed   By: Sebastian Ache M.D.   On: 02/27/2015 12:25   Ct Abdomen Pelvis W Contrast  02/27/2015   CLINICAL DATA:  Abdominal pain with nausea and vomiting. Elevated white blood count. Headache.  EXAM: CT ABDOMEN AND PELVIS WITH CONTRAST  TECHNIQUE: Multidetector CT imaging of the abdomen and pelvis was performed using the standard protocol following bolus administration of intravenous  contrast.  CONTRAST:  100 cc Omnipaque 300  COMPARISON:  None.  FINDINGS: Lower chest:  Normal.  Hepatobiliary: Normal.  Pancreas: Normal.  Spleen: Normal.  Adrenals/Urinary Tract: Congenital horseshoe kidney. No obstruction. 10 mm cyst on the upper pole of the left side of the horseshoe kidney. Adrenal glands are normal. Bladder is normal.  Stomach/Bowel: Normal including the terminal ileum and appendix.  Vascular/Lymphatic: Normal.  Reproductive: Normal.  Other: No free air or free fluid. There are several small bubbles of air in the spinal canal and in the posterior paraspinal soft tissues, probably from recent lumbar puncture.  Musculoskeletal: Normal.  IMPRESSION: Benign appearing abdomen and pelvis.  Congenital horseshoe kidney.   Electronically Signed   By: Francene Boyers M.D.   On: 02/27/2015 16:59   Dg Chest Port 1 View  02/27/2015   CLINICAL DATA:  Shortness of breath and atrial fibrillation  EXAM: PORTABLE CHEST - 1 VIEW  COMPARISON:  April 13, 2008  FINDINGS: Lungs are clear. Heart size and pulmonary vascularity are normal. No adenopathy. No bone lesions.  IMPRESSION: No edema or consolidation.   Electronically Signed   By: Bretta Bang III M.D.   On: 02/27/2015 12:02   Dg Lumbar Puncture Fluoro Guide  02/27/2015   CLINICAL DATA:  Headache and vomiting. Sepsis. Evaluate for meningitis  EXAM: DIAGNOSTIC LUMBAR PUNCTURE UNDER FLUOROSCOPIC GUIDANCE  FLUOROSCOPY TIME:  Radiation Exposure Index (as provided by the fluoroscopic device): 1.4 mGy entrance dose  If the device does not provide the exposure index:  Fluoroscopy Time (in minutes and seconds):  0.6 minutes  Number of Acquired Images:  0  PROCEDURE: Informed consent was obtained from the patient and his mother prior to the procedure by the ER staff (from prior attempt), including potential complications of headache, allergy, and pain. With the patient prone, the lower back was prepped with Betadine. 1% Lidocaine was used for local  anesthesia. Lumbar puncture was performed at the L3-4 level using a 20 gauge needle with return of clear CSF. 11 ml of CSF were obtained for laboratory studies. The patient tolerated the procedure well and there were no apparent complications.  IMPRESSION: Successful lumbar puncture.   Electronically Signed   By: Marnee Spring M.D.   On: 02/27/2015 17:50    ASSESSMENT / PLAN:  PULMONARY No O2 needed R/o viral illness P:   Can leave ICu will check sats when ambulating  CARDIOVASCULAR CVL -  agree to do it with emergency room physician A:  SIRS, sepsis syndrome, lactic acidosis. No frank shock at admission A. fib RVR resolved P:  Echo awaited tsh wnl Remains in SR Tele would keep LA cleared  RENAL ALactic acidosis at admission 4.0 \ Very mild hypomagnesemia less than 2 g percent  Iatrogenic output likely P:   Reduce fluids now kvo Send Urine na  GASTRO History of diarrhea at admission -viral likely P:   CT abd and pelvis 02/27/2015 GI prophylaxis- dc  Add diet  HEMAT Leukocytosis due to SIRS P:  ambulate  INFECTIOUS Blood culture 02/27/2015  urine culture 02/27/2015 Urine Streptococcus and Urine Legionella 02/27/2015 Stool multiplex PCR 02/27/2015 MRSA PCR 02/27/2015 HIV 02/27/2015 Urine drug screen 02/27/2015  Lumbar puncture done by the emergency department 02/27/2015 - negative overall    A:  SIRS, likley viral illness clearing   P:   ABX per ID likely can dc all abx in am  Pct low  ENDOCRINE A:  Nil acute P:   monitor  NEUROLOGIC A:   At risk SAH not seen on CT, xantho? P:   Neuro resolved Neuro for headaches, etiology i dont see mention xanthochromia on LP If headache continue, would MRI brain / MRA  Dermatology  a: Chigger bites in the ankles  P: Topical hydrocortisone cream twice a day  Psychiatry: A: History of anxiety depression and previous suicide attempts and polysubstance abuse P - Urine drug screen   FAMILY  - Updates:  mom and  patient updated at the bedside on admission  02/27/15. Mom wanted that he is at high risk for decompensation   - Inter-disciplinary family meet or Palliative Care meeting due by:  Due by 03/06/15   To [facility]  Mcarthur Rossetti. Tyson Alias, MD, FACP Pgr: [phone]  Pulmonary & Critical Care

## 2015-03-01 ENCOUNTER — Inpatient Hospital Stay (HOSPITAL_COMMUNITY): Payer: Medicaid Other

## 2015-03-01 DIAGNOSIS — R51 Headache: Secondary | ICD-10-CM

## 2015-03-01 DIAGNOSIS — R519 Headache, unspecified: Secondary | ICD-10-CM | POA: Insufficient documentation

## 2015-03-01 DIAGNOSIS — A419 Sepsis, unspecified organism: Principal | ICD-10-CM

## 2015-03-01 LAB — HEPATITIS PANEL, ACUTE
HCV Ab: <0.1 s/co ratio (ref 0.0–0.9)
HEP A IGM: NEGATIVE
HEP B C IGM: NEGATIVE
Hepatitis B Surface Ag: NEGATIVE

## 2015-03-01 LAB — PROCALCITONIN: Procalcitonin: <0.1 ng/mL

## 2015-03-01 MED ORDER — TOPIRAMATE 25 MG PO TABS
25.0000 mg | ORAL_TABLET | Freq: Every day | ORAL | Status: DC
  Administered 2015-03-01: 25 mg via ORAL
  Filled 2015-03-01 (×3): qty 1

## 2015-03-01 MED ORDER — VALPROATE SODIUM 500 MG/5ML IV SOLN
500.0000 mg | Freq: Once | INTRAVENOUS | Status: AC
  Administered 2015-03-01: 500 mg via INTRAVENOUS
  Filled 2015-03-01 (×2): qty 5

## 2015-03-01 NOTE — Progress Notes (Signed)
[facility] PROGRESS NOTE  SPYRIDON HORNSTEIN ONG:[id number] DOB: 04/14/1981 DOA: 02/27/2015 PCP: No primary care provider on file.  HPI/Subjective: Shawn Schaefer is a 34 y.o white males who presents to the unit from ED following an acute episode of nausea/vomiting, HA (+photophobia), and heart palpitations. He was found to be in afib with RVR (rates in 140s), hypothermic (96.2 rectally), lactic acid of 4.18, leukocytosis (17.3). Sepsis protocol was initiated and he was started on NS + vancomycin/zosyn emperically. ID/Neuro consulted. He was transferred to the unit hemodynamically stable for further investigation of HA and infectious process. Medical history includes migraine headaches, substance abuse with suicidal attempts, anxiety, and depression.   Today the patient appears to be doing much better. He reports much improvement of his headache. Denies any photophobia/phonophobia or neck pain/stiffness. He is able to touch chin to chest without complaint. Denies other vision changes or dizziness. Denies N/V/D. No complaints of focal neurological deficits. He reports that he has had these headaches off an on for the last 6 months and that his HA yesterday was similar to other HAs in the past.    Assessment/Plan: 1. Sepsis:etiology unknown, CSF/Blood cultures negative so far. RMSF/Hepatitis Serology/HIV neg.Suspision for a viral syndrome. Remains on empiric Vanco/Rocephin till cultures are final. Afebrile, improved- sepsis pathophysiology resolved-lactic acid levels back to normal. Headaches are consistent with Migraine.  2. Headaches: Patient has a history of migraine headaches x6 months. CT of head unremarkable. Per neurology, still unable to r/o vascular process (DST, aneurysm) and MRI ordered for clarification (awaiting results). Per neurology recommendations, plan is to  the patient on Depacon 500 mg IVx1 (with monitoring of VPA level) and continue Toradol 30 mg q8H. Consider DHE if MRI negative.   3. A.Fib with RVR: Back in sinus rhythm.No need for anticoagulation as Chads2vasc score is zero.Echo shows preserved EF.  4. 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 Patient complains of no symptoms today. Plan it to continue to monitor for N/V and give PRN Zofran if symptoms return.  5. Chigger bites: Continue hydrocortisone cream .5% BID 6. Substance abuse: Patient has a history of substance abuse with suicidal attempts. Denies any recent suicidal attempts. Lab work in the ED shows normal acetaminophen, salicylate, and alcohol level. Drugs of abuse screen pending. Plan is to continue monitoring.  7. DVT prophylaxis: Continue Heparin 5,000 units q8h.  Code Status: Full Family Communication: None at bedside Disposition Plan: Home pending MRI results    Consultants:  Neurology   Infectious Disease   Pharmacy   Procedures:  TTE  Antibiotics:  Vancomycin 1g q8h  Ceftriaxone 2g q12h   Objective: Filed Vitals:   03/01/15 0834  BP:   Pulse:   Temp: 99.1 F (37.3 C)  Resp:     Intake/Output Summary (Last 24 hours) at 03/01/15 0948 Last data filed at 03/01/15 0900  Gross per 24 hour  Intake 1632.75 ml  Output    400 ml  Net 1232.75 ml   Filed Weights   02/27/15 1750 02/28/15 0500 03/01/15 0500  Weight: 77.8 kg (171 lb 8.3 oz) 78.8 kg (173 lb 11.6 oz) 80.9 kg (178 lb 5.6 oz)    Exam:   General:  Well-dressed, well nourished male in no acute distress.  Cardiovascular: RRR, no M/R/G  Respiratory: CTA b/l. No wheeze or crackles.   Abdomen: Soft, non-tender, non-distended  Neuro: A&Ox3. Grip strength intact b/l at 5/5. UE/LE muscle strength equal bilaterally at 5/5. No focal  neurological deficits noted.    Data Reviewed: Basic Metabolic Panel:  Recent Labs Lab 02/27/15 1030 02/27/15 1034 02/27/15 1041 02/27/15 2011 02/28/15 0245 02/28/15 1647  NA  --  140 139 137 138 140  K  --   3.8 3.9 3.7 3.7 3.9  CL  --  103 105 108 105 107  CO2  --  24 22 18* 24 25  GLUCOSE  --  161* 175* 201* 106* 153*  BUN  --  CREATININE  --  0.74 0.83 0.64 0.74 0.95  CALCIUM  --  9.0 9.0 7.9* 8.4* 9.2  MG 1.9 1.9  --   --  2.0  --   PHOS  --  1.6*  --   --  3.9  --    Liver Function Tests:  Recent Labs Lab 02/27/15 1034 02/27/15 1041  AST 27 26  ALT 28 29  ALKPHOS 64 62  BILITOT 0.5 0.6  PROT 6.5 6.7  ALBUMIN 4.3 4.3    Recent Labs Lab 02/27/15 1034 02/27/15 1041 02/27/15 1418  LIPASE 22 21*  --   AMYLASE  --   --  30   No results for input(s): AMMONIA in the last 168 hours. CBC:  Recent Labs Lab 02/27/15 1034 02/27/15 1041 02/28/15 0245  WBC 17.1* 17.3* 15.3*  HGB 15.5 15.6 13.3  HCT 45.2 45.3 39.0  MCV 85.1 85.3 84.6  PLT 187 180 186   Cardiac Enzymes:  Recent Labs Lab 02/27/15 1406 02/27/15 1418 02/27/15 1845 02/28/15 0157  CKTOTAL 63  --   --   --   CKMB 3.2  --   --   --   TROPONINI  --  0.03 0.11* 0.10*   BNP (last 3 results) No results for input(s): BNP in the last 8760 hours.  ProBNP (last 3 results) No results for input(s): PROBNP in the last 8760 hours.  CBG:  Recent Labs Lab 02/27/15 1749  GLUCAP 117*    Recent Results (from the past 240 hour(s))  Culture, blood (routine x 2)     Status: None (Preliminary result)   Collection Time: 02/27/15 10:10 AM  Result Value Ref Range Status   Specimen Description BLOOD RIGHT HAND  Final   Special Requests BOTTLES DRAWN AEROBIC ONLY 2CC  Final   Culture NO GROWTH 1 DAY  Final   Report Status PENDING  Incomplete  Culture, blood (routine x 2)     Status: None (Preliminary result)   Collection Time: 02/27/15 10:15 AM  Result Value Ref Range Status   Specimen Description BLOOD LEFT HAND  Final   Special Requests BOTTLES DRAWN AEROBIC ONLY 2CC  Final   Culture NO GROWTH 1 DAY  Final   Report Status PENDING  Incomplete  CSF culture with Stat gram stain     Status: None  (Preliminary result)   Collection Time: 02/27/15  6:56 PM  Result Value Ref Range Status   Specimen Description CSF  Final   Special Requests Normal  Final   Gram Stain CYTOSPIN SMEAR NO WBC SEEN NO ORGANISMS SEEN   Final   Culture PENDING  Incomplete   Report Status PENDING  Incomplete  MRSA PCR Screening     Status: None   Collection Time: 02/27/15  7:33 PM  Result Value Ref Range Status   MRSA by PCR NEGATIVE NEGATIVE Final    Comment:        The GeneXpert MRSA Assay (FDA approved for NASAL  specimens only), is one component of a comprehensive MRSA colonization surveillance program. It is not intended to diagnose MRSA infection nor to guide or monitor treatment for MRSA infections.      Studies: Ct Head Wo Contrast  02/27/2015   CLINICAL DATA:  Headache. Vomiting. Nausea, vomiting, and diarrhea. Atrial fibrillation. History of migraines.  EXAM: CT HEAD WITHOUT CONTRAST  TECHNIQUE: Contiguous axial images were obtained from the base of the skull through the vertex without intravenous contrast.  COMPARISON:  None.  FINDINGS: There is no evidence of acute cortical infarct, intracranial hemorrhage, mass, midline shift, or extra-axial fluid collection. Ventricles and sulci are normal. Gray-white differentiation is preserved.  Orbits are unremarkable. Mild mucosal thickening is partially visualized in the right frontal sinus and both maxillary sinuses. Mastoid air cells are clear. No skull fracture.  IMPRESSION: Unremarkable CT appearance of the brain.   Electronically Signed   By: Sebastian Ache M.D.   On: 02/27/2015 12:25   Ct Abdomen Pelvis W Contrast  02/27/2015   CLINICAL DATA:  Abdominal pain with nausea and vomiting. Elevated white blood count. Headache.  EXAM: CT ABDOMEN AND PELVIS WITH CONTRAST  TECHNIQUE: Multidetector CT imaging of the abdomen and pelvis was performed using the standard protocol following bolus administration of intravenous contrast.  CONTRAST:  100 cc Omnipaque  300  COMPARISON:  None.  FINDINGS: Lower chest:  Normal.  Hepatobiliary: Normal.  Pancreas: Normal.  Spleen: Normal.  Adrenals/Urinary Tract: Congenital horseshoe kidney. No obstruction. 10 mm cyst on the upper pole of the left side of the horseshoe kidney. Adrenal glands are normal. Bladder is normal.  Stomach/Bowel: Normal including the terminal ileum and appendix.  Vascular/Lymphatic: Normal.  Reproductive: Normal.  Other: No free air or free fluid. There are several small bubbles of air in the spinal canal and in the posterior paraspinal soft tissues, probably from recent lumbar puncture.  Musculoskeletal: Normal.  IMPRESSION: Benign appearing abdomen and pelvis.  Congenital horseshoe kidney.   Electronically Signed   By: Francene Boyers M.D.   On: 02/27/2015 16:59   Dg Chest Port 1 View  02/27/2015   CLINICAL DATA:  Shortness of breath and atrial fibrillation  EXAM: PORTABLE CHEST - 1 VIEW  COMPARISON:  April 13, 2008  FINDINGS: Lungs are clear. Heart size and pulmonary vascularity are normal. No adenopathy. No bone lesions.  IMPRESSION: No edema or consolidation.   Electronically Signed   By: Bretta Bang III M.D.   On: 02/27/2015 12:02   Dg Lumbar Puncture Fluoro Guide  02/27/2015   CLINICAL DATA:  Headache and vomiting. Sepsis. Evaluate for meningitis  EXAM: DIAGNOSTIC LUMBAR PUNCTURE UNDER FLUOROSCOPIC GUIDANCE  FLUOROSCOPY TIME:  Radiation Exposure Index (as provided by the fluoroscopic device): 1.4 mGy entrance dose  If the device does not provide the exposure index:  Fluoroscopy Time (in minutes and seconds):  0.6 minutes  Number of Acquired Images:  0  PROCEDURE: Informed consent was obtained from the patient and his mother prior to the procedure by the ER staff (from prior attempt), including potential complications of headache, allergy, and pain. With the patient prone, the lower back was prepped with Betadine. 1% Lidocaine was used for local anesthesia. Lumbar puncture was performed at the  L3-4 level using a 20 gauge needle with return of clear CSF. 11 ml of CSF were obtained for laboratory studies. The patient tolerated the procedure well and there were no apparent complications.  IMPRESSION: Successful lumbar puncture.   Electronically Signed   By:  Marnee Spring M.D.   On: 02/27/2015 17:50    Scheduled Meds: . cefTRIAXone (ROCEPHIN)  IV  2 g Intravenous Q12H  . heparin  5,000 Units Subcutaneous 3 times per day  . hydrocortisone cream   Topical BID  . ketorolac  30 mg Intravenous 3 times per day  . valproate sodium  500 mg Intravenous Once  . vancomycin  1,000 mg Intravenous Q8H   Continuous Infusions: . sodium chloride    . sodium chloride 10 mL/hr at 03/01/15 0900    Active Problems:   Sepsis   Severe sepsis   Lactic acidosis   Headache   Hypothermia   Aneurysm   Fever   HA (headache)   Time spent: 30 minutes  Micayla Zeltman, Student-PA  [facility] If 7PM-7AM, please contact night-coverage at [email or web address], password [facility] 03/01/2015, 9:48 AM  LOS: 2 days    Attending MD note  Patient was seen, examined,treatment plan was discussed with the PA-S.  I have personally reviewed the clinical findings, lab, imaging studies and management of this patient in detail. I agree with the documentation, as recorded by the PA-S  Patient continues to have headaches, but by history clearly migraine headaches. CSF cultures, blood cultures are negative. Spoke with neurology, will start Topamax. Since MRV/MRA are negative-we can safely use triptans as abortive therapy. ID recommends continuing antibiotics for now. Stable to transfer to MedSurg unit   [facility]

## 2015-03-01 NOTE — Progress Notes (Signed)
INFECTIOUS DISEASE PROGRESS NOTE  ID: Shawn Schaefer is a 34 y.o. male with  Active Problems:   Sepsis   Severe sepsis   Lactic acidosis   Headache   Hypothermia   Aneurysm   Fever  Subjective: Continued headache  Abtx:  Anti-infectives    Start     Dose/Rate Route Frequency Ordered Stop   02/27/15 2000  vancomycin (VANCOCIN) IVPB 1000 mg/200 mL premix     1,000 mg 200 mL/hr over 60 Minutes Intravenous Every 8 hours 02/27/15 1146     02/27/15 2000  ampicillin (OMNIPEN) 2 g in sodium chloride 0.9 % 50 mL IVPB  Status:  Discontinued     2 g 150 mL/hr over 20 Minutes Intravenous 6 times per day 02/27/15 1802 02/28/15 0923   02/27/15 1800  piperacillin-tazobactam (ZOSYN) IVPB 3.375 g  Status:  Discontinued     3.375 g 12.5 mL/hr over 240 Minutes Intravenous Every 8 hours 02/27/15 1146 02/27/15 1413   02/27/15 1430  cefTRIAXone (ROCEPHIN) 2 g in dextrose 5 % 50 mL IVPB     2 g 100 mL/hr over 30 Minutes Intravenous Every 12 hours 02/27/15 1413     02/27/15 1400  cefTRIAXone (ROCEPHIN) 2 g in dextrose 5 % 50 mL IVPB  Status:  Discontinued     2 g 100 mL/hr over 30 Minutes Intravenous Every 24 hours 02/27/15 1345 02/27/15 1413   02/27/15 1145  piperacillin-tazobactam (ZOSYN) IVPB 3.375 g     3.375 g 100 mL/hr over 30 Minutes Intravenous  Once 02/27/15 1130 02/27/15 1217   02/27/15 1145  vancomycin (VANCOCIN) IVPB 1000 mg/200 mL premix     1,000 mg 200 mL/hr over 60 Minutes Intravenous  Once 02/27/15 1130 02/27/15 1400      Medications:  Scheduled: . cefTRIAXone (ROCEPHIN)  IV  2 g Intravenous Q12H  . heparin  5,000 Units Subcutaneous 3 times per day  . hydrocortisone cream   Topical BID  . ketorolac  30 mg Intravenous 3 times per day  . valproate sodium  500 mg Intravenous Once  . vancomycin  1,000 mg Intravenous Q8H    Objective: Vital signs in last 24 hours: Temp:  [98 F (36.7 C)-99.1 F (37.3 C)] 99.1 F (37.3 C) (09/09 0834) Pulse Rate:  [66-78] 77 (09/08  1600) Resp:  [17-24] 18 (09/08 2000) BP: (120-150)/(62-93) 126/70 mmHg (09/08 2000) SpO2:  [95 %-98 %] 98 % (09/08 2000) Weight:  [80.9 kg (178 lb 5.6 oz)] 80.9 kg (178 lb 5.6 oz) (09/09 0500)   General appearance: alert, cooperative and mild distress Eyes: mild photophobia Neck: neck rotation "too uncomforatble" Resp: clear to auscultation bilaterally Cardio: regular rate and rhythm GI: normal findings: bowel sounds normal and soft, non-tender  Lab Results  Recent Labs  02/27/15 1041  02/28/15 0245 02/28/15 1647  WBC 17.3*  --  15.3*  --   HGB 15.6  --  13.3  --   HCT 45.3  --  39.0  --   NA 139  < > 138 140  K 3.9  < > 3.7 3.9  CL 105  < > 105 107  CO2 22  < > 24 25  BUN 14  < > 6 10  CREATININE 0.83  < > 0.74 0.95  < > = values in this interval not displayed. Liver Panel  Recent Labs  02/27/15 1034 02/27/15 1041  PROT 6.5 6.7  ALBUMIN 4.3 4.3  AST 27 26  ALT 28 29  ALKPHOS 64 62  BILITOT 0.5 0.6  BILIDIR 0.1  --   IBILI 0.4  --    Sedimentation Rate No results for input(s): ESRSEDRATE in the last 72 hours. C-Reactive Protein No results for input(s): CRP in the last 72 hours.  Microbiology: Recent Results (from the past 240 hour(s))  Culture, blood (routine x 2)     Status: None (Preliminary result)   Collection Time: 02/27/15 10:10 AM  Result Value Ref Range Status   Specimen Description BLOOD RIGHT HAND  Final   Special Requests BOTTLES DRAWN AEROBIC ONLY 2CC  Final   Culture NO GROWTH 1 DAY  Final   Report Status PENDING  Incomplete  Culture, blood (routine x 2)     Status: None (Preliminary result)   Collection Time: 02/27/15 10:15 AM  Result Value Ref Range Status   Specimen Description BLOOD LEFT HAND  Final   Special Requests BOTTLES DRAWN AEROBIC ONLY 2CC  Final   Culture NO GROWTH 1 DAY  Final   Report Status PENDING  Incomplete  CSF culture with Stat gram stain     Status: None (Preliminary result)   Collection Time: 02/27/15  6:56 PM    Result Value Ref Range Status   Specimen Description CSF  Final   Special Requests Normal  Final   Gram Stain CYTOSPIN SMEAR NO WBC SEEN NO ORGANISMS SEEN   Final   Culture PENDING  Incomplete   Report Status PENDING  Incomplete  MRSA PCR Screening     Status: None   Collection Time: 02/27/15  7:33 PM  Result Value Ref Range Status   MRSA by PCR NEGATIVE NEGATIVE Final    Comment:        The GeneXpert MRSA Assay (FDA approved for NASAL specimens only), is one component of a comprehensive MRSA colonization surveillance program. It is not intended to diagnose MRSA infection nor to guide or monitor treatment for MRSA infections.     Studies/Results: Ct Head Wo Contrast  02/27/2015   CLINICAL DATA:  Headache. Vomiting. Nausea, vomiting, and diarrhea. Atrial fibrillation. History of migraines.  EXAM: CT HEAD WITHOUT CONTRAST  TECHNIQUE: Contiguous axial images were obtained from the base of the skull through the vertex without intravenous contrast.  COMPARISON:  None.  FINDINGS: There is no evidence of acute cortical infarct, intracranial hemorrhage, mass, midline shift, or extra-axial fluid collection. Ventricles and sulci are normal. Gray-white differentiation is preserved.  Orbits are unremarkable. Mild mucosal thickening is partially visualized in the right frontal sinus and both maxillary sinuses. Mastoid air cells are clear. No skull fracture.  IMPRESSION: Unremarkable CT appearance of the brain.   Electronically Signed   By: Sebastian Ache M.D.   On: 02/27/2015 12:25   Ct Abdomen Pelvis W Contrast  02/27/2015   CLINICAL DATA:  Abdominal pain with nausea and vomiting. Elevated white blood count. Headache.  EXAM: CT ABDOMEN AND PELVIS WITH CONTRAST  TECHNIQUE: Multidetector CT imaging of the abdomen and pelvis was performed using the standard protocol following bolus administration of intravenous contrast.  CONTRAST:  100 cc Omnipaque 300  COMPARISON:  None.  FINDINGS: Lower chest:   Normal.  Hepatobiliary: Normal.  Pancreas: Normal.  Spleen: Normal.  Adrenals/Urinary Tract: Congenital horseshoe kidney. No obstruction. 10 mm cyst on the upper pole of the left side of the horseshoe kidney. Adrenal glands are normal. Bladder is normal.  Stomach/Bowel: Normal including the terminal ileum and appendix.  Vascular/Lymphatic: Normal.  Reproductive: Normal.  Other: No free air  or free fluid. There are several small bubbles of air in the spinal canal and in the posterior paraspinal soft tissues, probably from recent lumbar puncture.  Musculoskeletal: Normal.  IMPRESSION: Benign appearing abdomen and pelvis.  Congenital horseshoe kidney.   Electronically Signed   By: Francene Boyers M.D.   On: 02/27/2015 16:59   Dg Chest Port 1 View  02/27/2015   CLINICAL DATA:  Shortness of breath and atrial fibrillation  EXAM: PORTABLE CHEST - 1 VIEW  COMPARISON:  April 13, 2008  FINDINGS: Lungs are clear. Heart size and pulmonary vascularity are normal. No adenopathy. No bone lesions.  IMPRESSION: No edema or consolidation.   Electronically Signed   By: Bretta Bang III M.D.   On: 02/27/2015 12:02   Dg Lumbar Puncture Fluoro Guide  02/27/2015   CLINICAL DATA:  Headache and vomiting. Sepsis. Evaluate for meningitis  EXAM: DIAGNOSTIC LUMBAR PUNCTURE UNDER FLUOROSCOPIC GUIDANCE  FLUOROSCOPY TIME:  Radiation Exposure Index (as provided by the fluoroscopic device): 1.4 mGy entrance dose  If the device does not provide the exposure index:  Fluoroscopy Time (in minutes and seconds):  0.6 minutes  Number of Acquired Images:  0  PROCEDURE: Informed consent was obtained from the patient and his mother prior to the procedure by the ER staff (from prior attempt), including potential complications of headache, allergy, and pain. With the patient prone, the lower back was prepped with Betadine. 1% Lidocaine was used for local anesthesia. Lumbar puncture was performed at the L3-4 level using a 20 gauge needle with return of  clear CSF. 11 ml of CSF were obtained for laboratory studies. The patient tolerated the procedure well and there were no apparent complications.  IMPRESSION: Successful lumbar puncture.   Electronically Signed   By: Marnee Spring M.D.   On: 02/27/2015 17:50     Assessment/Plan: Sepsis Headaches  Afebrile, could be from steroids.  Appreciate neuro eval.  Watch his Cx, when negative, final, will stop remaining anbx WBC slightly better Await Ehrlichia serologies HIV is (-), RMSF (-)  Total days of antibiotics: 2 vanco/ceftriaxone         Johny Sax Infectious Diseases (pager) [phone] [email or web address] 03/01/2015, 9:28 AM  LOS: 2 days

## 2015-03-01 NOTE — Progress Notes (Signed)
Subjective: No overnight events. Continued headache. Notes some improvement compared to yesterday. Awaiting MRI imaging.   Objective: Current vital signs: BP 126/70 mmHg  Pulse 77  Temp(Src) 98.9 F (37.2 C) (Oral)  Resp 18  Ht 5\' 9"  (1.753 m)  Wt 80.9 kg (178 lb 5.6 oz)  BMI 26.33 kg/m2  SpO2 98% Vital signs in last 24 hours: Temp:  [98 F (36.7 C)-98.9 F (37.2 C)] 98.9 F (37.2 C) (09/09 0500) Pulse Rate:  [64-78] 77 (09/08 1600) Resp:  [17-24] 18 (09/08 2000) BP: (120-150)/(62-93) 126/70 mmHg (09/08 2000) SpO2:  [95 %-98 %] 98 % (09/08 2000) Weight:  [80.9 kg (178 lb 5.6 oz)] 80.9 kg (178 lb 5.6 oz) (09/09 0500)  Intake/Output from previous day: 09/08 0701 - 09/09 0700 In: 1902.8 [P.O.:240; I.V.:912.8; IV Piggyback:750] Out: 1000 [Urine:1000] Intake/Output this shift:   Nutritional status: Diet regular Room service appropriate?: Yes; Fluid consistency:: Thin  Neurologic Exam: Mental Status: Alert, oriented, thought content appropriate. Speech fluent without evidence of aphasia. Cranial Nerves: II: Visual fields grossly normal, pupils equal, round, reactive to light  III,IV, VI: ptosis not present, extra-ocular motions intact bilaterally V,VII: smile symmetric, facial light touch sensation normal bilaterally Motor: Right :Upper extremity 5/5Left: Upper extremity 5/5 Lower extremity 5/5Lower extremity 5/5 Tone and bulk:normal tone throughout; no atrophy noted Sensory: light touch intact throughout, bilaterally  Lab Results: Basic Metabolic Panel:  Recent Labs Lab 02/27/15 1030  02/27/15 1034 02/27/15 1041 02/27/15 2011 02/28/15 0245 02/28/15 1647  NA  --   --  140 139 137 138 140  K  --   --  3.8 3.9 3.7 3.7 3.9  CL  --   --  103 105 108 105 107  CO2  --   --  24 22 18* 24 25  GLUCOSE  --   --  161* 175* 201* 106* 153*  BUN  --   --  14 14  9 6 10   CREATININE  --   --  0.74 0.83 0.64 0.74 0.95  CALCIUM  --   < > 9.0 9.0 7.9* 8.4* 9.2  MG 1.9  --  1.9  --   --  2.0  --   PHOS  --   --  1.6*  --   --  3.9  --   < > = values in this interval not displayed.  Liver Function Tests:  Recent Labs Lab 02/27/15 1034 02/27/15 1041  AST 27 26  ALT 28 29  ALKPHOS 64 62  BILITOT 0.5 0.6  PROT 6.5 6.7  ALBUMIN 4.3 4.3    Recent Labs Lab 02/27/15 1034 02/27/15 1041 02/27/15 1418  LIPASE 22 21*  --   AMYLASE  --   --  30   No results for input(s): AMMONIA in the last 168 hours.  CBC:  Recent Labs Lab 02/27/15 1034 02/27/15 1041 02/28/15 0245  WBC 17.1* 17.3* 15.3*  HGB 15.5 15.6 13.3  HCT 45.2 45.3 39.0  MCV 85.1 85.3 84.6  PLT 187 180 186    Cardiac Enzymes:  Recent Labs Lab 02/27/15 1406 02/27/15 1418 02/27/15 1845 02/28/15 0157  CKTOTAL 63  --   --   --   CKMB 3.2  --   --   --   TROPONINI  --  0.03 0.11* 0.10*    Lipid Panel: No results for input(s): CHOL, TRIG, HDL, CHOLHDL, VLDL, LDLCALC in the last 168 hours.  CBG:  Recent Labs Lab 02/27/15 1749  GLUCAP 117*  Microbiology: Results for orders placed or performed during the hospital encounter of 02/27/15  Culture, blood (routine x 2)     Status: None (Preliminary result)   Collection Time: 02/27/15 10:10 AM  Result Value Ref Range Status   Specimen Description BLOOD RIGHT HAND  Final   Special Requests BOTTLES DRAWN AEROBIC ONLY 2CC  Final   Culture NO GROWTH 1 DAY  Final   Report Status PENDING  Incomplete  Culture, blood (routine x 2)     Status: None (Preliminary result)   Collection Time: 02/27/15 10:15 AM  Result Value Ref Range Status   Specimen Description BLOOD LEFT HAND  Final   Special Requests BOTTLES DRAWN AEROBIC ONLY 2CC  Final   Culture NO GROWTH 1 DAY  Final   Report Status PENDING  Incomplete  CSF culture with Stat gram stain     Status: None (Preliminary result)   Collection Time: 02/27/15  6:56 PM  Result  Value Ref Range Status   Specimen Description CSF  Final   Special Requests Normal  Final   Gram Stain CYTOSPIN SMEAR NO WBC SEEN NO ORGANISMS SEEN   Final   Culture PENDING  Incomplete   Report Status PENDING  Incomplete  MRSA PCR Screening     Status: None   Collection Time: 02/27/15  7:33 PM  Result Value Ref Range Status   MRSA by PCR NEGATIVE NEGATIVE Final    Comment:        The GeneXpert MRSA Assay (FDA approved for NASAL specimens only), is one component of a comprehensive MRSA colonization surveillance program. It is not intended to diagnose MRSA infection nor to guide or monitor treatment for MRSA infections.     Coagulation Studies:  Recent Labs  02/27/15 1034  LABPROT 13.5  INR 1.01    Imaging: Ct Head Wo Contrast  02/27/2015   CLINICAL DATA:  Headache. Vomiting. Nausea, vomiting, and diarrhea. Atrial fibrillation. History of migraines.  EXAM: CT HEAD WITHOUT CONTRAST  TECHNIQUE: Contiguous axial images were obtained from the base of the skull through the vertex without intravenous contrast.  COMPARISON:  None.  FINDINGS: There is no evidence of acute cortical infarct, intracranial hemorrhage, mass, midline shift, or extra-axial fluid collection. Ventricles and sulci are normal. Gray-white differentiation is preserved.  Orbits are unremarkable. Mild mucosal thickening is partially visualized in the right frontal sinus and both maxillary sinuses. Mastoid air cells are clear. No skull fracture.  IMPRESSION: Unremarkable CT appearance of the brain.   Electronically Signed   By: Sebastian Ache M.D.   On: 02/27/2015 12:25   Ct Abdomen Pelvis W Contrast  02/27/2015   CLINICAL DATA:  Abdominal pain with nausea and vomiting. Elevated white blood count. Headache.  EXAM: CT ABDOMEN AND PELVIS WITH CONTRAST  TECHNIQUE: Multidetector CT imaging of the abdomen and pelvis was performed using the standard protocol following bolus administration of intravenous contrast.  CONTRAST:   100 cc Omnipaque 300  COMPARISON:  None.  FINDINGS: Lower chest:  Normal.  Hepatobiliary: Normal.  Pancreas: Normal.  Spleen: Normal.  Adrenals/Urinary Tract: Congenital horseshoe kidney. No obstruction. 10 mm cyst on the upper pole of the left side of the horseshoe kidney. Adrenal glands are normal. Bladder is normal.  Stomach/Bowel: Normal including the terminal ileum and appendix.  Vascular/Lymphatic: Normal.  Reproductive: Normal.  Other: No free air or free fluid. There are several small bubbles of air in the spinal canal and in the posterior paraspinal soft tissues, probably from recent lumbar  puncture.  Musculoskeletal: Normal.  IMPRESSION: Benign appearing abdomen and pelvis.  Congenital horseshoe kidney.   Electronically Signed   By: Francene Boyers M.D.   On: 02/27/2015 16:59   Dg Chest Port 1 View  02/27/2015   CLINICAL DATA:  Shortness of breath and atrial fibrillation  EXAM: PORTABLE CHEST - 1 VIEW  COMPARISON:  April 13, 2008  FINDINGS: Lungs are clear. Heart size and pulmonary vascularity are normal. No adenopathy. No bone lesions.  IMPRESSION: No edema or consolidation.   Electronically Signed   By: Bretta Bang III M.D.   On: 02/27/2015 12:02   Dg Lumbar Puncture Fluoro Guide  02/27/2015   CLINICAL DATA:  Headache and vomiting. Sepsis. Evaluate for meningitis  EXAM: DIAGNOSTIC LUMBAR PUNCTURE UNDER FLUOROSCOPIC GUIDANCE  FLUOROSCOPY TIME:  Radiation Exposure Index (as provided by the fluoroscopic device): 1.4 mGy entrance dose  If the device does not provide the exposure index:  Fluoroscopy Time (in minutes and seconds):  0.6 minutes  Number of Acquired Images:  0  PROCEDURE: Informed consent was obtained from the patient and his mother prior to the procedure by the ER staff (from prior attempt), including potential complications of headache, allergy, and pain. With the patient prone, the lower back was prepped with Betadine. 1% Lidocaine was used for local anesthesia. Lumbar puncture was  performed at the L3-4 level using a 20 gauge needle with return of clear CSF. 11 ml of CSF were obtained for laboratory studies. The patient tolerated the procedure well and there were no apparent complications.  IMPRESSION: Successful lumbar puncture.   Electronically Signed   By: Marnee Spring M.D.   On: 02/27/2015 17:50    Medications:  Scheduled: . cefTRIAXone (ROCEPHIN)  IV  2 g Intravenous Q12H  . heparin  5,000 Units Subcutaneous 3 times per day  . hydrocortisone cream   Topical BID  . ketorolac  30 mg Intravenous 3 times per day  . valproate sodium  500 mg Intravenous Once  . vancomycin  1,000 mg Intravenous Q8H    Assessment/Plan:  34 YO male with 3 days of HA with associated photophobia and phonophobia.CT head and LP non revealing. Likely Migraine HA but cannot exclude vascular process such as aneurysm or DST.   -will give additional depacon 500mg  IV x 1. Will check VPA level -awaiting MRI/A/V -will consider DHE but will await results of MRI  -will continue to follow   LOS: 2 days   Elspeth Cho, DO [facility] [phone]  If 7pm- 7am, please page neurology on call as listed in AMION. 03/01/2015  7:53 AM

## 2015-03-02 DIAGNOSIS — I4891 Unspecified atrial fibrillation: Secondary | ICD-10-CM

## 2015-03-02 LAB — CBC
HEMATOCRIT: 42.7 % (ref 39.0–52.0)
Hemoglobin: 14.5 g/dL (ref 13.0–17.0)
MCH: 28.8 pg (ref 26.0–34.0)
MCHC: 34 g/dL (ref 30.0–36.0)
MCV: 84.7 fL (ref 78.0–100.0)
Platelets: 169 10*3/uL (ref 150–400)
RBC: 5.04 MIL/uL (ref 4.22–5.81)
RDW: 12.8 % (ref 11.5–15.5)
WBC: 13.3 10*3/uL — ABNORMAL HIGH (ref 4.0–10.5)

## 2015-03-02 LAB — VALPROIC ACID LEVEL: VALPROIC ACID LVL: 15 ug/mL — AB (ref 50.0–100.0)

## 2015-03-02 MED ORDER — DICLOFENAC POTASSIUM(MIGRAINE) 50 MG PO PACK
PACK | ORAL | Status: DC
Start: 2015-03-02 — End: 2016-06-03

## 2015-03-02 MED ORDER — TOPIRAMATE 25 MG PO TABS: 25.0000 mg | ORAL_TABLET | Freq: Every day | ORAL | Status: DC

## 2015-03-02 NOTE — Progress Notes (Signed)
Subjective Headache improved compared to admission. MRI imaging reviewed and overall unremarkable.   Feels he is ready to go home today.   Objective: Current vital signs: BP 131/79 mmHg  Pulse 60  Temp(Src) 98.1 F (36.7 C) (Oral)  Resp 19  Ht 5\' 9"  (1.753 m)  Wt 81.149 kg (178 lb 14.4 oz)  BMI 26.41 kg/m2  SpO2 99% Vital signs in last 24 hours: Temp:  [97.8 F (36.6 C)-99.1 F (37.3 C)] 98.1 F (36.7 C) (09/10 0438) Pulse Rate:  [60-81] 60 (09/10 0438) Resp:  [18-19] 19 (09/10 0438) BP: (131-134)/(79-91) 131/79 mmHg (09/10 0438) SpO2:  [98 %-99 %] 99 % (09/10 0438) Weight:  [81.149 kg (178 lb 14.4 oz)] 81.149 kg (178 lb 14.4 oz) (09/09 2100)  Intake/Output from previous day: 09/09 0701 - 09/10 0700 In: 250 [I.V.:50; IV Piggyback:200] Out: 1200 [Urine:1200] Intake/Output this shift:   Nutritional status: Diet regular Room service appropriate?: Yes; Fluid consistency:: Thin  Neurologic Exam: Mental Status: Alert, oriented, thought content appropriate. Speech fluent without evidence of aphasia. Cranial Nerves: II: Visual fields grossly normal, pupils equal, round, reactive to light  III,IV, VI: ptosis not present, extra-ocular motions intact bilaterally V,VII: smile symmetric, facial light touch sensation normal bilaterally Motor: Right :Upper extremity 5/5Left: Upper extremity 5/5 Lower extremity 5/5Lower extremity 5/5 Tone and bulk:normal tone throughout; no atrophy noted Sensory: light touch intact throughout, bilaterally  Lab Results: Basic Metabolic Panel:  Recent Labs Lab 02/27/15 1030  02/27/15 1034 02/27/15 1041 02/27/15 2011 02/28/15 0245 02/28/15 1647  NA  --   --  140 139 137 138 140  K  --   --  3.8 3.9 3.7 3.7 3.9  CL  --   --  103 105 108 105 107  CO2  --   --  24 22 18* 24 25  GLUCOSE  --   --  161* 175* 201* 106* 153*  BUN  --    --  14 14 9 6 10   CREATININE  --   --  0.74 0.83 0.64 0.74 0.95  CALCIUM  --   < > 9.0 9.0 7.9* 8.4* 9.2  MG 1.9  --  1.9  --   --  2.0  --   PHOS  --   --  1.6*  --   --  3.9  --   < > = values in this interval not displayed.  Liver Function Tests:  Recent Labs Lab 02/27/15 1034 02/27/15 1041  AST 27 26  ALT 28 29  ALKPHOS 64 62  BILITOT 0.5 0.6  PROT 6.5 6.7  ALBUMIN 4.3 4.3    Recent Labs Lab 02/27/15 1034 02/27/15 1041 02/27/15 1418  LIPASE 22 21*  --   AMYLASE  --   --  30   No results for input(s): AMMONIA in the last 168 hours.  CBC:  Recent Labs Lab 02/27/15 1034 02/27/15 1041 02/28/15 0245 03/02/15 0407  WBC 17.1* 17.3* 15.3* 13.3*  HGB 15.5 15.6 13.3 14.5  HCT 45.2 45.3 39.0 42.7  MCV 85.1 85.3 84.6 84.7  PLT 187 180 186 169    Cardiac Enzymes:  Recent Labs Lab 02/27/15 1406 02/27/15 1418 02/27/15 1845 02/28/15 0157  CKTOTAL 63  --   --   --   CKMB 3.2  --   --   --   TROPONINI  --  0.03 0.11* 0.10*    Lipid Panel: No results for input(s): CHOL, TRIG, HDL, CHOLHDL, VLDL, LDLCALC in the last  168 hours.  CBG:  Recent Labs Lab 02/27/15 1749  GLUCAP 117*    Microbiology: Results for orders placed or performed during the hospital encounter of 02/27/15  Culture, blood (routine x 2)     Status: None (Preliminary result)   Collection Time: 02/27/15 10:10 AM  Result Value Ref Range Status   Specimen Description BLOOD RIGHT HAND  Final   Special Requests BOTTLES DRAWN AEROBIC ONLY 2CC  Final   Culture NO GROWTH 2 DAYS  Final   Report Status PENDING  Incomplete  Culture, blood (routine x 2)     Status: None (Preliminary result)   Collection Time: 02/27/15 10:15 AM  Result Value Ref Range Status   Specimen Description BLOOD LEFT HAND  Final   Special Requests BOTTLES DRAWN AEROBIC ONLY 2CC  Final   Culture NO GROWTH 2 DAYS  Final   Report Status PENDING  Incomplete  CSF culture with Stat gram stain     Status: None (Preliminary  result)   Collection Time: 02/27/15  6:56 PM  Result Value Ref Range Status   Specimen Description CSF  Final   Special Requests Normal  Final   Gram Stain CYTOSPIN SMEAR NO WBC SEEN NO ORGANISMS SEEN   Final   Culture NO GROWTH 2 DAYS  Final   Report Status PENDING  Incomplete  MRSA PCR Screening     Status: None   Collection Time: 02/27/15  7:33 PM  Result Value Ref Range Status   MRSA by PCR NEGATIVE NEGATIVE Final    Comment:        The GeneXpert MRSA Assay (FDA approved for NASAL specimens only), is one component of a comprehensive MRSA colonization surveillance program. It is not intended to diagnose MRSA infection nor to guide or monitor treatment for MRSA infections.     Coagulation Studies:  Recent Labs  02/27/15 1034  LABPROT 13.5  INR 1.01    Imaging: Mr Maxine Glenn Head Wo Contrast  03/01/2015   CLINICAL DATA:  THREE DAYS OF HEADACHE WITH PHOTOPHOBIA AND PHOTOPHOBIA. DIZZINESS.  EXAM: MRI HEAD WITHOUT CONTRAST  MRA HEAD WITHOUT CONTRAST  MRV HEAD WITHOUT CONTRAST  TECHNIQUE: Multiplanar, multiecho pulse sequences of the brain and surrounding structures were obtained without intravenous contrast. Angiographic images of the head were obtained using MRA/MRV technique without contrast.  COMPARISON:  HEAD CT FROM 2 DAYS AGO  FINDINGS: MRI HEAD FINDINGS  Calvarium and upper cervical spine: No focal marrow signal abnormality.  Orbits: No significant findings.  Sinuses and Mastoids: Mild scattered mucosal thickening in the paranasal sinuses. No acute sinusitis.  Brain: No acute or remote infarct, hemorrhage, hydrocephalus, or intra-axial mass lesion. No evidence of large vessel occlusion.  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 Fatty content confirmed on previous head CT.  MRA HEAD FINDINGS  Bilateral dominant AICA. There is a large left posterior communicating artery which duplicates the left PCA. The anterior circulation contribution  serves the more inferior distribution of the left PCA. Symmetric carotids. Intact anterior communicating artery.  No stenosis, aneurysm, or evidence of vascular malformation.  MRV HEAD FINDINGS  2D and 3D techniques were performed. There is no evidence of dural venous sinus thrombosis, stenosis, or occlusion. No indication of deep or cortical venous thrombosis.  IMPRESSION: 1. Negative brain MRI and intracranial MRA/MRV. 2. Incidental, small left perimedullary lipoma.   Electronically Signed   By: Marnee Spring M.D.   On: 03/01/2015 11:13  Mr Brain Wo Contrast  03/01/2015   CLINICAL DATA:  THREE DAYS OF HEADACHE WITH PHOTOPHOBIA AND PHOTOPHOBIA. DIZZINESS.  EXAM: MRI HEAD WITHOUT CONTRAST  MRA HEAD WITHOUT CONTRAST  MRV HEAD WITHOUT CONTRAST  TECHNIQUE: Multiplanar, multiecho pulse sequences of the brain and surrounding structures were obtained without intravenous contrast. Angiographic images of the head were obtained using MRA/MRV technique without contrast.  COMPARISON:  HEAD CT FROM 2 DAYS AGO  FINDINGS: MRI HEAD FINDINGS  Calvarium and upper cervical spine: No focal marrow signal abnormality.  Orbits: No significant findings.  Sinuses and Mastoids: Mild scattered mucosal thickening in the paranasal sinuses. No acute sinusitis.  Brain: No acute or remote infarct, hemorrhage, hydrocephalus, or intra-axial mass lesion. No evidence of large vessel occlusion.  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 Fatty content confirmed on previous head CT.  MRA HEAD FINDINGS  Bilateral dominant AICA. There is a large left posterior communicating artery which duplicates the left PCA. The anterior circulation contribution serves the more inferior distribution of the left PCA. Symmetric carotids. Intact anterior communicating artery.  No stenosis, aneurysm, or evidence of vascular malformation.  MRV HEAD FINDINGS  2D and 3D techniques were performed. There is no evidence of dural  venous sinus thrombosis, stenosis, or occlusion. No indication of deep or cortical venous thrombosis.  IMPRESSION: 1. Negative brain MRI and intracranial MRA/MRV. 2. Incidental, small left perimedullary lipoma.   Electronically Signed   By: Marnee Spring M.D.   On: 03/01/2015 11:13   Mr Susie Cassette Head  03/01/2015   CLINICAL DATA:  THREE DAYS OF HEADACHE WITH PHOTOPHOBIA AND PHOTOPHOBIA. DIZZINESS.  EXAM: MRI HEAD WITHOUT CONTRAST  MRA HEAD WITHOUT CONTRAST  MRV HEAD WITHOUT CONTRAST  TECHNIQUE: Multiplanar, multiecho pulse sequences of the brain and surrounding structures were obtained without intravenous contrast. Angiographic images of the head were obtained using MRA/MRV technique without contrast.  COMPARISON:  HEAD CT FROM 2 DAYS AGO  FINDINGS: MRI HEAD FINDINGS  Calvarium and upper cervical spine: No focal marrow signal abnormality.  Orbits: No significant findings.  Sinuses and Mastoids: Mild scattered mucosal thickening in the paranasal sinuses. No acute sinusitis.  Brain: No acute or remote infarct, hemorrhage, hydrocephalus, or intra-axial mass lesion. No evidence of large vessel occlusion.  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 Fatty content confirmed on previous head CT.  MRA HEAD FINDINGS  Bilateral dominant AICA. There is a large left posterior communicating artery which duplicates the left PCA. The anterior circulation contribution serves the more inferior distribution of the left PCA. Symmetric carotids. Intact anterior communicating artery.  No stenosis, aneurysm, or evidence of vascular malformation.  MRV HEAD FINDINGS  2D and 3D techniques were performed. There is no evidence of dural venous sinus thrombosis, stenosis, or occlusion. No indication of deep or cortical venous thrombosis.  IMPRESSION: 1. Negative brain MRI and intracranial MRA/MRV. 2. Incidental, small left perimedullary lipoma.   Electronically Signed   By: Marnee Spring M.D.   On:  03/01/2015 11:13    Medications:  Scheduled: . cefTRIAXone (ROCEPHIN)  IV  2 g Intravenous Q12H  . heparin  5,000 Units Subcutaneous 3 times per day  . hydrocortisone cream   Topical BID  . ketorolac  30 mg Intravenous 3 times per day  . topiramate  25 mg Oral QHS  . vancomycin  1,000 mg Intravenous Q8H    Assessment/Plan:  34 YO  male with 3 days of HA with associated photophobia and phonophobia.CT head and LP non revealing. Likely Migraine HA exacerbated by underlying viral infection.   -continue topamax 25mg  qhs -suggest adding Cambia prn upon discharge for symptomatic relief of breakthrough headache -follow up with outpatient neurology in 2-4 weeks.  -no further inpatient neurological workup at this time   LOS: 3 days   Elspeth Cho, DO [facility] [phone]  If 7pm- 7am, please page neurology on call as listed in AMION. 03/02/2015  7:56 AM

## 2015-03-02 NOTE — Progress Notes (Signed)
Discharge instructions gone over with patient. Home medications gone over. Prescriptions given. Follow up appointments to be made. Diet, activity, and reasons to call the doctor gone over. My chart discussed. Patient verbalized understanding of instructions.

## 2015-03-02 NOTE — Discharge Summary (Signed)
PATIENT DETAILS Name: Shawn Schaefer Age: 34 y.o. Sex: male Date of Birth: 1981-02-14 MRN: [id number]. Admitting Physician: Kalman Shan, MD PCP:No primary care provider on file.  Admit Date: 02/27/2015 Discharge date: 03/02/2015  Recommendations for Outpatient Follow-up:  1. Ehrlichia serologies pending-please follow  2. Please follow blood/CSF cultures till final  PRIMARY DISCHARGE DIAGNOSIS:  Active Problems:   Sepsis   Severe sepsis   Lactic acidosis   Headache   Hypothermia   Aneurysm   HA (headache)      PAST MEDICAL HISTORY: Past Medical History  Diagnosis Date  . Suicide attempt   . Depression   . Anxiety     DISCHARGE MEDICATIONS: Current Discharge Medication List    START taking these medications   Details  Diclofenac Potassium (CAMBIA) 50 MG PACK 50 mg orally once daily as needed for headaches Qty: 9 each, Refills: 0    topiramate (TOPAMAX) 25 MG tablet Take 1 tablet (25 mg total) by mouth at bedtime. Qty: 60 tablet, Refills: 0      STOP taking these medications     aspirin-acetaminophen-caffeine (EXCEDRIN MIGRAINE) 250-250-65 MG per tablet      ibuprofen (ADVIL,MOTRIN) 200 MG tablet         ALLERGIES:  No Known Allergies  BRIEF HPI:  See H&P, Labs, Consult and Test reports for all details in brief, patient was admitted for evaluation of headache and fever.   CONSULTATIONS:   pulmonary/intensive care, ID and neurology  PERTINENT RADIOLOGIC STUDIES: Ct Head Wo Contrast  02/27/2015   CLINICAL DATA:  Headache. Vomiting. Nausea, vomiting, and diarrhea. Atrial fibrillation. History of migraines.  EXAM: CT HEAD WITHOUT CONTRAST  TECHNIQUE: Contiguous axial images were obtained from the base of the skull through the vertex without intravenous contrast.  COMPARISON:  None.  FINDINGS: There is no evidence of acute cortical infarct, intracranial hemorrhage, mass, midline shift, or extra-axial fluid collection. Ventricles and sulci are normal.  Gray-white differentiation is preserved.  Orbits are unremarkable. Mild mucosal thickening is partially visualized in the right frontal sinus and both maxillary sinuses. Mastoid air cells are clear. No skull fracture.  IMPRESSION: Unremarkable CT appearance of the brain.   Electronically Signed   By: Sebastian Ache M.D.   On: 02/27/2015 12:25   Mr Maxine Glenn Head Wo Contrast  03/01/2015   CLINICAL DATA:  THREE DAYS OF HEADACHE WITH PHOTOPHOBIA AND PHOTOPHOBIA. DIZZINESS.  EXAM: MRI HEAD WITHOUT CONTRAST  MRA HEAD WITHOUT CONTRAST  MRV HEAD WITHOUT CONTRAST  TECHNIQUE: Multiplanar, multiecho pulse sequences of the brain and surrounding structures were obtained without intravenous contrast. Angiographic images of the head were obtained using MRA/MRV technique without contrast.  COMPARISON:  HEAD CT FROM 2 DAYS AGO  FINDINGS: MRI HEAD FINDINGS  Calvarium and upper cervical spine: No focal marrow signal abnormality.  Orbits: No significant findings.  Sinuses and Mastoids: Mild scattered mucosal thickening in the paranasal sinuses. No acute sinusitis.  Brain: No acute or remote infarct, hemorrhage, hydrocephalus, or intra-axial mass lesion. No evidence of large vessel occlusion.  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 Fatty content confirmed on previous head CT.  MRA HEAD FINDINGS  Bilateral dominant AICA. There is a large left posterior communicating artery which duplicates the left PCA. The anterior circulation contribution serves the more inferior distribution of the left PCA. Symmetric carotids. Intact anterior communicating artery.  No stenosis, aneurysm, or evidence of vascular malformation.  MRV HEAD FINDINGS  2D  and 3D techniques were performed. There is no evidence of dural venous sinus thrombosis, stenosis, or occlusion. No indication of deep or cortical venous thrombosis.  IMPRESSION: 1. Negative brain MRI and intracranial MRA/MRV. 2. Incidental, small left perimedullary  lipoma.   Electronically Signed   By: Marnee Spring M.D.   On: 03/01/2015 11:13   Mr Brain Wo Contrast  03/01/2015   CLINICAL DATA:  THREE DAYS OF HEADACHE WITH PHOTOPHOBIA AND PHOTOPHOBIA. DIZZINESS.  EXAM: MRI HEAD WITHOUT CONTRAST  MRA HEAD WITHOUT CONTRAST  MRV HEAD WITHOUT CONTRAST  TECHNIQUE: Multiplanar, multiecho pulse sequences of the brain and surrounding structures were obtained without intravenous contrast. Angiographic images of the head were obtained using MRA/MRV technique without contrast.  COMPARISON:  HEAD CT FROM 2 DAYS AGO  FINDINGS: MRI HEAD FINDINGS  Calvarium and upper cervical spine: No focal marrow signal abnormality.  Orbits: No significant findings.  Sinuses and Mastoids: Mild scattered mucosal thickening in the paranasal sinuses. No acute sinusitis.  Brain: No acute or remote infarct, hemorrhage, hydrocephalus, or intra-axial mass lesion. No evidence of large vessel occlusion.  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 Fatty content confirmed on previous head CT.  MRA HEAD FINDINGS  Bilateral dominant AICA. There is a large left posterior communicating artery which duplicates the left PCA. The anterior circulation contribution serves the more inferior distribution of the left PCA. Symmetric carotids. Intact anterior communicating artery.  No stenosis, aneurysm, or evidence of vascular malformation.  MRV HEAD FINDINGS  2D and 3D techniques were performed. There is no evidence of dural venous sinus thrombosis, stenosis, or occlusion. No indication of deep or cortical venous thrombosis.  IMPRESSION: 1. Negative brain MRI and intracranial MRA/MRV. 2. Incidental, small left perimedullary lipoma.   Electronically Signed   By: Marnee Spring M.D.   On: 03/01/2015 11:13   Mr Susie Cassette Head  03/01/2015   CLINICAL DATA:  THREE DAYS OF HEADACHE WITH PHOTOPHOBIA AND PHOTOPHOBIA. DIZZINESS.  EXAM: MRI HEAD WITHOUT CONTRAST  MRA HEAD WITHOUT CONTRAST  MRV  HEAD WITHOUT CONTRAST  TECHNIQUE: Multiplanar, multiecho pulse sequences of the brain and surrounding structures were obtained without intravenous contrast. Angiographic images of the head were obtained using MRA/MRV technique without contrast.  COMPARISON:  HEAD CT FROM 2 DAYS AGO  FINDINGS: MRI HEAD FINDINGS  Calvarium and upper cervical spine: No focal marrow signal abnormality.  Orbits: No significant findings.  Sinuses and Mastoids: Mild scattered mucosal thickening in the paranasal sinuses. No acute sinusitis.  Brain: No acute or remote infarct, hemorrhage, hydrocephalus, or intra-axial mass lesion. No evidence of large vessel occlusion.  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 Fatty content confirmed on previous head CT.  MRA HEAD FINDINGS  Bilateral dominant AICA. There is a large left posterior communicating artery which duplicates the left PCA. The anterior circulation contribution serves the more inferior distribution of the left PCA. Symmetric carotids. Intact anterior communicating artery.  No stenosis, aneurysm, or evidence of vascular malformation.  MRV HEAD FINDINGS  2D and 3D techniques were performed. There is no evidence of dural venous sinus thrombosis, stenosis, or occlusion. No indication of deep or cortical venous thrombosis.  IMPRESSION: 1. Negative brain MRI and intracranial MRA/MRV. 2. Incidental, small left perimedullary lipoma.   Electronically Signed   By: Marnee Spring M.D.   On: 03/01/2015 11:13   Ct Abdomen Pelvis W Contrast  02/27/2015  CLINICAL DATA:  Abdominal pain with nausea and vomiting. Elevated white blood count. Headache.  EXAM: CT ABDOMEN AND PELVIS WITH CONTRAST  TECHNIQUE: Multidetector CT imaging of the abdomen and pelvis was performed using the standard protocol following bolus administration of intravenous contrast.  CONTRAST:  100 cc Omnipaque 300  COMPARISON:  None.  FINDINGS: Lower chest:  Normal.  Hepatobiliary:  Normal.  Pancreas: Normal.  Spleen: Normal.  Adrenals/Urinary Tract: Congenital horseshoe kidney. No obstruction. 10 mm cyst on the upper pole of the left side of the horseshoe kidney. Adrenal glands are normal. Bladder is normal.  Stomach/Bowel: Normal including the terminal ileum and appendix.  Vascular/Lymphatic: Normal.  Reproductive: Normal.  Other: No free air or free fluid. There are several small bubbles of air in the spinal canal and in the posterior paraspinal soft tissues, probably from recent lumbar puncture.  Musculoskeletal: Normal.  IMPRESSION: Benign appearing abdomen and pelvis.  Congenital horseshoe kidney.   Electronically Signed   By: Francene Boyers M.D.   On: 02/27/2015 16:59   Dg Chest Port 1 View  02/27/2015   CLINICAL DATA:  Shortness of breath and atrial fibrillation  EXAM: PORTABLE CHEST - 1 VIEW  COMPARISON:  April 13, 2008  FINDINGS: Lungs are clear. Heart size and pulmonary vascularity are normal. No adenopathy. No bone lesions.  IMPRESSION: No edema or consolidation.   Electronically Signed   By: Bretta Bang III M.D.   On: 02/27/2015 12:02   Dg Lumbar Puncture Fluoro Guide  02/27/2015   CLINICAL DATA:  Headache and vomiting. Sepsis. Evaluate for meningitis  EXAM: DIAGNOSTIC LUMBAR PUNCTURE UNDER FLUOROSCOPIC GUIDANCE  FLUOROSCOPY TIME:  Radiation Exposure Index (as provided by the fluoroscopic device): 1.4 mGy entrance dose  If the device does not provide the exposure index:  Fluoroscopy Time (in minutes and seconds):  0.6 minutes  Number of Acquired Images:  0  PROCEDURE: Informed consent was obtained from the patient and his mother prior to the procedure by the ER staff (from prior attempt), including potential complications of headache, allergy, and pain. With the patient prone, the lower back was prepped with Betadine. 1% Lidocaine was used for local anesthesia. Lumbar puncture was performed at the L3-4 level using a 20 gauge needle with return of clear CSF. 11 ml of CSF  were obtained for laboratory studies. The patient tolerated the procedure well and there were no apparent complications.  IMPRESSION: Successful lumbar puncture.   Electronically Signed   By: Marnee Spring M.D.   On: 02/27/2015 17:50     PERTINENT LAB RESULTS: CBC:  Recent Labs  02/28/15 0245 03/02/15 0407  WBC 15.3* 13.3*  HGB 13.3 14.5  HCT 39.0 42.7  PLT 186 169   CMET CMP     Component Value Date/Time   NA 140 02/28/2015 1647   K 3.9 02/28/2015 1647   CL 107 02/28/2015 1647   CO2 25 02/28/2015 1647   GLUCOSE 153* 02/28/2015 1647   BUN 10 02/28/2015 1647   CREATININE 0.95 02/28/2015 1647   CALCIUM 9.2 02/28/2015 1647   PROT 6.7 02/27/2015 1041   ALBUMIN 4.3 02/27/2015 1041   AST 26 02/27/2015 1041   ALT 29 02/27/2015 1041   ALKPHOS 62 02/27/2015 1041   BILITOT 0.6 02/27/2015 1041   GFRNONAA >60 02/28/2015 1647   GFRAA >60 02/28/2015 1647    GFR Estimated Creatinine Clearance: 111.6 mL/min (by C-G formula based on Cr of 0.95).  Recent Labs  02/27/15 1034 02/27/15 1041 02/27/15 1418  LIPASE 22 21*  --   AMYLASE  --   --  30    Recent Labs  02/27/15 1406 02/27/15 1418 02/27/15 1845 02/28/15 0157  CKTOTAL 63  --   --   --   CKMB 3.2  --   --   --   TROPONINI  --  0.03 0.11* 0.10*   Invalid input(s): POCBNP No results for input(s): DDIMER in the last 72 hours. No results for input(s): HGBA1C in the last 72 hours. No results for input(s): CHOL, HDL, LDLCALC, TRIG, CHOLHDL, LDLDIRECT in the last 72 hours.  Recent Labs  02/27/15 1048  TSH 1.538   No results for input(s): VITAMINB12, FOLATE, FERRITIN, TIBC, IRON, RETICCTPCT in the last 72 hours. Coags:  Recent Labs  02/27/15 1034  INR 1.01   Microbiology: Recent Results (from the past 240 hour(s))  Culture, blood (routine x 2)     Status: None (Preliminary result)   Collection Time: 02/27/15 10:10 AM  Result Value Ref Range Status   Specimen Description BLOOD RIGHT HAND  Final   Special  Requests BOTTLES DRAWN AEROBIC ONLY 2CC  Final   Culture NO GROWTH 2 DAYS  Final   Report Status PENDING  Incomplete  Culture, blood (routine x 2)     Status: None (Preliminary result)   Collection Time: 02/27/15 10:15 AM  Result Value Ref Range Status   Specimen Description BLOOD LEFT HAND  Final   Special Requests BOTTLES DRAWN AEROBIC ONLY 2CC  Final   Culture NO GROWTH 2 DAYS  Final   Report Status PENDING  Incomplete  CSF culture with Stat gram stain     Status: None (Preliminary result)   Collection Time: 02/27/15  6:56 PM  Result Value Ref Range Status   Specimen Description CSF  Final   Special Requests Normal  Final   Gram Stain CYTOSPIN SMEAR NO WBC SEEN NO ORGANISMS SEEN   Final   Culture NO GROWTH 2 DAYS  Final   Report Status PENDING  Incomplete  MRSA PCR Screening     Status: None   Collection Time: 02/27/15  7:33 PM  Result Value Ref Range Status   MRSA by PCR NEGATIVE NEGATIVE Final    Comment:        The GeneXpert MRSA Assay (FDA approved for NASAL specimens only), is one component of a comprehensive MRSA colonization surveillance program. It is not intended to diagnose MRSA infection nor to guide or monitor treatment for MRSA infections.      BRIEF HOSPITAL COURSE:  1. Sepsis:etiology unknown, but suspision for a viral syndrome. CSF/Blood cultures negative so far. RMSF/Hepatitis Serology/HIV neg.Was on  empiric Vanco/Rocephin/Ampicillin. Blood/CSF cultures neg so far. Spoke with ID-Dr Joellyn Rued to stop all Abx and d/c home today-per ID he does not need any Abx on discharge.  Afebrile,much improved- sepsis pathophysiology resolved-lactic acid levels back to normal. Headaches are consistent with Migraine.  2. Headaches: Patient has a history of migraine headaches x6 months. CT of head unremarkable.MRA/MRV Brain neg.After discussion with Neurology, to start Topamax 25 mg QHS and use prn Cambia for headache. Will require outpatient neurology evaluation, have  sent referral. While inpatient managed with IV Depakote and cocktail of Toradol, promethazine and Benadryl. Much improved, minimal headache at time of discharge.  3. A.Fib with RVR: Back in sinus rhythm.No need for anticoagulation as Chads2vasc score is zero.Echo shows preserved EF 4. 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 Patient complains  has no symptoms today.  TODAY-DAY OF DISCHARGE:  Subjective:   Xavyer Steenson today has no chest abdominal pain,no new weakness tingling or numbness, feels much better wants to go home today.   Objective:   Blood pressure 131/79, pulse 60, temperature 98.1 F (36.7 C), temperature source Oral, resp. rate 19, height 5\' 9"  (1.753 m), weight 81.149 kg (178 lb 14.4 oz), SpO2 99 %.  Intake/Output Summary (Last 24 hours) at 03/02/15 0855 Last data filed at 03/02/15 0300  Gross per 24 hour  Intake    250 ml  Output    700 ml  Net   -450 ml   Filed Weights   02/28/15 0500 03/01/15 0500 03/01/15 2100  Weight: 78.8 kg (173 lb 11.6 oz) 80.9 kg (178 lb 5.6 oz) 81.149 kg (178 lb 14.4 oz)    Exam Awake Alert, Oriented *3, No new F.N deficits, Normal affect [place].AT,PERRAL Supple Neck,No JVD, No cervical lymphadenopathy appriciated.  Symmetrical Chest wall movement, Good air movement bilaterally, CTAB RRR,No Gallops,Rubs or new Murmurs, No Parasternal Heave +ve B.Sounds, Abd Soft, Non tender, No organomegaly appriciated, No rebound -guarding or rigidity. No Cyanosis, Clubbing or edema, No new Rash or bruise  DISCHARGE CONDITION: Stable  DISPOSITION: Home  DISCHARGE INSTRUCTIONS:    Activity:  As tolerated   Get Medicines reviewed and adjusted: Please take all your medications with you for your next visit with your Primary MD  Please request your Primary MD to go over all hospital tests and procedure/radiological results at the follow up, please ask your Primary MD to get all  Hospital records sent to his/her office.  If you experience worsening of your admission symptoms, develop shortness of breath, life threatening emergency, suicidal or homicidal thoughts you must seek medical attention immediately by calling 911 or calling your MD immediately  if symptoms less severe.  You must read complete instructions/literature along with all the possible adverse reactions/side effects for all the Medicines you take and that have been prescribed to you. Take any new Medicines after you have completely understood and accpet all the possible adverse reactions/side effects.   Do not drive when taking Pain medications.   Do not take more than prescribed Pain, Sleep and Anxiety Medications  Special Instructions: If you have smoked or chewed Tobacco  in the last 2 yrs please stop smoking, stop any regular Alcohol  and or any Recreational drug use.  Wear Seat belts while driving.  Please note  You were cared for by a hospitalist during your hospital stay. Once you are discharged, your primary care physician will handle any further medical issues. Please note that NO REFILLS for any discharge medications will be authorized once you are discharged, as it is imperative that you return to your primary care physician (or establish a relationship with a primary care physician if you do not have one) for your aftercare needs so that they can reassess your need for medications and monitor your lab values.  Diet recommendation: Regular Diet  Discharge Instructions    Ambulatory referral to Neurology    Complete by:  As directed   Intractable migraine headache     Call MD for:  persistant nausea and vomiting    Complete by:  As directed      Call MD for:    Complete by:  As directed   Recurrent headache     Diet general    Complete by:  As directed      Increase activity slowly  Complete by:  As directed            Follow-up Information    Follow up with [facility]    . Call in 1 week.   Why:  please let them know you were discharged from [facility] hospital. Please call and make appt   Contact information:   [address] [place] [address] [phone]      Follow up with [facility].   Why:  office will call you, please call the office if you do not hear from them in the next few days.    Contact information:   [address] [place] [address] [phone]      Total Time spent on discharge equals 25  minutes.  SignedJeoffrey Massed 03/02/2015 8:55 AM

## 2015-03-03 LAB — CSF CULTURE

## 2015-03-03 LAB — CSF CULTURE W GRAM STAIN
Culture: NO GROWTH
Special Requests: NORMAL

## 2015-03-04 LAB — CULTURE, BLOOD (ROUTINE X 2)
CULTURE: NO GROWTH
Culture: NO GROWTH

## 2015-03-06 LAB — URINE DRUGS OF ABUSE SCREEN W ALC, ROUTINE (REF LAB)
AMPHETAMINES, URINE: NEGATIVE ng/mL
BARBITURATE, UR: NEGATIVE ng/mL
BENZODIAZEPINE QUANT UR: NEGATIVE ng/mL
Cocaine (Metab.): NEGATIVE ng/mL
ETHANOL U, QUAN: NEGATIVE %
Methadone Screen, Urine: NEGATIVE ng/mL
OPIATE QUANT UR: NEGATIVE ng/mL
PROPOXYPHENE, URINE: NEGATIVE ng/mL
Phencyclidine, Ur: NEGATIVE ng/mL

## 2015-03-06 LAB — ROCKY MTN SPOTTED FVR ABS PNL(IGG+IGM)
RMSF IGG: NEGATIVE
RMSF IgM: 0.76 index (ref 0.00–0.89)

## 2015-03-06 LAB — EHRLICHIA ANTIBODY PANEL
E CHAFFEENSIS AB, IGG: NEGATIVE
E CHAFFEENSIS AB, IGM: NEGATIVE
E. Chaffeensis (HME) IgM Titer: NEGATIVE
E.Chaffeensis (HME) IgG: NEGATIVE

## 2015-03-06 LAB — PANEL 799049
CARBOXY THC GC/MS CONF: 159 ng/mL
Cannabinoid GC/MS, Ur: POSITIVE — AB

## 2015-03-08 LAB — HIV-1 RNA QUANT-NO REFLEX-BLD
HIV 1 RNA Quant: <20 copies/mL
LOG10 HIV-1 RNA: UNDETERMINED log10copy/mL

## 2015-04-11 ENCOUNTER — Ambulatory Visit: Payer: Self-pay | Admitting: Neurology

## 2016-06-03 ENCOUNTER — Emergency Department (HOSPITAL_COMMUNITY): Payer: 59

## 2016-06-03 ENCOUNTER — Emergency Department (HOSPITAL_COMMUNITY)
Admission: EM | Admit: 2016-06-03 | Discharge: 2016-06-03 | Disposition: A | Discharge status: Home / Self Care | Payer: 59 | Attending: Emergency Medicine | Admitting: Emergency Medicine

## 2016-06-03 DIAGNOSIS — G43809 Other migraine, not intractable, without status migrainosus: Secondary | ICD-10-CM | POA: Insufficient documentation

## 2016-06-03 DIAGNOSIS — Z87891 Personal history of nicotine dependence: Secondary | ICD-10-CM | POA: Diagnosis not present

## 2016-06-03 DIAGNOSIS — G43909 Migraine, unspecified, not intractable, without status migrainosus: Secondary | ICD-10-CM | POA: Diagnosis present

## 2016-06-03 LAB — CBC
HCT: 45.3 % (ref 39.0–52.0)
Hemoglobin: 15.4 g/dL (ref 13.0–17.0)
MCH: 28.9 pg (ref 26.0–34.0)
MCHC: 34 g/dL (ref 30.0–36.0)
MCV: 85.2 fL (ref 78.0–100.0)
PLATELETS: 221 10*3/uL (ref 150–400)
RBC: 5.32 MIL/uL (ref 4.22–5.81)
RDW: 12.9 % (ref 11.5–15.5)
WBC: 14.7 10*3/uL — AB (ref 4.0–10.5)

## 2016-06-03 LAB — BASIC METABOLIC PANEL
ANION GAP: 9 (ref 5–15)
BUN: 9 mg/dL (ref 6–20)
CALCIUM: 9.1 mg/dL (ref 8.9–10.3)
CO2: 23 mmol/L (ref 22–32)
CREATININE: 0.91 mg/dL (ref 0.61–1.24)
Chloride: 108 mmol/L (ref 101–111)
GFR calc Af Amer: >60 mL/min (ref >60)
GFR calc non Af Amer: >60 mL/min (ref >60)
GLUCOSE: 100 mg/dL — AB (ref 65–99)
POTASSIUM: 5.1 mmol/L (ref 3.5–5.1)
SODIUM: 140 mmol/L (ref 135–145)

## 2016-06-03 MED ORDER — DIAZEPAM 5 MG PO TABS
5.0000 mg | ORAL_TABLET | Freq: Once | ORAL | Status: AC
Start: 2016-06-03 — End: 2016-06-03
  Administered 2016-06-03: 5 mg via ORAL
  Filled 2016-06-03: qty 1

## 2016-06-03 MED ORDER — ONDANSETRON 8 MG PO TBDP: 8.0000 mg | ORAL_TABLET | Freq: Three times a day (TID) | ORAL | 0 refills | Status: DC | PRN

## 2016-06-03 MED ORDER — PROCHLORPERAZINE EDISYLATE 5 MG/ML IJ SOLN: 10.0000 mg | Freq: Four times a day (QID) | INTRAMUSCULAR | Status: DC | PRN

## 2016-06-03 MED ORDER — KETOROLAC TROMETHAMINE 30 MG/ML IJ SOLN
30.0000 mg | Freq: Once | INTRAMUSCULAR | Status: AC
  Administered 2016-06-03: 30 mg via INTRAVENOUS
  Filled 2016-06-03: qty 1

## 2016-06-03 MED ORDER — SODIUM CHLORIDE 0.9 % IV BOLUS (SEPSIS)
1000.0000 mL | Freq: Once | INTRAVENOUS | Status: AC
  Administered 2016-06-03: 1000 mL via INTRAVENOUS

## 2016-06-03 MED ORDER — MORPHINE SULFATE (PF) 4 MG/ML IV SOLN
4.0000 mg | Freq: Once | INTRAVENOUS | Status: AC
  Administered 2016-06-03: 4 mg via INTRAVENOUS
  Filled 2016-06-03: qty 1

## 2016-06-03 MED ORDER — NAPROXEN SODIUM 220 MG PO TABS: 220.0000 mg | ORAL_TABLET | Freq: Two times a day (BID) | ORAL | 0 refills | Status: DC

## 2016-06-03 MED ORDER — ONDANSETRON HCL 4 MG/2ML IJ SOLN
4.0000 mg | Freq: Once | INTRAMUSCULAR | Status: AC
  Administered 2016-06-03: 4 mg via INTRAVENOUS
  Filled 2016-06-03: qty 2

## 2016-06-03 NOTE — ED Provider Notes (Signed)
[facility]EMER[facility]ENCY DEPT Provider Note   CSN: [id number] Arrival date & time: 06/03/16  1038     History   Chief Complaint Chief Complaint  Patient presents with  . Migraine  . Altered Mental Status    HPI Shawn Schaefer is a 35 y.o. male.  HPI Patient presents to the emergency para with complaints of severe headache.  His headache is been worsening over the past several days and drastically worsened today.  No weakness of his arms or legs.No fevers or chills.  No neck pain or stiffness.   Past Medical History:  Diagnosis Date  . Anxiety   . Depression   . Suicide attempt     Patient Active Problem List   Diagnosis Date Noted  . HA (headache)   . Aneurysm (HCC)   . Sepsis (HCC) 02/27/2015  . Severe sepsis (HCC) 02/27/2015  . Lactic acidosis 02/27/2015  . Headache 02/27/2015  . Hypothermia 02/27/2015  . Social anxiety disorder 10/12/2013  . MDD (major depressive disorder) 10/11/2013  . Major depressive disorder, recurrent episode (HCC) 09/21/2011    Class: Acute  . Substance abuse/dependence 09/21/2011    Class: Chronic  . Suicidal behavior 09/20/2011  . Depression 09/20/2011  . Anger 09/20/2011  . Substance abuse 09/20/2011    No past surgical history on file.     Home Medications    Prior to Admission medications   Medication Sig Start Date End Date Taking? Authorizing Provider  DM-Doxylamine-Acetaminophen (NYQUIL COLD & FLU PO) Take 1 capsule by mouth at bedtime as needed (cold symptoms).   Yes Historical Provider, MD  guaiFENesin (MUCINEX) 600 MG 12 hr tablet Take 600 mg by mouth 2 (two) times daily as needed for cough.   Yes Historical Provider, MD  Homeopathic Products (RA EAR DROPS HOMEOPATHIC OT) Place 1 drop into both ears daily as needed (pain).   Yes Historical Provider, MD  ibuprofen (ADVIL,MOTRIN) 200 MG tablet Take 200 mg by mouth every 6 (six) hours as needed for moderate pain.   Yes Historical Provider, MD  Isopropyl Alcohol (SWIMMERS EAR  DROPS OT) Place 1 drop into both ears daily as needed (fluid).   Yes Historical Provider, MD  Pseudoephedrine-APAP-DM (DAYQUIL MULTI-SYMPTOM COLD/FLU PO) Take 1 capsule by mouth every 8 (eight) hours as needed (cold).   Yes Historical Provider, MD  naproxen sodium (ALEVE) 220 MG tablet Take 1 tablet (220 mg total) by mouth 2 (two) times daily with a meal. 06/03/16   Azalia Bilis, MD  ondansetron (ZOFRAN ODT) 8 MG disintegrating tablet Take 1 tablet (8 mg total) by mouth every 8 (eight) hours as needed for nausea or vomiting. 06/03/16   Azalia Bilis, MD    Family History No family history on file.  Social History Social History  Substance Use Topics  . Smoking status: Former Smoker    Types: Cigarettes  . Smokeless tobacco: Not on file  . Alcohol use 7.2 oz/week    12 Cans of beer per week     Comment: Pt reported that he consumed 2 bottles of wine & pint of wine     Allergies   Patient has no known allergies.   Review of Systems Review of Systems  All other systems reviewed and are negative.    Physical Exam Updated Vital Signs BP 151/88   Pulse 65   Temp 97.5 F (36.4 C) (Oral)   Resp 14   Ht 5\' 10"  (1.778 m)   Wt 175 lb (79.4 kg)   SpO2  98%   BMI 25.11 kg/m   Physical Exam  Constitutional: He is oriented to person, place, and time. He appears well-developed and well-nourished.  HENT:  Head: Normocephalic and atraumatic.  Eyes: EOM are normal.  Neck: Normal range of motion.  Cardiovascular: Normal rate, regular rhythm, normal heart sounds and intact distal pulses.   Pulmonary/Chest: Effort normal and breath sounds normal. No respiratory distress.  Abdominal: Soft. He exhibits no distension. There is no tenderness.  Musculoskeletal: Normal range of motion.  Neurological: He is alert and oriented to person, place, and time.  Normal strength in bilateral upper lower extremity major muscle groups  Skin: Skin is warm and dry.  Psychiatric: He has a normal mood and  affect. Judgment normal.  Nursing note and vitals reviewed.    ED Treatments / Results  Labs (all labs ordered are listed, but only abnormal results are displayed) Labs Reviewed  CBC - Abnormal; Notable for the following:       Result Value   WBC 14.7 (*)    All other components within normal limits  BASIC METABOLIC PANEL - Abnormal; Notable for the following:    Glucose, Bld 100 (*)    All other components within normal limits    EKG  EKG Interpretation None       Radiology Ct Head Wo Contrast  Result Date: 06/03/2016 CLINICAL DATA:  Migraine beginning this morning, headache, vomiting EXAM: CT HEAD WITHOUT CONTRAST TECHNIQUE: Contiguous axial images were obtained from the base of the skull through the vertex without intravenous contrast. COMPARISON:  02/27/2015 FINDINGS: Brain: Normal ventricular morphology. No midline shift or mass effect. Normal appearance of brain parenchyma. No intracranial hemorrhage, mass lesion, or evidence acute infarction. No extra-axial fluid collections. Vascular: Unremarkable Skull: Intact.  Incomplete posterior arch C1. Sinuses/Orbits: Chronic opacity at the far RIGHT lateral aspect of the frontal sinus unchanged. Mucosal thickening BILATERAL maxillary sinuses, mild. Other: N/A IMPRESSION: No acute intracranial abnormalities. Electronically Signed   By: Ulyses SouthwardMark  Boles M.D.   On: 06/03/2016 12:11    Procedures Procedures (including critical care time)  Medications Ordered in ED Medications  prochlorperazine (COMPAZINE) injection 10 mg (not administered)  sodium chloride 0.9 % bolus 1,000 mL (1,000 mLs Intravenous New Bag/Given 06/03/16 1158)  ondansetron (ZOFRAN) injection 4 mg (4 mg Intravenous Given 06/03/16 1152)  morphine 4 MG/ML injection 4 mg (4 mg Intravenous Given 06/03/16 1152)  ondansetron (ZOFRAN) injection 4 mg (4 mg Intravenous Given 06/03/16 1336)  diazepam (VALIUM) tablet 5 mg (5 mg Oral Given 06/03/16 1336)  ketorolac (TORADOL) 30  MG/ML injection 30 mg (30 mg Intravenous Given 06/03/16 1336)     Initial Impression / Assessment and Plan / ED Course  I have reviewed the triage vital signs and the nursing notes.  Pertinent labs & imaging results that were available during my care of the patient were reviewed by me and considered in my medical decision making (see chart for details).  Clinical Course     Symptoms improved in emergency department.  Likely migraine variant.  Discharge home with outpatient neurology follow-up.  Patient understands return to the ER for new or worsening symptoms  Final Clinical Impressions(s) / ED Diagnoses   Final diagnoses:  Other migraine without status migrainosus, not intractable    New Prescriptions New Prescriptions   NAPROXEN SODIUM (ALEVE) 220 MG TABLET    Take 1 tablet (220 mg total) by mouth 2 (two) times daily with a meal.   ONDANSETRON (ZOFRAN ODT) 8 MG DISINTEGRATING  TABLET    Take 1 tablet (8 mg total) by mouth every 8 (eight) hours as needed for nausea or vomiting.     Azalia BilisKevin Chalsea Darko, MD 06/05/16 [phone]

## 2016-06-03 NOTE — ED Triage Notes (Signed)
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 Vomiting with migraine and diaphoretic. Patient states this last happened about a year ago and he was admitted to ICU. Patient denies hx of seizures and meningitis and stiff neck. Patient states last time this happen he went into afib. EMS ekg unremarkable. Patient is drowsy but oriented. Patient has sensitivity to light and sound. EMS got 20 in LW. 4mg  of zofran given. EMS v/s 54-60 HR, 150/76 BP, and 123 fsbs.

## 2016-06-03 NOTE — ED Notes (Signed)
Patient sent to CT.

## 2016-06-04 ENCOUNTER — Encounter (HOSPITAL_COMMUNITY): Payer: Self-pay | Admitting: Emergency Medicine

## 2016-06-04 ENCOUNTER — Emergency Department (HOSPITAL_COMMUNITY)
Admission: EM | Admit: 2016-06-04 | Discharge: 2016-06-04 | Disposition: A | Discharge status: Home / Self Care | Payer: 59 | Attending: Emergency Medicine | Admitting: Emergency Medicine

## 2016-06-04 DIAGNOSIS — R51 Headache: Secondary | ICD-10-CM | POA: Diagnosis present

## 2016-06-04 DIAGNOSIS — G43009 Migraine without aura, not intractable, without status migrainosus: Secondary | ICD-10-CM | POA: Diagnosis not present

## 2016-06-04 DIAGNOSIS — Z7982 Long term (current) use of aspirin: Secondary | ICD-10-CM | POA: Diagnosis not present

## 2016-06-04 DIAGNOSIS — Z79899 Other long term (current) drug therapy: Secondary | ICD-10-CM | POA: Insufficient documentation

## 2016-06-04 DIAGNOSIS — Z87891 Personal history of nicotine dependence: Secondary | ICD-10-CM | POA: Insufficient documentation

## 2016-06-04 HISTORY — DX: Migraine, unspecified, not intractable, without status migrainosus: G43.909

## 2016-06-04 MED ORDER — MAGNESIUM SULFATE 2 GM/50ML IV SOLN: 2.0000 g | Freq: Once | INTRAVENOUS | Status: DC

## 2016-06-04 MED ORDER — MORPHINE SULFATE (PF) 4 MG/ML IV SOLN
4.0000 mg | Freq: Once | INTRAVENOUS | Status: AC
  Administered 2016-06-04: 4 mg via INTRAVENOUS
  Filled 2016-06-04: qty 1

## 2016-06-04 MED ORDER — KETOROLAC TROMETHAMINE 15 MG/ML IJ SOLN
15.0000 mg | Freq: Once | INTRAMUSCULAR | Status: AC
  Administered 2016-06-04: 15 mg via INTRAVENOUS
  Filled 2016-06-04: qty 1

## 2016-06-04 MED ORDER — DEXAMETHASONE SODIUM PHOSPHATE 10 MG/ML IJ SOLN
10.0000 mg | Freq: Once | INTRAMUSCULAR | Status: AC
  Administered 2016-06-04: 10 mg via INTRAVENOUS
  Filled 2016-06-04: qty 1

## 2016-06-04 MED ORDER — PROCHLORPERAZINE EDISYLATE 5 MG/ML IJ SOLN
10.0000 mg | Freq: Once | INTRAMUSCULAR | Status: AC
  Administered 2016-06-04: 10 mg via INTRAVENOUS
  Filled 2016-06-04: qty 2

## 2016-06-04 MED ORDER — ASPIRIN-ACETAMINOPHEN-CAFFEINE 250-250-65 MG PO TABS: 2.0000 | ORAL_TABLET | Freq: Four times a day (QID) | ORAL | 0 refills | Status: AC | PRN

## 2016-06-04 MED ORDER — DIPHENHYDRAMINE HCL 50 MG/ML IJ SOLN
25.0000 mg | Freq: Once | INTRAMUSCULAR | Status: AC
  Administered 2016-06-04: 25 mg via INTRAVENOUS
  Filled 2016-06-04: qty 1

## 2016-06-04 MED ORDER — SODIUM CHLORIDE 0.9 % IV BOLUS (SEPSIS)
1000.0000 mL | Freq: Once | INTRAVENOUS | Status: AC
  Administered 2016-06-04: 1000 mL via INTRAVENOUS

## 2016-06-04 NOTE — ED Triage Notes (Signed)
Pt seen here yesterday for migraine. Pt was sent home with prescription for pain medication but it has not helped. Pt has hx of severe migraines. Pt is light sensitive and has been vomiting.

## 2016-06-04 NOTE — ED Provider Notes (Signed)
[facility] Provider Note   CSN: [id number] Arrival date & time: 06/04/16  1403  By signing my name below, I, Freida Busman, attest that this documentation has been prepared under the direction and in the presence of Nira Conn, MD . Electronically Signed: Freida Busman, Scribe. 06/04/2016. 4:13 PM.  History   Chief Complaint Chief Complaint  Patient presents with  . Migraine   The history is provided by the patient. No language interpreter was used.    HPI Comments:  Shawn Schaefer is a 35 y.o. male with a history of migraine HA, who presents to the Emergency Department with his wife complaining of a throbbing  HA with constant pain since onset yesterday. His pain is located throughout his head and is similar to past migraines. Pt is not currently on any migraines meds. He has been on migraine meds in the past that have not provided relief.  He gets migraines ~ 1-2 times a year. Wife reports associated photophobia, nausea, and vomiting. He last vomited ~1400 today.  Pt also notes mild SOB and mild abdominal pain. Pt was seen in the ED yesterday for the same and given a migraine cocktail, zofran, and morphine with moderate relief but notes HA had not fully resolved at the time of discharge. No other treatments tried since discharge. Pt was discharge with a neurology referral but has not yet had a chance to make an appointment for follow up. No diarrhea, fever, or rhinorrhea.   Past Medical History:  Diagnosis Date  . Anxiety   . Depression   . Migraines   . Suicide attempt     Patient Active Problem List   Diagnosis Date Noted  . HA (headache)   . Aneurysm (HCC)   . Sepsis (HCC) 02/27/2015  . Severe sepsis (HCC) 02/27/2015  . Lactic acidosis 02/27/2015  . Headache 02/27/2015  . Hypothermia 02/27/2015  . Social anxiety disorder 10/12/2013  . MDD (major depressive disorder) 10/11/2013  . Major depressive disorder, recurrent episode (HCC) 09/21/2011    Class:  Acute  . Substance abuse/dependence 09/21/2011    Class: Chronic  . Suicidal behavior 09/20/2011  . Depression 09/20/2011  . Anger 09/20/2011  . Substance abuse 09/20/2011    History reviewed. No pertinent surgical history.     Home Medications    Prior to Admission medications   Medication Sig Start Date End Date Taking? Authorizing Provider  aspirin-acetaminophen-caffeine (EXCEDRIN MIGRAINE) [phone] MG tablet Take 2 tablets by mouth every 6 (six) hours as needed for headache. 06/04/16 06/11/16  Nira Conn, MD  DM-Doxylamine-Acetaminophen (NYQUIL COLD & FLU PO) Take 1 capsule by mouth at bedtime as needed (cold symptoms).    Historical Provider, MD  guaiFENesin (MUCINEX) 600 MG 12 hr tablet Take 600 mg by mouth 2 (two) times daily as needed for cough.    Historical Provider, MD  Homeopathic Products (RA EAR DROPS HOMEOPATHIC OT) Place 1 drop into both ears daily as needed (pain).    Historical Provider, MD  ibuprofen (ADVIL,MOTRIN) 200 MG tablet Take 200 mg by mouth every 6 (six) hours as needed for moderate pain.    Historical Provider, MD  Isopropyl Alcohol (SWIMMERS EAR DROPS OT) Place 1 drop into both ears daily as needed (fluid).    Historical Provider, MD  naproxen sodium (ALEVE) 220 MG tablet Take 1 tablet (220 mg total) by mouth 2 (two) times daily with a meal. 06/03/16   Azalia Bilis, MD  ondansetron (ZOFRAN ODT) 8 MG disintegrating tablet  Take 1 tablet (8 mg total) by mouth every 8 (eight) hours as needed for nausea or vomiting. 06/03/16   Azalia BilisKevin Campos, MD  Pseudoephedrine-APAP-DM (DAYQUIL MULTI-SYMPTOM COLD/FLU PO) Take 1 capsule by mouth every 8 (eight) hours as needed (cold).    Historical Provider, MD    Family History History reviewed. No pertinent family history.  Social History Social History  Substance Use Topics  . Smoking status: Former Smoker    Types: Cigarettes  . Smokeless tobacco: Never Used  . Alcohol use 7.2 oz/week    12 Cans of beer  per week     Comment: Pt reported that he consumed 2 bottles of wine & pint of wine     Allergies   Patient has no known allergies.   Review of Systems Review of Systems 10 systems reviewed and all are negative for acute change except as noted in the HPI.  Physical Exam Updated Vital Signs BP (!) 161/103 (BP Location: Left Arm)   Pulse 96   Temp 97.4 F (36.3 C) (Oral)   Resp 14   SpO2 97%   Physical Exam  Constitutional: He is oriented to person, place, and time. He appears well-developed and well-nourished. No distress.  HENT:  Head: Normocephalic and atraumatic.  Nose: Nose normal.  Eyes: Conjunctivae and EOM are normal. Pupils are equal, round, and reactive to light. Right eye exhibits no discharge. Left eye exhibits no discharge. No scleral icterus.  Neck: Normal range of motion. Neck supple.  Cardiovascular: Normal rate, regular rhythm and normal heart sounds.  Exam reveals no gallop and no friction rub.   No murmur heard. Pulmonary/Chest: Effort normal and breath sounds normal. No stridor. No respiratory distress. He has no rales.  Abdominal: Soft. He exhibits no distension. There is no tenderness.  Musculoskeletal: He exhibits no edema or tenderness.  Neurological: He is alert and oriented to person, place, and time.  Mental Status: Alert and oriented to person, place, and time. Attention and concentration normal. Speech clear. Recent memory is intac  Cranial Nerves  II Visual Fields: Intact to confrontation. Visual fields intact. III, IV, VI: Pupils equal and reactive to light and near. Full eye movement without nystagmus  V Facial Sensation: Normal. No weakness of masticatory muscles  VII: No facial weakness or asymmetry  VIII Auditory Acuity: Grossly normal  IX/X: The uvula is midline; the palate elevates symmetrically  XI: Normal sternocleidomastoid and trapezius strength  XII: The tongue is midline. No atrophy or fasciculations.   Motor System: Muscle  Strength: 5/5 and symmetric in the upper and lower extremities. No pronation or drift.  Muscle Tone: Tone and muscle bulk are normal in the upper and lower extremities.   Reflexes: DTRs: 2+ and symmetrical in all four extremities.   Coordination:  No tremor.  Sensation: Intact to light touch Gait: deferred   Skin: Skin is warm and dry. No rash noted. He is not diaphoretic. No erythema.  Psychiatric: He has a normal mood and affect.  Nursing note and vitals reviewed.    ED Treatments / Results  DIAGNOSTIC STUDIES:  Oxygen Saturation is 97% on RA, normal by my interpretation.    COORDINATION OF CARE:  4:10 PM Discussed treatment plan with pt at bedside and pt agreed to plan.  Labs (all labs ordered are listed, but only abnormal results are displayed) Labs Reviewed - No data to display  EKG  EKG Interpretation None       Radiology Ct Head Wo Contrast  Result  Date: 06/03/2016 CLINICAL DATA:  Migraine beginning this morning, headache, vomiting EXAM: CT HEAD WITHOUT CONTRAST TECHNIQUE: Contiguous axial images were obtained from the base of the skull through the vertex without intravenous contrast. COMPARISON:  02/27/2015 FINDINGS: Brain: Normal ventricular morphology. No midline shift or mass effect. Normal appearance of brain parenchyma. No intracranial hemorrhage, mass lesion, or evidence acute infarction. No extra-axial fluid collections. Vascular: Unremarkable Skull: Intact.  Incomplete posterior arch C1. Sinuses/Orbits: Chronic opacity at the far RIGHT lateral aspect of the frontal sinus unchanged. Mucosal thickening BILATERAL maxillary sinuses, mild. Other: N/A IMPRESSION: No acute intracranial abnormalities. Electronically Signed   By: Ulyses Southward M.D.   On: 06/03/2016 12:11    Procedures Procedures (including critical care time)  Medications Ordered in ED Medications  diphenhydrAMINE (BENADRYL) injection 25 mg (25 mg Intravenous Given 06/04/16 1702)  dexamethasone  (DECADRON) injection 10 mg (10 mg Intravenous Given 06/04/16 1701)  prochlorperazine (COMPAZINE) injection 10 mg (10 mg Intravenous Given 06/04/16 1702)  ketorolac (TORADOL) 15 MG/ML injection 15 mg (15 mg Intravenous Given 06/04/16 1828)  sodium chloride 0.9 % bolus 1,000 mL (0 mLs Intravenous Stopped 06/04/16 1907)  morphine 4 MG/ML injection 4 mg (4 mg Intravenous Given 06/04/16 1831)     Initial Impression / Assessment and Plan / ED Course  I have reviewed the triage vital signs and the nursing notes.  Pertinent labs & imaging results that were available during my care of the patient were reviewed by me and considered in my medical decision making (see chart for details).  Clinical Course as of Jun 04 1909  Thu Jun 04, 2016  1614 Typical migraine headache for the pt. seen here for the same yesterday obtain a CT head which was negative. Given migraine cocktail and narcotics with a benzodiazepine which seemed to relieve his pain. Discharged home however pain persisted throughout the night and has become unbearable. Patient does not take anything other than Motrin at home for pain meds.   Non focal neuro exam. No recent head trauma. No fever. Doubt meningitis. Doubt intracranial bleed. Doubt IIH. No indication for imaging. Will treat with migraine cocktail and reevaluate.   [PC]  1630 Some improvement following migraine cocktail with 7/10 pain. Looked pt up on the [employer]arcotics data base and saw no recent Rxs. Given 4mg  Morphine.  [PC]  1907 Near complete resolution of headache and nausea. Pt requested 10 day course of Percocet. He was informed that I do not Rx narcotics for migraine headaches. He was instructed to establish care and follow up with PCP and/or Neurology.   The patient is safe for discharge with strict return precautions.   [PC]    Clinical Course User Index [PC] Nira Conn, MD      Final Clinical Impressions(s) / ED Diagnoses   Final diagnoses:  Migraine  without aura and without status migrainosus, not intractable   Disposition: Discharge  Condition: Good  I have discussed the results, Dx and Tx plan with the patient who expressed understanding and agree(s) with the plan. Discharge instructions discussed at great length. The patient was given strict return precautions who verbalized understanding of the instructions. No further questions at time of discharge.    New Prescriptions   ASPIRIN-ACETAMINOPHEN-CAFFEINE (EXCEDRIN MIGRAINE) 250-250-65 MG TABLET    Take 2 tablets by mouth every 6 (six) hours as needed for headache.    Follow Up: primary care provider  Call  For help establishing care with a care provider   I personally performed the  services described in this documentation, which was scribed in my presence. The recorded information has been reviewed and is accurate.        Nira ConnPedro Eduardo Ladaija Dimino, MD 06/04/16 1910

## 2016-06-10 ENCOUNTER — Encounter: Payer: Self-pay | Admitting: Neurology

## 2016-06-10 ENCOUNTER — Telehealth: Payer: Self-pay | Admitting: Neurology

## 2016-06-10 ENCOUNTER — Ambulatory Visit (INDEPENDENT_AMBULATORY_CARE_PROVIDER_SITE_OTHER): Payer: 59 | Admitting: Neurology

## 2016-06-10 VITALS — BP 142/97 | HR 70 | Resp 20 | Ht 70.0 in | Wt 187.0 lb

## 2016-06-10 DIAGNOSIS — G43001 Migraine without aura, not intractable, with status migrainosus: Secondary | ICD-10-CM

## 2016-06-10 MED ORDER — PROMETHAZINE HCL 25 MG RE SUPP: 25.0000 mg | Freq: Four times a day (QID) | RECTAL | 0 refills | Status: DC | PRN

## 2016-06-10 MED ORDER — RIZATRIPTAN BENZOATE 5 MG PO TABS: 5.0000 mg | ORAL_TABLET | ORAL | 0 refills | Status: DC | PRN

## 2016-06-10 MED ORDER — RIZATRIPTAN BENZOATE 5 MG PO TBDP: 5.0000 mg | ORAL_TABLET | ORAL | 0 refills | Status: DC | PRN

## 2016-06-10 NOTE — Telephone Encounter (Signed)
Pt left abruptly without checking out. Spouse states pt will not be coming back for any future appts.

## 2016-06-10 NOTE — Patient Instructions (Signed)
We will prescribe phenergan suppository for your nausea. Please remember, common headache triggers are: sleep deprivation, dehydration, overheating, stress, hypoglycemia or skipping meals and blood sugar fluctuations, excessive pain medications or excessive alcohol use or caffeine withdrawal. Some people have food triggers such as aged cheese, orange juice or chocolate, especially dark chocolate, or MSG (monosodium glutamate). Try to avoid these headache triggers as much possible. It may be helpful to keep a headache diary to figure out what makes your headaches worse or brings them on and what alleviates them. Some people report headache onset after exercise but studies have shown that regular exercise may actually prevent headaches from coming. If you have exercise-induced headaches, please make sure that you drink plenty of fluid before and after exercising and that you do not over do it and do not overheat. For acute migraine episodes: we will try Maxalt orally disintegrating tab, 5 mg: take 1 pill early on when you suspect a migraine attack come on. You may take another pill within 2 hours, no more than 2 pills in 24 hours. Most people who take triptans do not have any serious side-effects. However, they can cause drowsiness (remember to not drive or use heavy machinery when drowsy), nausea, dizziness, dry mouth. Less common side effects include strange sensations, such as tightness in your chest or throat, tingling, flushing, and feelings of heaviness or pressure in areas such as the face, limbs, and chest. These in the chest can mimic heart related pain (angina) and may cause alarm, but usually these sensations are not harmful or a sign of a heart attack. However, if you develop intense chest pain or sensations of discomfort, you should stop taking your medication and consult with me or your PCP or go to the nearest urgent care facility or ER or call 911.

## 2016-06-10 NOTE — Progress Notes (Signed)
Subjective:    Patient ID: Shawn Schaefer is a 35 y.o. male.  HPI     Huston Foley, MD, PhD [facility] [address] [address] [place], [place] [address]  I saw Mr. Shawn Schaefer is a referral from the emergency room. The patient is accompanied by his wife today. He is a 51 year old right-handed gentleman with an underlying medical history of migraines, anxiety, history of substance abuse, history of suicide attempt, depression, paroxysmal A. fib, history of sepsis for which he was in the hospital last year in September, and a migraines for years, who was seen in the hospital emergency room on 06/03/2016 as well as 06/04/2016 for migraines. On 06/03/2016 he was treated symptomatically with a migraine cocktail including Compazine, IV fluids, Zofran, morphine, Zofran, Valium and Toradol. I reviewed the emergency room records. He was treated with Benadryl, Decadron, Compazine, Toradol, IV fluids and morphine on 06/04/2016. I reviewed the emergency room records. He had a brain MRI without contrast, an MRA head wo contrast, as well as MRV head without contrast on 03/01/2015 which I reviewed: IMPRESSION: 1. Negative brain MRI and intracranial MRA/MRV. 2. Incidental, small left perimedullary lipoma. He had a head CT without contrast on 06/03/2016 which I reviewed: IMPRESSION: No acute intracranial abnormalities.  He reports a migraine history since infancy. He has tried Imitrex which he felt did not work. For nausea, he was given a prescription for Zofran which he felt did not work. He has had better success with Phenergan suppository. He has not been on migraine prevention medication. His migraines are infrequent, typically once or twice per year but can last up to 2 weeks at a time according to his wife. He reports bifrontal throbbing headaches, associated with nausea and vomiting, photophobia and sonophobia. He snores occasionally and mildly per wife, no apneas are  reported. He tries to get enough sleep and tries to hydrate. He does drink coffee 1-3 cups, usually in the mornings. He works at a [employer]. He is a non-smoker but chews tobacco occasionally and smokes marijuana occasionally.   His Past Medical History Is Significant For: Past Medical History:  Diagnosis Date  . Anxiety   . Depression   . Migraines   . Suicide attempt     His Past Surgical History Is Significant For: No past surgical history on file.  His Family History Is Significant For: No family history on file.  His Social History Is Significant For: Social History   Social History  . Marital status: Married    Spouse name: N/A  . Number of children: N/A  . Years of education: N/A   Social History Main Topics  . Smoking status: Former Smoker    Types: Cigarettes  . Smokeless tobacco: Never Used  . Alcohol use 7.2 oz/week    12 Cans of beer per week     Comment: Pt reported that he consumed 2 bottles of wine & pint of wine  . Drug use:     Frequency: 1.0 time per week    Types: Marijuana, Benzodiazepines, Hydrocodone  . Sexual activity: Yes    Birth control/ protection: None   Other Topics Concern  . None   Social History Narrative  . None    His Allergies Are:  No Known Allergies:   His Current Medications Are:  Outpatient Encounter Prescriptions as of 06/10/2016  Medication Sig  . aspirin-acetaminophen-caffeine (EXCEDRIN MIGRAINE) 250-250-65 MG tablet Take 2 tablets by mouth every 6 (six)  hours as needed for headache.  . ibuprofen (ADVIL,MOTRIN) 200 MG tablet Take 200 mg by mouth every 6 (six) hours as needed for moderate pain.  Marland Kitchen. ondansetron (ZOFRAN ODT) 8 MG disintegrating tablet Take 1 tablet (8 mg total) by mouth every 8 (eight) hours as needed for nausea or vomiting.  . promethazine (PHENERGAN) 25 MG suppository Place 1 suppository (25 mg total) rectally every 6 (six) hours as needed for nausea or vomiting.  . rizatriptan (MAXALT-MLT)  5 MG disintegrating tablet Take 1 tablet (5 mg total) by mouth as needed for migraine. May repeat in 2 hours if needed  . [DISCONTINUED] DM-Doxylamine-Acetaminophen (NYQUIL COLD & FLU PO) Take 1 capsule by mouth at bedtime as needed (cold symptoms).  . [DISCONTINUED] guaiFENesin (MUCINEX) 600 MG 12 hr tablet Take 600 mg by mouth 2 (two) times daily as needed for cough.  . [DISCONTINUED] Homeopathic Products (RA EAR DROPS HOMEOPATHIC OT) Place 1 drop into both ears daily as needed (pain).  . [DISCONTINUED] Isopropyl Alcohol (SWIMMERS EAR DROPS OT) Place 1 drop into both ears daily as needed (fluid).  . [DISCONTINUED] naproxen sodium (ALEVE) 220 MG tablet Take 1 tablet (220 mg total) by mouth 2 (two) times daily with a meal.  . [DISCONTINUED] Pseudoephedrine-APAP-DM (DAYQUIL MULTI-SYMPTOM COLD/FLU PO) Take 1 capsule by mouth every 8 (eight) hours as needed (cold).  . [DISCONTINUED] rizatriptan (MAXALT) 5 MG tablet Take 1 tablet (5 mg total) by mouth as needed for migraine. May repeat in 2 hours if needed   No facility-administered encounter medications on file as of 06/10/2016.   : Review of Systems:  Out of a complete 14 point review of systems, all are reviewed and negative with the exception of these symptoms as listed below: Review of Systems  HENT: Positive for tinnitus.   Neurological: Positive for dizziness and headaches.       Pt presents today to discuss his migraines. Pt only gets migraines about twice per year, but when he gets one, they are severe. Pt has to go to ER for relief and even was admitted for five days for a migraine. Pt says that the only thing that helps his migraines is narcotics. Pt says that he has tried excedrin and imitrex for his migraines before but they don't work.    Objective:  Neurologic Exam  Physical Exam Physical Examination:   Vitals:   06/10/16 1455  BP: (!) 142/97  Pulse: 70  Resp: 20   General Examination: The patient is a very pleasant 35 y.o.  male in no acute distress. He appears well-developed and well-nourished and adequately groomed. He appears to be sensitive to light.   HEENT: Normocephalic, atraumatic, pupils are equal, round and reactive to light and accommodation. Funduscopic exam is normal with sharp disc margins noted. Extraocular tracking is good without limitation to gaze excursion or nystagmus noted. Normal smooth pursuit is noted. Hearing is grossly intact. Face is symmetric with normal facial animation and normal facial sensation. Speech is clear with no dysarthria noted. There is no hypophonia. There is no lip, neck/head, jaw or voice tremor. Neck is supple with full range of passive and active motion. There are no carotid bruits on auscultation. Oropharynx exam reveals: mild mouth dryness, adequate dental hygiene and mild airway crowding. Mallampati is class I. Tongue protrudes centrally and palate elevates symmetrically.   Chest: Clear to auscultation without wheezing, rhonchi or crackles noted.  Heart: S1+S2+0, regular and normal without murmurs, rubs or gallops noted.   Abdomen: Soft, non-tender  and non-distended with normal bowel sounds appreciated on auscultation.  Extremities: There is no pitting edema in the distal lower extremities bilaterally. Pedal pulses are intact.  Skin: Warm and dry without trophic changes noted.  Musculoskeletal: exam reveals no obvious joint deformities, tenderness or joint swelling or erythema.   Neurologically:  Mental status: The patient is awake, alert and oriented in all 4 spheres. His immediate and remote memory, attention, language skills and fund of knowledge are appropriate. There is no evidence of aphasia, agnosia, apraxia or anomia. Speech is clear with normal prosody and enunciation. Thought process is linear. Mood is constricted and affect is blunted.  Cranial nerves II - XII are as described above under HEENT exam. In addition: shoulder shrug is normal with equal shoulder  height noted. Motor exam: Normal bulk, strength and tone is noted. There is no drift, tremor or rebound. Romberg is negative. Reflexes are 2+ throughout. Fine motor skills and coordination: intact with normal finger taps, normal hand movements, normal rapid alternating patting, normal foot taps and normal foot agility.  Cerebellar testing: No dysmetria or intention tremor on finger to nose testing. Heel to shin is unremarkable bilaterally. There is no truncal or gait ataxia.  Sensory exam: intact to light touch, pinprick, vibration, temperature sense in the upper and lower extremities.  Gait, station and balance: He stands easily. No veering to one side is noted. No leaning to one side is noted. Posture is age-appropriate and stance is narrow based. Gait shows normal stride length and normal pace. No problems turning are noted. Intact tandem walk.   Assessment and Plan:   In summary, Jeven B Goertz Hassan Buckleris a 35 y.o.-year old male with an underlying medical history of migraines, anxiety, history of substance abuse, history of suicide attempt, depression, paroxysmal A. fib, history of sepsis for which he was in the hospital last year in September, and a migraines for years, who Presents for initial visit for his migraine headaches. He was referred to the emergency room where he presented last week 2 times and was treated symptomatically. He had a head CT without contrast which was negative for any acute findings and furthermore he had MRI scans and MRV as well as MRA last year when he was in the hospital. Neurologically, he is nonfocal. His migraines are infrequent enough to not warrant prophylactic medication. I talked to the patient and his wife about acute migraine management and prevention medication. He has taken over-the-counter Motrin or Excedrin with limited success. He is advised to try Maxalt 5 mg strength orally disintegrating tablet at the onset of a migraine attack and he can repeat this once after 2  hours if needed. He was given a new prescription for this. For his nausea, he did not have success with generic Zofran and was given a prescription for Phenergan suppository. The patient is advised to establish care with a new primary care physician. I did not suggest any new diagnostic testing as he recently had a head CT and MRI scans last year and his exam is nonfocal thankfully. I would not recommend treatment with oral opioids/narcotics for migraines, and explained this to the patient and his wife, as she requested a short prescription for narcotics.  He was given written instructions, also advised him as to typical headache or migraine triggers. I suggested a 6 month FU, sooner as needed.   Huston FoleySaima Katie Moch, MD, PhD

## 2018-03-30 IMAGING — CT CT HEAD W/O CM
4 series · 15 of 47 positions shown, 17 images · non-contrast
Comparison: 02/27/2015

CLINICAL DATA: Migraine beginning this morning, headache, vomiting

EXAM:
CT HEAD WITHOUT CONTRAST
TECHNIQUE: Contiguous axial images were obtained from the base of the skull
through the vertex without intravenous contrast.

[Series 2: head without · axial · non-contrast · 0.44mm/px · z∈[-114,+6]mm · 7 of 32 slices shown, 9 images]
[im 4/32  brain]
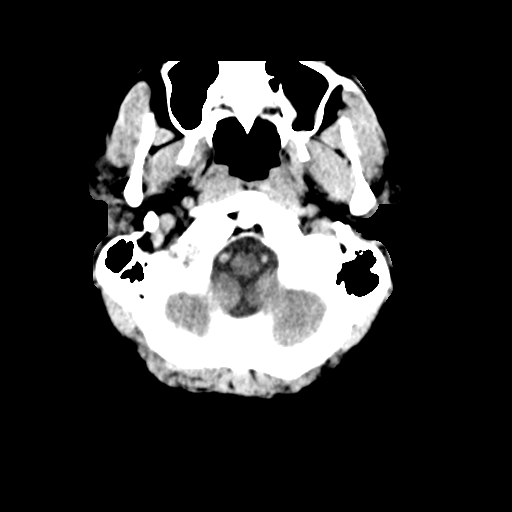
[im 4/32  bone]
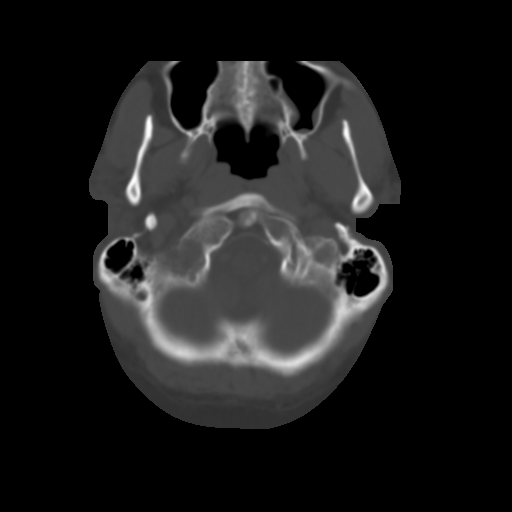
[im 8/32  brain]
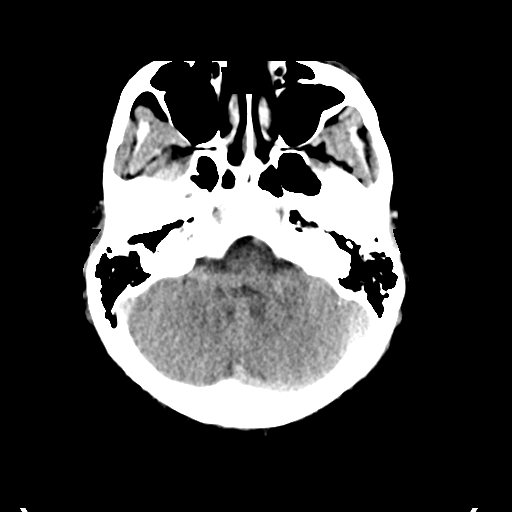
[im 12/32  brain]
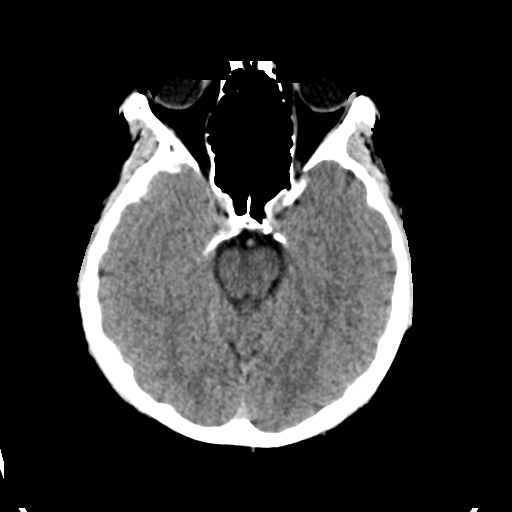
[im 16/32  brain]
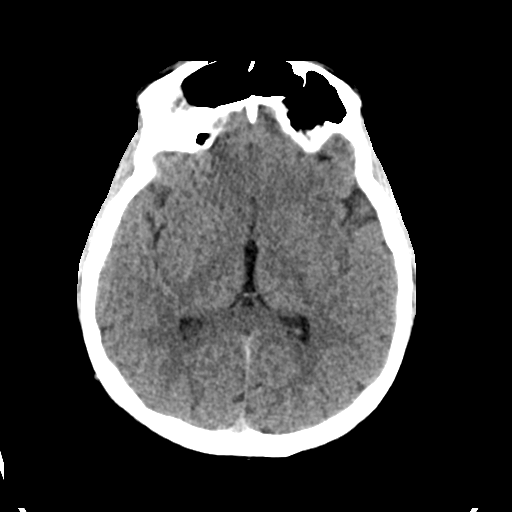
[im 20/32  brain]
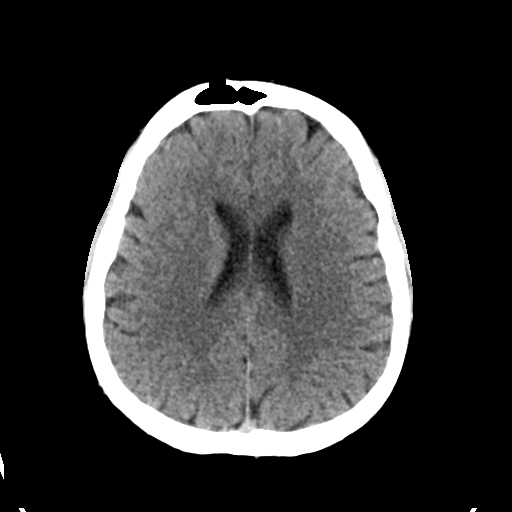
[im 20/32  bone]
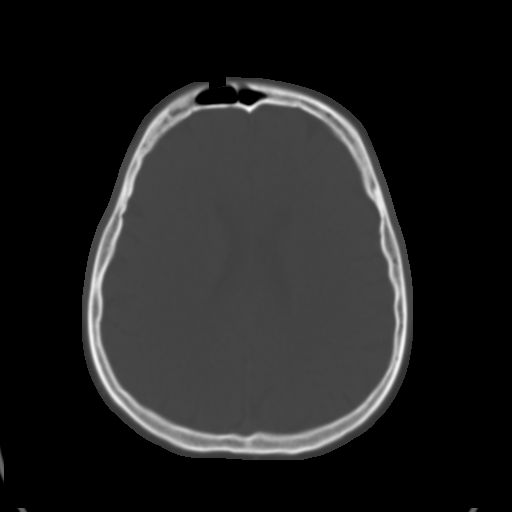
[im 24/32  brain]
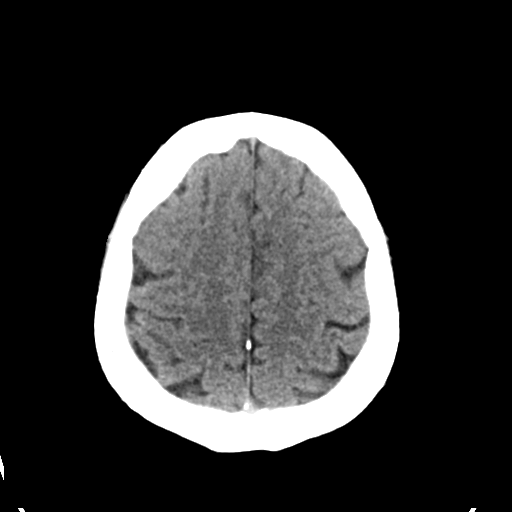
[im 28/32  brain]
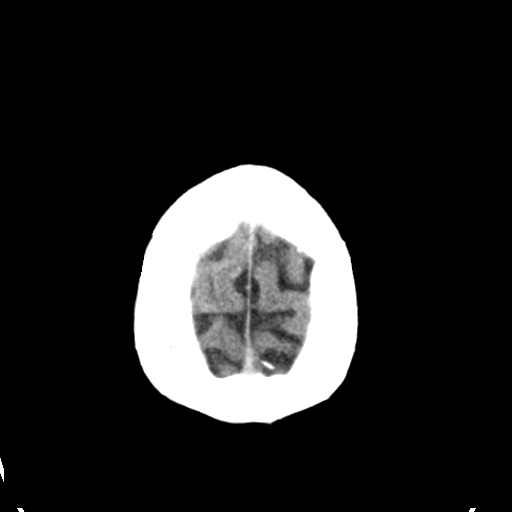

[Series 3: head bone · axial · 0.44mm/px · z∈[-115,-99]mm · 2 of 79 slices shown]
[im 8/79  bone]
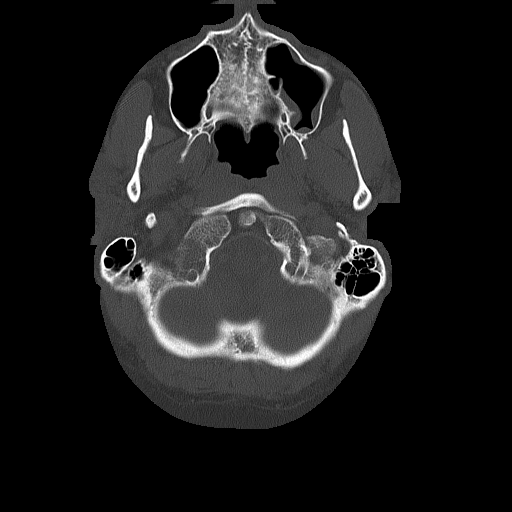
[im 16/79  bone]
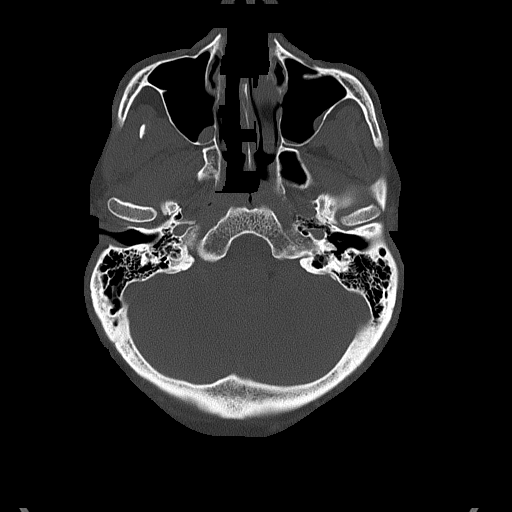

[Series 4: head without cor · coronal · non-contrast · 0.31mm/px · 3 of 65 slices shown]
[im 22/65  brain]
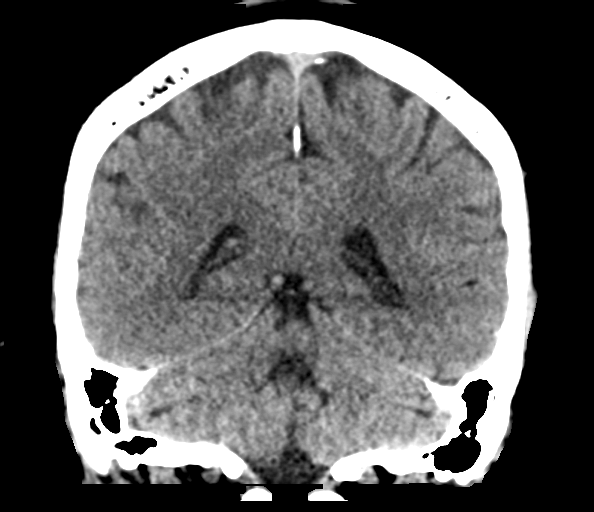
[im 29/65  brain]
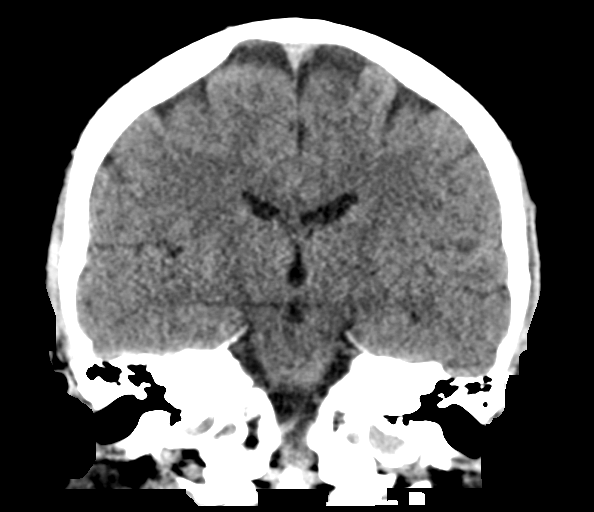
[im 36/65  brain]
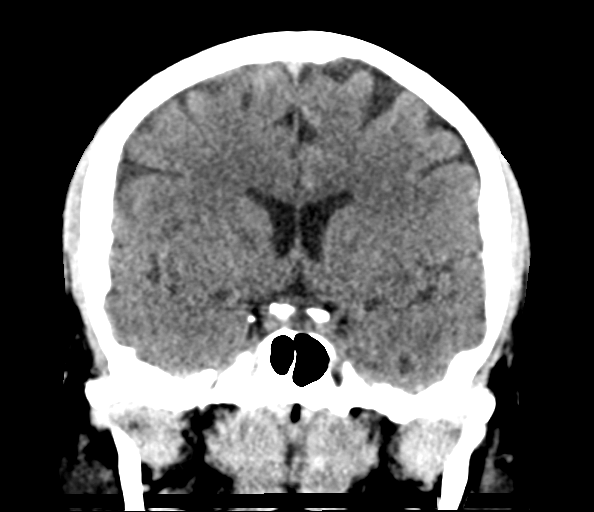

[Series 5: head without sag · sagittal · non-contrast · 0.31mm/px · 3 of 57 slices shown]
[im 19/57  brain]
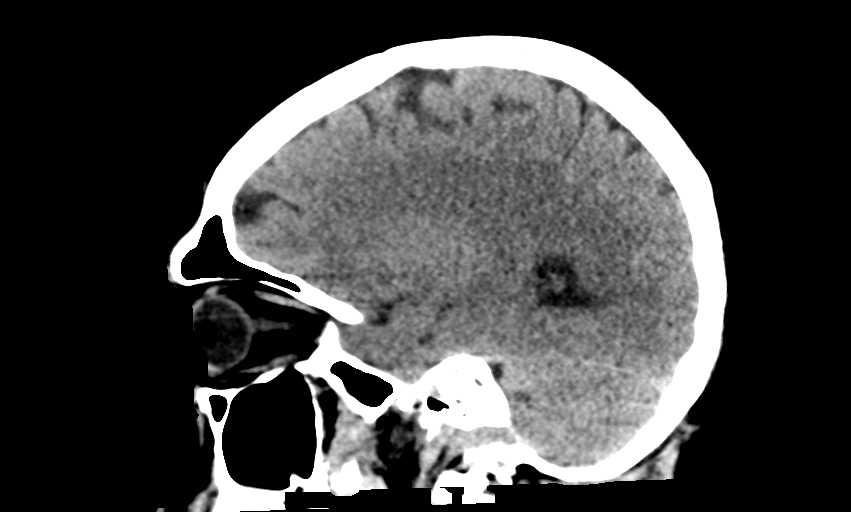
[im 29/57  brain]
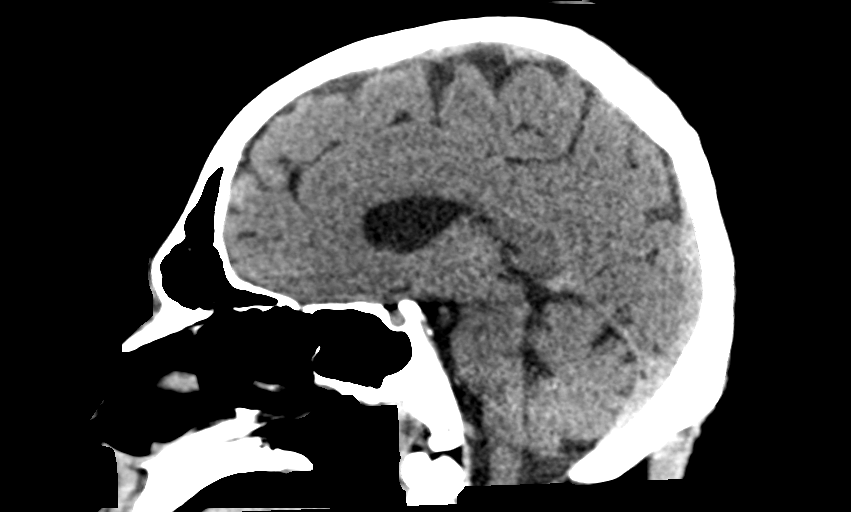
[im 38/57  brain]
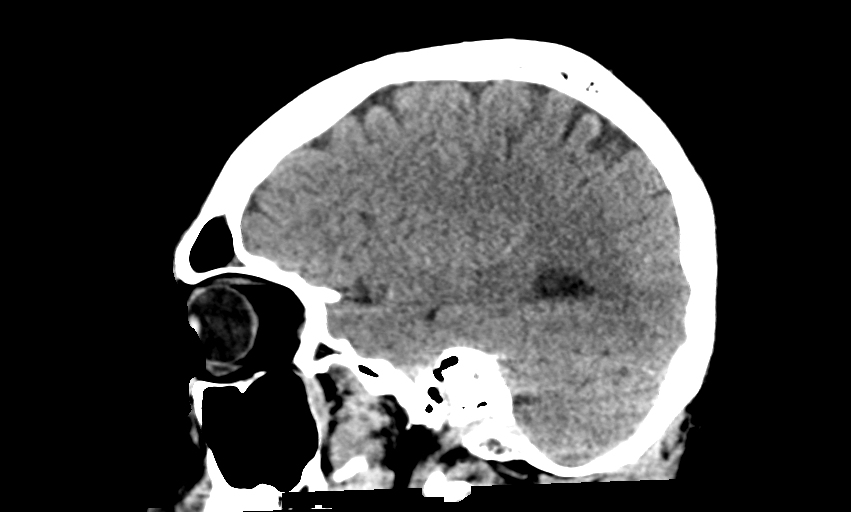

[15 of 47 positions shown; findings below may reference images not displayed]

FINDINGS: Brain: Normal ventricular morphology. No midline shift or mass
effect. Normal appearance of brain parenchyma. No intracranial
hemorrhage, mass lesion, or evidence acute infarction. No
extra-axial fluid collections.

Vascular: Unremarkable

Skull: Intact.  Incomplete posterior arch C1.

Sinuses/Orbits: Chronic opacity at the far RIGHT lateral aspect of
the frontal sinus unchanged. Mucosal thickening BILATERAL maxillary
sinuses, mild.

Other: N/A
IMPRESSION: No acute intracranial abnormalities.

## 2019-09-15 ENCOUNTER — Telehealth: Payer: Self-pay | Admitting: General Practice

## 2019-09-15 NOTE — Telephone Encounter (Signed)
Received request for appt with [facility]. Called to give pt phone number for [facility] so he can call to get scheduled. No answer and voicemail is full.

## 2021-10-13 ENCOUNTER — Encounter (HOSPITAL_COMMUNITY): Payer: Self-pay | Admitting: Emergency Medicine

## 2021-10-13 ENCOUNTER — Emergency Department (HOSPITAL_COMMUNITY)
Admission: EM | Admit: 2021-10-13 | Discharge: 2021-10-14 | Disposition: A | Discharge status: Other Acute Inpatient Hospital | Payer: BC Managed Care – PPO | Source: Home / Self Care

## 2021-10-13 ENCOUNTER — Other Ambulatory Visit: Payer: Self-pay

## 2021-10-13 DIAGNOSIS — D72829 Elevated white blood cell count, unspecified: Secondary | ICD-10-CM | POA: Insufficient documentation

## 2021-10-13 DIAGNOSIS — S41132A Puncture wound without foreign body of left upper arm, initial encounter: Secondary | ICD-10-CM | POA: Insufficient documentation

## 2021-10-13 DIAGNOSIS — Y900 Blood alcohol level of less than 20 mg/100 ml: Secondary | ICD-10-CM | POA: Insufficient documentation

## 2021-10-13 DIAGNOSIS — Z046 Encounter for general psychiatric examination, requested by authority: Secondary | ICD-10-CM | POA: Insufficient documentation

## 2021-10-13 DIAGNOSIS — R45851 Suicidal ideations: Secondary | ICD-10-CM | POA: Insufficient documentation

## 2021-10-13 DIAGNOSIS — Z20822 Contact with and (suspected) exposure to covid-19: Secondary | ICD-10-CM | POA: Insufficient documentation

## 2021-10-13 DIAGNOSIS — W260XXA Contact with knife, initial encounter: Secondary | ICD-10-CM | POA: Insufficient documentation

## 2021-10-13 DIAGNOSIS — F314 Bipolar disorder, current episode depressed, severe, without psychotic features: Secondary | ICD-10-CM | POA: Insufficient documentation

## 2021-10-13 DIAGNOSIS — F32A Depression, unspecified: Secondary | ICD-10-CM

## 2021-10-13 LAB — CBC WITH DIFFERENTIAL/PLATELET
Abs Immature Granulocytes: 0.13 10*3/uL — ABNORMAL HIGH (ref 0.00–0.07)
Basophils Absolute: 0.1 10*3/uL (ref 0.0–0.1)
Basophils Relative: 1 %
Eosinophils Absolute: 0 10*3/uL (ref 0.0–0.5)
Eosinophils Relative: 0 %
HCT: 49 % (ref 39.0–52.0)
Hemoglobin: 16.5 g/dL (ref 13.0–17.0)
Immature Granulocytes: 1 %
Lymphocytes Relative: 8 %
Lymphs Abs: 1.2 10*3/uL (ref 0.7–4.0)
MCH: 30.1 pg (ref 26.0–34.0)
MCHC: 33.7 g/dL (ref 30.0–36.0)
MCV: 89.4 fL (ref 80.0–100.0)
Monocytes Absolute: 1.1 10*3/uL — ABNORMAL HIGH (ref 0.1–1.0)
Monocytes Relative: 7 %
Neutro Abs: 12.7 10*3/uL — ABNORMAL HIGH (ref 1.7–7.7)
Neutrophils Relative %: 83 %
Platelets: 245 10*3/uL (ref 150–400)
RBC: 5.48 MIL/uL (ref 4.22–5.81)
RDW: 12.6 % (ref 11.5–15.5)
WBC: 15.3 10*3/uL — ABNORMAL HIGH (ref 4.0–10.5)
nRBC: 0 % (ref 0.0–0.2)

## 2021-10-13 LAB — COMPREHENSIVE METABOLIC PANEL
ALT: 18 U/L (ref 0–44)
AST: 20 U/L (ref 15–41)
Albumin: 4.7 g/dL (ref 3.5–5.0)
Alkaline Phosphatase: 79 U/L (ref 38–126)
Anion gap: 8 (ref 5–15)
BUN: 13 mg/dL (ref 6–20)
CO2: 26 mmol/L (ref 22–32)
Calcium: 9.5 mg/dL (ref 8.9–10.3)
Chloride: 108 mmol/L (ref 98–111)
Creatinine, Ser: 1.08 mg/dL (ref 0.61–1.24)
GFR, Estimated: >60 mL/min (ref >60)
Glucose, Bld: 117 mg/dL — ABNORMAL HIGH (ref 70–99)
Potassium: 4.5 mmol/L (ref 3.5–5.1)
Sodium: 142 mmol/L (ref 135–145)
Total Bilirubin: 0.6 mg/dL (ref 0.3–1.2)
Total Protein: 7.7 g/dL (ref 6.5–8.1)

## 2021-10-13 LAB — RAPID URINE DRUG SCREEN, HOSP PERFORMED
Amphetamines: NOT DETECTED
Barbiturates: NOT DETECTED
Benzodiazepines: NOT DETECTED
Cocaine: NOT DETECTED
Opiates: NOT DETECTED
Tetrahydrocannabinol: POSITIVE — AB

## 2021-10-13 LAB — ACETAMINOPHEN LEVEL: Acetaminophen (Tylenol), Serum: <10 ug/mL — ABNORMAL LOW (ref 10–30)

## 2021-10-13 LAB — ETHANOL: Alcohol, Ethyl (B): <10 mg/dL (ref <10)

## 2021-10-13 LAB — SALICYLATE LEVEL: Salicylate Lvl: <7 mg/dL — ABNORMAL LOW (ref 7.0–30.0)

## 2021-10-13 NOTE — ED Notes (Signed)
Pt is tearful and withdrawn. Pt head was down, and he did not make eye contact while I was in his room. Pt hair and clothing appear disheveled. Pt left arm is wrapped in a bandage. Pt cooperative with blood work and urine specimen. ?
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
## 2021-10-14 ENCOUNTER — Inpatient Hospital Stay (HOSPITAL_COMMUNITY)
Admission: AD | Admit: 2021-10-14 | Discharge: 2021-10-21 | DRG: 885 | Disposition: A | Discharge status: Home / Self Care | Payer: BC Managed Care – PPO | Source: Intra-hospital | Attending: Psychiatry | Admitting: Psychiatry

## 2021-10-14 ENCOUNTER — Encounter (HOSPITAL_COMMUNITY): Payer: Self-pay | Admitting: Nurse Practitioner

## 2021-10-14 DIAGNOSIS — F17200 Nicotine dependence, unspecified, uncomplicated: Secondary | ICD-10-CM

## 2021-10-14 DIAGNOSIS — F1210 Cannabis abuse, uncomplicated: Secondary | ICD-10-CM | POA: Diagnosis present

## 2021-10-14 DIAGNOSIS — F1020 Alcohol dependence, uncomplicated: Secondary | ICD-10-CM

## 2021-10-14 DIAGNOSIS — Z9151 Personal history of suicidal behavior: Secondary | ICD-10-CM

## 2021-10-14 DIAGNOSIS — F10239 Alcohol dependence with withdrawal, unspecified: Secondary | ICD-10-CM | POA: Diagnosis present

## 2021-10-14 DIAGNOSIS — F314 Bipolar disorder, current episode depressed, severe, without psychotic features: Secondary | ICD-10-CM | POA: Diagnosis present

## 2021-10-14 DIAGNOSIS — Z79899 Other long term (current) drug therapy: Secondary | ICD-10-CM | POA: Diagnosis not present

## 2021-10-14 DIAGNOSIS — Z20822 Contact with and (suspected) exposure to covid-19: Secondary | ICD-10-CM | POA: Diagnosis present

## 2021-10-14 DIAGNOSIS — Z87891 Personal history of nicotine dependence: Secondary | ICD-10-CM

## 2021-10-14 DIAGNOSIS — F411 Generalized anxiety disorder: Secondary | ICD-10-CM

## 2021-10-14 DIAGNOSIS — I10 Essential (primary) hypertension: Secondary | ICD-10-CM | POA: Diagnosis present

## 2021-10-14 DIAGNOSIS — R45851 Suicidal ideations: Secondary | ICD-10-CM | POA: Diagnosis present

## 2021-10-14 LAB — RESP PANEL BY RT-PCR (FLU A&B, COVID) ARPGX2
Influenza A by PCR: NEGATIVE
Influenza B by PCR: NEGATIVE
SARS Coronavirus 2 by RT PCR: NEGATIVE

## 2021-10-14 MED ORDER — DIVALPROEX SODIUM ER 500 MG PO TB24
1000.0000 mg | ORAL_TABLET | Freq: Every day | ORAL | Status: DC
  Filled 2021-10-14: qty 2

## 2021-10-14 MED ORDER — ARIPIPRAZOLE 5 MG PO TABS
5.0000 mg | ORAL_TABLET | Freq: Every day | ORAL | Status: DC
  Administered 2021-10-14 – 2021-10-15 (×2): 5 mg via ORAL
  Filled 2021-10-14 (×4): qty 1

## 2021-10-14 MED ORDER — ENSURE ENLIVE PO LIQD
237.0000 mL | Freq: Two times a day (BID) | ORAL | Status: DC
  Administered 2021-10-14 – 2021-10-20 (×7): 237 mL via ORAL
  Filled 2021-10-14 (×18): qty 237

## 2021-10-14 MED ORDER — ONDANSETRON 4 MG PO TBDP: 4.0000 mg | ORAL_TABLET | Freq: Four times a day (QID) | ORAL | Status: DC | PRN

## 2021-10-14 MED ORDER — THIAMINE HCL 100 MG PO TABS
100.0000 mg | ORAL_TABLET | Freq: Every day | ORAL | Status: DC
  Filled 2021-10-14: qty 1

## 2021-10-14 MED ORDER — TOPIRAMATE 25 MG PO TABS
25.0000 mg | ORAL_TABLET | Freq: Two times a day (BID) | ORAL | Status: DC
Start: 2021-10-14 — End: 2021-10-21
  Administered 2021-10-14 – 2021-10-21 (×14): 25 mg via ORAL
  Filled 2021-10-14 (×2): qty 1
  Filled 2021-10-14: qty 14
  Filled 2021-10-14 (×3): qty 1
  Filled 2021-10-14: qty 14
  Filled 2021-10-14 (×12): qty 1

## 2021-10-14 MED ORDER — LORAZEPAM 1 MG PO TABS
1.0000 mg | ORAL_TABLET | Freq: Three times a day (TID) | ORAL | Status: AC
  Administered 2021-10-16 (×3): 1 mg via ORAL
  Filled 2021-10-14 (×3): qty 1

## 2021-10-14 MED ORDER — ALUM & MAG HYDROXIDE-SIMETH 200-200-20 MG/5ML PO SUSP
30.0000 mL | ORAL | Status: DC | PRN
  Administered 2021-10-17: 30 mL via ORAL
  Filled 2021-10-14: qty 30

## 2021-10-14 MED ORDER — ONDANSETRON 4 MG PO TBDP: 4.0000 mg | ORAL_TABLET | Freq: Four times a day (QID) | ORAL | Status: AC | PRN

## 2021-10-14 MED ORDER — FLUOXETINE HCL 20 MG PO CAPS
20.0000 mg | ORAL_CAPSULE | Freq: Every day | ORAL | Status: DC
  Filled 2021-10-14: qty 1

## 2021-10-14 MED ORDER — ACETAMINOPHEN 325 MG PO TABS: 650.0000 mg | ORAL_TABLET | Freq: Four times a day (QID) | ORAL | Status: DC | PRN

## 2021-10-14 MED ORDER — LOPERAMIDE HCL 2 MG PO CAPS: 2.0000 mg | ORAL_CAPSULE | ORAL | Status: AC | PRN

## 2021-10-14 MED ORDER — LORAZEPAM 1 MG PO TABS
1.0000 mg | ORAL_TABLET | Freq: Two times a day (BID) | ORAL | Status: AC
  Administered 2021-10-17 (×2): 1 mg via ORAL
  Filled 2021-10-14 (×2): qty 1

## 2021-10-14 MED ORDER — MAGNESIUM HYDROXIDE 400 MG/5ML PO SUSP
30.0000 mL | Freq: Every day | ORAL | Status: DC | PRN
  Administered 2021-10-19: 30 mL via ORAL
  Filled 2021-10-14: qty 30

## 2021-10-14 MED ORDER — FLUOXETINE HCL 20 MG PO CAPS
20.0000 mg | ORAL_CAPSULE | Freq: Every day | ORAL | Status: DC
  Administered 2021-10-14 – 2021-10-21 (×8): 20 mg via ORAL
  Filled 2021-10-14 (×8): qty 1
  Filled 2021-10-14: qty 7
  Filled 2021-10-14: qty 1

## 2021-10-14 MED ORDER — THIAMINE HCL 100 MG PO TABS
100.0000 mg | ORAL_TABLET | Freq: Every day | ORAL | Status: DC
  Administered 2021-10-15 – 2021-10-21 (×7): 100 mg via ORAL
  Filled 2021-10-14 (×9): qty 1

## 2021-10-14 MED ORDER — LISINOPRIL 10 MG PO TABS
10.0000 mg | ORAL_TABLET | Freq: Every day | ORAL | Status: DC
  Administered 2021-10-15 – 2021-10-21 (×7): 10 mg via ORAL
  Filled 2021-10-14 (×9): qty 2
  Filled 2021-10-14: qty 7

## 2021-10-14 MED ORDER — THIAMINE HCL 100 MG/ML IJ SOLN
100.0000 mg | Freq: Once | INTRAMUSCULAR | Status: DC
  Filled 2021-10-14: qty 2

## 2021-10-14 MED ORDER — LOPERAMIDE HCL 2 MG PO CAPS: 2.0000 mg | ORAL_CAPSULE | ORAL | Status: DC | PRN

## 2021-10-14 MED ORDER — ADULT MULTIVITAMIN W/MINERALS CH
1.0000 | ORAL_TABLET | Freq: Every day | ORAL | Status: DC
  Administered 2021-10-14: 1 via ORAL
  Filled 2021-10-14 (×3): qty 1

## 2021-10-14 MED ORDER — CLONIDINE HCL 0.1 MG PO TABS
0.1000 mg | ORAL_TABLET | Freq: Once | ORAL | Status: AC
  Administered 2021-10-14: 0.1 mg via ORAL
  Filled 2021-10-14 (×2): qty 1

## 2021-10-14 MED ORDER — LORAZEPAM 1 MG PO TABS: 1.0000 mg | ORAL_TABLET | Freq: Four times a day (QID) | ORAL | Status: AC | PRN

## 2021-10-14 MED ORDER — TRAZODONE HCL 50 MG PO TABS
50.0000 mg | ORAL_TABLET | Freq: Every evening | ORAL | Status: DC | PRN
  Administered 2021-10-16 – 2021-10-20 (×4): 50 mg via ORAL
  Filled 2021-10-14 (×4): qty 1
  Filled 2021-10-14: qty 7
  Filled 2021-10-14: qty 1

## 2021-10-14 MED ORDER — LORAZEPAM 1 MG PO TABS
1.0000 mg | ORAL_TABLET | Freq: Every day | ORAL | Status: DC
  Filled 2021-10-14: qty 1

## 2021-10-14 MED ORDER — LISINOPRIL 10 MG PO TABS: 10.0000 mg | ORAL_TABLET | Freq: Every day | ORAL | Status: DC

## 2021-10-14 MED ORDER — HYDROXYZINE HCL 25 MG PO TABS
25.0000 mg | ORAL_TABLET | Freq: Four times a day (QID) | ORAL | Status: AC | PRN
  Administered 2021-10-15: 25 mg via ORAL
  Filled 2021-10-14: qty 1

## 2021-10-14 MED ORDER — LORAZEPAM 1 MG PO TABS
1.0000 mg | ORAL_TABLET | Freq: Four times a day (QID) | ORAL | Status: AC
  Administered 2021-10-14 – 2021-10-15 (×6): 1 mg via ORAL
  Filled 2021-10-14 (×5): qty 1

## 2021-10-14 MED ORDER — LORAZEPAM 1 MG PO TABS
1.0000 mg | ORAL_TABLET | Freq: Four times a day (QID) | ORAL | Status: DC | PRN
  Administered 2021-10-14: 1 mg via ORAL
  Filled 2021-10-14: qty 1

## 2021-10-14 MED ORDER — CLONIDINE HCL 0.1 MG PO TABS
0.1000 mg | ORAL_TABLET | Freq: Two times a day (BID) | ORAL | Status: DC | PRN
  Administered 2021-10-15: 0.1 mg via ORAL

## 2021-10-14 MED ORDER — HYDROXYZINE HCL 25 MG PO TABS
25.0000 mg | ORAL_TABLET | Freq: Three times a day (TID) | ORAL | Status: DC | PRN
Start: 2021-10-14 — End: 2021-10-14

## 2021-10-14 MED ORDER — ADULT MULTIVITAMIN W/MINERALS CH
1.0000 | ORAL_TABLET | Freq: Every day | ORAL | Status: DC
  Administered 2021-10-15 – 2021-10-21 (×7): 1 via ORAL
  Filled 2021-10-14 (×10): qty 1

## 2021-10-14 NOTE — Progress Notes (Signed)
Patient endorses Passive SI with no plan and contracts for safety denies HI/A/VH, Depression of 6/10, and anxiety 5/10.  ? ?Support and encouragement provided. Q 15 minutes safety checks ongoing Patient remains safe.  ?

## 2021-10-14 NOTE — ED Notes (Signed)
Patient DC off unit to [facility] per provider. Patient alert and no s/s if distress.  DC information and belongings given to  patient Patient ambulatory off unit  Patient escorted and transported by [employer]. ?
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
## 2021-10-14 NOTE — Tx Team (Signed)
Initial Treatment Plan ?10/14/2021 ?1:36 PM ?[name] ?HRC:[id number] ? ? ? ?PATIENT STRESSORS: ?Financial difficulties   ?Marital or family conflict   ?Occupational concerns   ?Substance abuse   ? ? ?PATIENT STRENGTHS: ?Capable of independent living  ?Financial means  ?Work skills  ? ? ?PATIENT IDENTIFIED PROBLEMS: ?Family conflict  ?Substance abuse   ?Depression  ?SI thoughts   ?Anxiety   ?  ?  ?  ?  ?  ? ?DISCHARGE CRITERIA:  ?Improved stabilization in mood, thinking, and/or behavior ?Motivation to continue treatment in a less acute level of care ?Verbal commitment to aftercare and medication compliance ?Withdrawal symptoms are absent or subacute and managed without 24-hour nursing intervention ? ?PRELIMINARY DISCHARGE PLAN: ?Attend PHP/IOP ?Return to previous living arrangement ?Return to previous work or school arrangements ? ?PATIENT/FAMILY INVOLVEMENT: ?This treatment plan has been presented to and reviewed with the patient, Shawn Schaefer.  The patient has been given the opportunity to ask questions and make suggestions. ? ?Sofie Hartigan, RN ?10/14/2021, 1:36 PM ?
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
## 2021-10-14 NOTE — ED Provider Notes (Signed)
Emergency Medicine Observation Re-evaluation Note ? ?Shawn Schaefer is a 41 y.o. male, seen on rounds today.  Pt initially presented to the ED for complaints of Suicidal ?Currently, the patient is awaiting TTS evaluation. ? ?Physical Exam  ?BP (!) 148/83   Pulse (!) 57   Temp 97.7 ?F (36.5 ?C)   Resp 16   Ht 5\' 10"  (1.778 m)   Wt 77.1 kg   SpO2 99%   BMI 24.39 kg/m?  ?Physical Exam ?General: Calm and cooperative  ?Cardiac: Well perfused.  ?Lungs: Even, unlabored respirations  ?Psych: Calm ? ?ED Course / MDM  ?EKG:  ? ?I have reviewed the labs performed to date as well as medications administered while in observation.  Recent changes in the last 24 hours include patient accepted to [facility], [facility]. [facility] Schaefer. ? ?Plan  ?Current plan is for [facility] admit. ?Shawn Schaefer is under involuntary commitment. ?  ? ?  ?Shawn Buckler, MD ?10/14/21 1010 ? ?

## 2021-10-14 NOTE — Progress Notes (Signed)
Patient refused Thiamine B1 IM injection.  ?
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
## 2021-10-14 NOTE — Progress Notes (Signed)
Admission note: Pt presented to the ED by [employer] under IVC due to SI and HI thoughts. Pt was very minimal with Clinical research associate and only stated that he was having SI because his wife was divorcing him. As well as his kids (7 and 9) not wanting to see him anymore. Pt stated other stressors were money, job, and relationships. 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 Pt seems to be very depressed and stated that he has on and off SI but contracts for safety. Pt stated that he will binge drink occasionally but drank everyday this past weekend. Pt had a high BP and low pulse rate on admission. MD was notified once on the unit and PRNs were given. Pt scored a 10 on CIWA. V/S re-checked and BP had improved. Pt's wife, whom is separated from him now, has a left a long note of her interaction with him. The note is on his chart. He threatened to kill her, his kids, and himself. Sent her many texts with these threats and threaten to showed up with weapons. Pt did not mention any of this. Consents signed, handbook detailing the patient's rights, responsibilities, and visitor guidelines provided. Skin/belongings search completed and patient oriented to unit. Patient stable at this time. Patient given the opportunity to express concerns and ask questions. Patient given toiletries. Will continue to monitor.   ? 10/14/21 1115  ?Psych Admission Type (Psych Patients Only)  ?Admission Status Involuntary  ?Psychosocial Assessment  ?Patient Complaints Agitation;Anger;Anxiety;Appetite decrease;Decreased concentration;Depression;Hopelessness;Loneliness;Restlessness;Sadness;Worrying;Shakiness  ?Eye Contact Avoids  ?Facial Expression Sad;Sullen;Flat  ?Affect Anxious;Depressed;Sad;Sullen  ?Speech Soft;Logical/coherent  ?Interaction Forwards little;Guarded;Minimal  ?Motor Activity Tremors  ?Appearance/Hygiene In scrubs  ?Behavior Characteristics Cooperative;Anxious;Guarded  ?Mood Depressed;Anxious;Empty;Sad;Sullen  ?Thought Process  ?Coherency WDL   ?Content Blaming self;Blaming others  ?Delusions None reported or observed  ?Perception WDL  ?Hallucination None reported or observed  ?Judgment Poor  ?Confusion None  ?Danger to Self  ?Current suicidal ideation? Passive  ?Agreement Not to Harm Self Yes  ?Description of Agreement Verbal  ?Danger to Others  ?Danger to Others None reported or observed  ? ? ?

## 2021-10-14 NOTE — Group Note (Signed)
Recreation Therapy Group Note ? ? ?Group Topic:Animal Assisted Therapy   ?Group Date: 10/14/2021 ?Start Time: 1430 ?End Time: 1515 ?Facilitators: Caroll Rancher, LRT,CTRS ?Location: [address] ? ? ?Animal-Assisted Activity (AAA) Program Checklist/Progress Note ?Patient Eligibility Criteria Checklist & Daily Group note for Rec Tx Intervention ? ?AAA/T Program Assumption of Risk Form signed by Patient/ or Parent Legal Guardian YES ? ?Patient understands their participation is voluntary YES ? ?Group Description: Patients provided opportunity to interact with trained and credentialed [employer] Therapy dog and the community volunteer/dog handler. Patients practiced appropriate animal interaction and were educated on dog safety outside of the hospital in common community settings. Patients were allowed to use dog toys and other items to practice commands, engage the dog in play, and/or complete routine aspects of animal care.  ? ?Education: Charity fundraiser, Health visitor, Communication & Social Skills  ? ? ?Affect/Mood: N/A ?  ?Participation Level: Did not attend ?  ? ?Clinical Observations/Individualized Feedback:   ? ? ?Plan: Continue to engage patient in RT group sessions 2-3x/week. ? ? ?Caroll Rancher, LRT,CTRS ?10/14/2021 4:28 PM ?

## 2021-10-14 NOTE — BH Assessment (Signed)
BHH Assessment Progress Note ?  ?Per Nira Conn, NP, this pt requires psychiatric hospitalization.  Danika, RN, [facility] has assigned pt to [facility] Rm 405-1 to the service of Dr Sherron Flemings.  Pt presents under IVC initiated by law enforcement, and upheld by EDP Benjiman Core, MD, and IVC documents have been faxed to [facility].  EDP Alona Bene, MD and pt's nurse, Waynetta Sandy, have been notified, and Waynetta Sandy agrees to call report to [phone].  Pt is to be transported via Patent examiner. ? ? ?Doylene Canning, MA ?Behavioral Health Coordinator ?[phone] ? ?

## 2021-10-15 ENCOUNTER — Encounter (HOSPITAL_COMMUNITY): Payer: Self-pay

## 2021-10-15 NOTE — BHH Group Notes (Signed)
Patient did not attend the NA group. 

## 2021-10-15 NOTE — Progress Notes (Signed)
Patient did not attend wrap up group. 

## 2021-10-15 NOTE — Group Note (Signed)
LCSW Group Therapy Note ? ? ?Group Date: 10/15/2021 ?Start Time: 1300 ?End Time: 1400 ? ?Type of Therapy and Topic:  Group Therapy:  Strengths Exploration ? ? ?Participation Level: Did Not Attend ? ?Description of Group: ?This group allows individuals to explore their strengths, learn to use strengths in new ways to improve well-being. Strengths-based interventions involve identifying strengths, understanding how they are used, and learning new ways to apply them. Individuals will identify their strengths, and then explore their roles in different areas of life (relationships, professional life, and personal fulfillment). Individuals will think about ways in which they currently use their strengths, along with new ways they could begin using them. ? ?  ?Therapeutic Goals ?Patient will verbalize two of their strengths ?Patient will identify how their strengths are currently used ?Patient will identify two new ways to apply their strengths  ?Patients will create a plan to apply their strengths in their daily lives  ?  ? ?Summary of Patient Progress:  Did not attend  ? ?Aram Beecham, LCSWA ?10/15/2021  2:14 PM   ? ?
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
## 2021-10-15 NOTE — Progress Notes (Signed)
?   10/15/21 0825  ?Psych Admission Type (Psych Patients Only)  ?Admission Status Involuntary  ?Psychosocial Assessment  ?Patient Complaints Anxiety;Depression  ?Eye Contact Brief  ?Facial Expression Flat;Sad  ?Affect Anxious;Depressed;Sad  ?Speech Logical/coherent  ?Interaction Guarded  ?Motor Activity Fidgety  ?Appearance/Hygiene In scrubs  ?Behavior Characteristics Anxious;Guarded  ?Mood Depressed;Anxious  ?Thought Process  ?Coherency WDL  ?Content Blaming others;Blaming self  ?Delusions None reported or observed  ?Perception WDL  ?Hallucination None reported or observed  ?Judgment Poor  ?Confusion None  ?Danger to Self  ?Current suicidal ideation? Denies  ?Agreement Not to Harm Self Yes  ?Description of Agreement Verbal  ?Danger to Others  ?Danger to Others None reported or observed  ? ? ?

## 2021-10-15 NOTE — Progress Notes (Signed)
?   10/15/21 0500  ?Psych Admission Type (Psych Patients Only)  ?Admission Status Involuntary  ?Psychosocial Assessment  ?Patient Complaints Anxiety;Depression  ?Eye Contact Brief  ?Facial Expression Sullen;Sad;Flat  ?Affect Anxious;Depressed;Sad  ?Speech Logical/coherent  ?Interaction Forwards little;Guarded  ?Motor Activity Tremors  ?Appearance/Hygiene In scrubs  ?Behavior Characteristics Anxious;Guarded  ?Mood Depressed;Anxious  ?Thought Process  ?Coherency WDL  ?Content Blaming others;Blaming self  ?Delusions None reported or observed  ?Perception WDL  ?Hallucination None reported or observed  ?Judgment Poor  ?Confusion None  ?Danger to Self  ?Current suicidal ideation? Passive  ?Agreement Not to Harm Self Yes  ?Description of Agreement verbal  ? ? ?

## 2021-10-15 NOTE — BH IP Treatment Plan (Signed)
Interdisciplinary Treatment and Diagnostic Plan Update ? ?10/15/2021 ?Time of Session: 9:35am  ?Shawn Schaefer ?MRN: [id number] ? ?Principal Diagnosis: Bipolar 1 disorder, depressed, severe ([place]) ? ?Secondary Diagnoses: Principal Problem: ?  Bipolar 1 disorder, depressed, severe ([place]) ?Active Problems: ?  GAD (generalized anxiety disorder) ?  Alcohol use disorder, severe, dependence ([place]) ?  Tobacco use disorder ? ? ?Current Medications:  ?Current Facility-Administered Medications  ?Medication Dose Route Frequency Provider Last Rate Last Admin  ? acetaminophen (TYLENOL) tablet 650 mg  650 mg Oral Q6H PRN Rozetta Nunnery, NP      ? alum & mag hydroxide-simeth (MAALOX/MYLANTA) 200-200-20 MG/5ML suspension 30 mL  30 mL Oral Q4H PRN Rozetta Nunnery, NP      ? ARIPiprazole (ABILIFY) tablet 5 mg  5 mg Oral QHS Massengill, Ovid Curd, MD   5 mg at 10/14/21 2111  ? cloNIDine (CATAPRES) tablet 0.1 mg  0.1 mg Oral BID PRN Janine Limbo, MD   0.1 mg at 10/15/21 1916  ? feeding supplement (ENSURE ENLIVE / ENSURE PLUS) liquid 237 mL  237 mL Oral BID BM Massengill, Ovid Curd, MD   237 mL at 10/15/21 1154  ? FLUoxetine (PROZAC) capsule 20 mg  20 mg Oral Daily Massengill, Nathan, MD   20 mg at 10/15/21 6060  ? hydrOXYzine (ATARAX) tablet 25 mg  25 mg Oral Q6H PRN Janine Limbo, MD   25 mg at 10/15/21 0459  ? lisinopril (ZESTRIL) tablet 10 mg  10 mg Oral Daily Massengill, Ovid Curd, MD   10 mg at 10/15/21 9774  ? loperamide (IMODIUM) capsule 2-4 mg  2-4 mg Oral PRN Massengill, Ovid Curd, MD      ? LORazepam (ATIVAN) tablet 1 mg  1 mg Oral Q6H PRN Massengill, Nathan, MD      ? LORazepam (ATIVAN) tablet 1 mg  1 mg Oral QID Massengill, Nathan, MD   1 mg at 10/15/21 1153  ? Followed by  ? Derrill Memo ON 10/16/2021] LORazepam (ATIVAN) tablet 1 mg  1 mg Oral TID Janine Limbo, MD      ? Followed by  ? Derrill Memo ON 10/17/2021] LORazepam (ATIVAN) tablet 1 mg  1 mg Oral BID Massengill, Nathan, MD      ? Followed by  ? Derrill Memo ON 10/18/2021] LORazepam  (ATIVAN) tablet 1 mg  1 mg Oral Daily Massengill, Nathan, MD      ? magnesium hydroxide (MILK OF MAGNESIA) suspension 30 mL  30 mL Oral Daily PRN Rozetta Nunnery, NP      ? multivitamin with minerals tablet 1 tablet  1 tablet Oral Daily Massengill, Ovid Curd, MD   1 tablet at 10/15/21 1423  ? ondansetron (ZOFRAN-ODT) disintegrating tablet 4 mg  4 mg Oral Q6H PRN Massengill, Ovid Curd, MD      ? thiamine (B-1) injection 100 mg  100 mg Intramuscular Once Massengill, Ovid Curd, MD      ? thiamine tablet 100 mg  100 mg Oral Daily Massengill, Nathan, MD   100 mg at 10/15/21 0830  ? topiramate (TOPAMAX) tablet 25 mg  25 mg Oral Q12H Massengill, Ovid Curd, MD   25 mg at 10/15/21 9532  ? traZODone (DESYREL) tablet 50 mg  50 mg Oral QHS PRN Janine Limbo, MD      ? ?PTA Medications: ?Medications Prior to Admission  ?Medication Sig Dispense Refill Last Dose  ? divalproex (DEPAKOTE ER) 500 MG 24 hr tablet Take 1,000 mg by mouth at bedtime.     ? FLUoxetine (PROZAC) 20 MG capsule Take 20  mg by mouth at bedtime.     ? ? ?Patient Stressors: Financial difficulties   ?Marital or family conflict   ?Occupational concerns   ?Substance abuse   ? ?Patient Strengths: Capable of independent living  ?Financial means  ?Work skills  ? ?Treatment Modalities: Medication Management, Group therapy, Case management,  ?1 to 1 session with clinician, Psychoeducation, Recreational therapy. ? ? ?Physician Treatment Plan for Primary Diagnosis: Bipolar 1 disorder, depressed, severe ([place]) ?Long Term Goal(s): Improvement in symptoms so as ready for discharge  ? ?Short Term Goals: Ability to identify changes in lifestyle to reduce recurrence of condition will improve ?Ability to verbalize feelings will improve ?Ability to disclose and discuss suicidal ideas ?Ability to demonstrate self-control will improve ?Ability to identify and develop effective coping behaviors will improve ?Ability to maintain clinical measurements within normal limits will  improve ?Compliance with prescribed medications will improve ?Ability to identify triggers associated with substance abuse/mental health issues will improve ? ?Medication Management: Evaluate patient's response, side effects, and tolerance of medication regimen. ? ?Therapeutic Interventions: 1 to 1 sessions, Unit Group sessions and Medication administration. ? ?Evaluation of Outcomes: Not Met ? ?Physician Treatment Plan for Secondary Diagnosis: Principal Problem: ?  Bipolar 1 disorder, depressed, severe ([place]) ?Active Problems: ?  GAD (generalized anxiety disorder) ?  Alcohol use disorder, severe, dependence ([place]) ?  Tobacco use disorder ? ?Long Term Goal(s): Improvement in symptoms so as ready for discharge  ? ?Short Term Goals: Ability to identify changes in lifestyle to reduce recurrence of condition will improve ?Ability to verbalize feelings will improve ?Ability to disclose and discuss suicidal ideas ?Ability to demonstrate self-control will improve ?Ability to identify and develop effective coping behaviors will improve ?Ability to maintain clinical measurements within normal limits will improve ?Compliance with prescribed medications will improve ?Ability to identify triggers associated with substance abuse/mental health issues will improve    ? ?Medication Management: Evaluate patient's response, side effects, and tolerance of medication regimen. ? ?Therapeutic Interventions: 1 to 1 sessions, Unit Group sessions and Medication administration. ? ?Evaluation of Outcomes: Not Met ? ? ?RN Treatment Plan for Primary Diagnosis: Bipolar 1 disorder, depressed, severe ([place]) ?Long Term Goal(s): Knowledge of disease and therapeutic regimen to maintain health will improve ? ?Short Term Goals: Ability to remain free from injury will improve, Ability to participate in decision making will improve, Ability to verbalize feelings will improve, Ability to disclose and discuss suicidal ideas, and Ability to identify and  develop effective coping behaviors will improve ? ?Medication Management: RN will administer medications as ordered by provider, will assess and evaluate patient's response and provide education to patient for prescribed medication. RN will report any adverse and/or side effects to prescribing provider. ? ?Therapeutic Interventions: 1 on 1 counseling sessions, Psychoeducation, Medication administration, Evaluate responses to treatment, Monitor vital signs and CBGs as ordered, Perform/monitor CIWA, COWS, AIMS and Fall Risk screenings as ordered, Perform wound care treatments as ordered. ? ?Evaluation of Outcomes: Not Met ? ? ?LCSW Treatment Plan for Primary Diagnosis: Bipolar 1 disorder, depressed, severe ([place]) ?Long Term Goal(s): Safe transition to appropriate next level of care at discharge, Engage patient in therapeutic group addressing interpersonal concerns. ? ?Short Term Goals: Engage patient in aftercare planning with referrals and resources, Increase social support, Increase emotional regulation, Facilitate acceptance of mental health diagnosis and concerns, Identify triggers associated with mental health/substance abuse issues, and Increase skills for wellness and recovery ? ?Therapeutic Interventions: Assess for all discharge needs, 1 to 1 time with  Social worker, Explore available resources and support systems, Assess for adequacy in community support network, Educate family and significant other(s) on suicide prevention, Complete Psychosocial Assessment, Interpersonal group therapy. ? ?Evaluation of Outcomes: Not Met ? ? ?Progress in Treatment: ?Attending groups: No. ?Participating in groups: No. ?Taking medication as prescribed: Yes. ?Toleration medication: Yes. ?Family/Significant other contact made: Yes, individual(s) contacted:  Mother  ?Patient understands diagnosis: Yes. ?Discussing patient identified problems/goals with staff: Yes. ?Medical problems stabilized or resolved: Yes. ?Denies  suicidal/homicidal ideation: Yes. ?Issues/concerns per patient self-inventory: No. ? ? ?New problem(s) identified: No, Describe:  None  ? ?New Short Term/Long Term Goal(s): medication stabilization, elimination of SI thoughts, development o

## 2021-10-15 NOTE — Group Note (Signed)
Recreation Therapy Group Note ? ? ?Group Topic:Stress Management  ?Group Date: 10/15/2021 ?Start Time: 27 ?End Time: [id number] ?Facilitators: Victorino Sparrow, LRT,CTRS ?Location: [facility] ? ? ?Goal Area(s) Addresses:  ?Patient will identify positive stress management techniques. ?Patient will identify benefits of using stress management post d/c. ? ?Group Description:  Meditation.  LRT played a meditation that focused on setting intention for the day.  It also focused on making the best use for your time and not focusing on things from the previous day but to focus on what you can in the now.  Patients were to listen and follow along as meditation played to fully engage.  Patients also learned they could find other stress management techniques from Youtube, apps and the Internet in general. ? ? ?Affect/Mood: N/A ?  ?Participation Level: Did not attend ?  ? ?Clinical Observations/Individualized Feedback:   ? ? ?Plan: Continue to engage patient in RT group sessions 2-3x/week. ? ? ?Victorino Sparrow, LRT,CTRS ?10/15/2021 12:25 PM ?

## 2021-10-15 NOTE — [facility] Group Notes (Signed)
Patient did not attend the relaxation group. 

## 2021-10-16 MED ORDER — ARIPIPRAZOLE 10 MG PO TABS
10.0000 mg | ORAL_TABLET | Freq: Every day | ORAL | Status: DC
  Administered 2021-10-16 – 2021-10-20 (×5): 10 mg via ORAL
  Filled 2021-10-16 (×5): qty 1
  Filled 2021-10-16: qty 7
  Filled 2021-10-16: qty 1

## 2021-10-16 NOTE — Progress Notes (Addendum)
[facility] MD Progress Note ? ?10/16/2021 12:35 PM ?[name]  ?MRN:  [id number] ? ?Subjective:  Patient is a 41 year old male with a psychiatric history of bipolar disorder who was admitted to the psychiatric unit for worsening depression and suicidal thoughts. Patient was medically cleared by [facility] hospital.  ? ?The following psychiatric diagnoses were provided on admission: ?-Bipolar disorder, type 1?, current episode depressed ?-GAD with panic attacks ?-alcohol use disorder ?-cannabis use disorder ?-tobacco use disorder ?-h/o cocaine use ?-h/o opiate use  ?-HTN ? ?Yesterday the psychiatry team made the following recommendations: ?-Increase abilify to 7.5 mg qhs - for bipolar disorder ?-Continue fluoxetine 20 mg once daily  - pt reports this is helpful for mood and anxiety  ?-Continue topiramate 25 mg bid for mood stabilization, impulse control, alcohol use disorder  ?-Continue lisinopril 10 mg once daily for HTN ?-Continue clonidine 0.1 mg twice daily as needed for elevated blood pressure ?-Continue CIWA for alcohol withdrawal and scheduled ativan taper ?  ?-offered natrexone for AUD - pt declined. Will offer again tomorrow ? ? ?On assessment today, patient was lying in bed. Pt reports that he was able to get "good sleep," which was better than the night before according to the patient. He reports that is mood is less depressed, but not able to elaborate further. Pt reported speaking to his mother on the phone earlier and indicated that the conversation "went ok." They discussed getting the patient more help, to which the pt was in agreement. Pt was counseled about enrolling into a substance abuse rehab facility. Pt voiced some concerns about continued depression and feeling down.  ?Pt reports that anxiety continues to be elevated.  ?Pt reports a decrease in frequency and intensity of suicidal ideations. Pt denies any intent or plan. Pt reports he "haven't thought much about it." ?Pt denies HI, denies any intent or  thoughts to harm anyone including his wife's new partner. Pt reports the wife and new partner are not actually together.  ?Denies AV, VH, delusions or paranoia.  ?Pt denies any headaches, nausea, diarrhea, vomiting or any other medication related side effects.  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  ? ? ?Principal Problem: Bipolar 1 disorder, depressed, severe ([facility]) ?Diagnosis: Principal Problem: ?  Bipolar 1 disorder, depressed, severe ([facility]) ?Active Problems: ?  GAD (generalized anxiety disorder) ?  Alcohol use disorder, severe, dependence ([facility]) ?  Tobacco use disorder ? ?Total Time spent with patient: 20 minutes ? ?Past Psychiatric History:  ?Patient reports being diagnosed with bipolar in the past.  Patient reports that he is currently taking Prozac and Depakote, last dose 4/21 or 4/22 due to starting drinking. ?Patient reports being psychiatrically hospitalized about 4 times in the past. ?Patient reports 1 suicide attempt in 2013 by hanging. ?Patient reports "I did not respond to the psychiatric medications".  Per chart reviewed these include, Depakote Prozac lithium Remeron and Cymbalta Adderall Abilify Wellbutrin. ? ?Past Medical History:  ?Past Medical History:  ?Diagnosis Date  ? Anxiety   ? Depression   ? Migraines   ? Suicide attempt [facility])   ? History reviewed. No pertinent surgical history. ?Family History: History reviewed. No pertinent family history. ?Family Psychiatric  History: History reviewed. No pertinent family history. ?Social History:  ?Social History  ? ?Substance and Sexual Activity  ?Alcohol Use Yes  ?  Alcohol/week: 12.0 standard drinks  ? Types: 12 Cans of beer per week  ? Comment: binge drinking on weekends  ?   ?Social History  ? ?Substance and Sexual Activity  ?Drug Use Yes  ? Frequency: 1.0 times per week  ? Types: Marijuana   ?  ?Social History  ? ?Socioeconomic History  ? Marital status: Married  ?  Spouse name: Not on file  ? Number of children: Not on file  ? Years of education: Not on file  ? Highest education level: Not on file  ?Occupational History  ? Not on file  ?Tobacco Use  ? Smoking status: Former  ?  Types: Cigarettes  ? Smokeless tobacco: Current  ?  Types: Chew  ?Substance and Sexual Activity  ? Alcohol use: Yes  ?  Alcohol/week: 12.0 standard drinks  ?  Types: 12 Cans of beer per week  ?  Comment: binge drinking on weekends  ? Drug use: Yes  ?  Frequency: 1.0 times per week  ?  Types: Marijuana  ? Sexual activity: Not Currently  ?  Birth control/protection: None  ?Other Topics Concern  ? Not on file  ?Social History Narrative  ? Not on file  ? ?Social Determinants of Health  ? ?Financial Resource Strain: Not on file  ?Food Insecurity: Not on file  ?Transportation Needs: Not on file  ?Physical Activity: Not on file  ?Stress: Not on file  ?Social Connections: Not on file  ? ?Additional Social History:  ?  ?Sleep: Good ? ?Appetite:  Fair ? ?Current Medications: ?Current Facility-Administered Medications  ?Medication Dose Route Frequency Provider Last Rate Last Admin  ? acetaminophen (TYLENOL) tablet 650 mg  650 mg Oral Q6H PRN Jackelyn Poling, NP      ? alum & mag hydroxide-simeth (MAALOX/MYLANTA) 200-200-20 MG/5ML suspension 30 mL  30 mL Oral Q4H PRN Jackelyn Poling, NP      ? ARIPiprazole (ABILIFY) tablet 10 mg  10 mg Oral QHS Amandeep Nesmith, MD      ? cloNIDine (CATAPRES) tablet 0.1 mg  0.1 mg Oral BID PRN Phineas Inches, MD   0.1 mg at 10/15/21 9476  ? feeding supplement (ENSURE ENLIVE / ENSURE PLUS) liquid 237 mL  237 mL Oral BID BM Anitta Tenny, MD   237 mL at 10/15/21 1640  ? FLUoxetine (PROZAC) capsule 20 mg  20 mg Oral Daily Chanse Kagel, MD   20 mg at 10/16/21 0753  ? hydrOXYzine (ATARAX) tablet 25 mg  25 mg Oral Q6H PRN Phineas Inches, MD   25 mg at 10/15/21 5465  ? lisinopril (ZESTRIL)  tablet 10 mg  10 mg Oral Daily Nic Lampe, Harrold Donath, MD   10 mg at 10/16/21 0753  ? loperamide (IMODIUM) capsule 2-4 mg  2-4 mg Oral PRN Kizer Nobbe, Harrold Donath, MD      ? LORazepam (ATIVAN) tablet 1 mg  1 mg Oral Q6H PRN Amybeth Sieg, MD      ? LORazepam (ATIVAN) tablet 1 mg  1 mg Oral TID Phineas Inches, MD   1 mg at 10/16/21 1205  ? Followed by  ? Melene Muller ON 10/17/2021] LORazepam (ATIVAN) tablet 1 mg  1 mg Oral BID Lashawnta Burgert, MD      ? Followed by  ? Melene Muller ON 10/18/2021] LORazepam (ATIVAN) tablet 1 mg  1 mg Oral Daily Neftali Thurow, MD      ? magnesium hydroxide (MILK OF MAGNESIA) suspension 30 mL  30 mL Oral Daily PRN Jackelyn Poling, NP      ?  multivitamin with minerals tablet 1 tablet  1 tablet Oral Daily Cheynne Virden, Harrold DonathNathan, MD   1 tablet at 10/16/21 0753  ? ondansetron (ZOFRAN-ODT) disintegrating tablet 4 mg  4 mg Oral Q6H PRN Miyah Hampshire, Harrold DonathNathan, MD      ? thiamine (B-1) injection 100 mg  100 mg Intramuscular Once Auden Wettstein, Harrold DonathNathan, MD      ? thiamine tablet 100 mg  100 mg Oral Daily Deitrick Ferreri, Harrold DonathNathan, MD   100 mg at 10/16/21 0753  ? topiramate (TOPAMAX) tablet 25 mg  25 mg Oral Q12H Gayl Ivanoff, Harrold DonathNathan, MD   25 mg at 10/16/21 0753  ? traZODone (DESYREL) tablet 50 mg  50 mg Oral QHS PRN Kellyjo Edgren, MD      ? ? ?Lab Results: No results found for this or any previous visit (from the past 48 hour(s)). ? ?Blood Alcohol level:  ?Lab Results  ?Component Value Date  ? ETH <10 10/13/2021  ? ETH <5 02/27/2015  ? ? ?Metabolic Disorder Labs: ?No results found for: HGBA1C, MPG ?No results found for: PROLACTIN ?No results found for: CHOL, TRIG, HDL, CHOLHDL, VLDL, LDLCALC ? ?Physical Findings: ?AIMS: Facial and Oral Movements ?Muscles of Facial Expression: None, normal ?Lips and Perioral Area: None, normal ?Jaw: None, normal ?Tongue: None, normal,Extremity Movements ?Upper (arms, wrists, hands, fingers): None, normal ?Lower (legs, knees, ankles, toes): None, normal, Trunk Movements ?Neck, shoulders,  hips: None, normal, Overall Severity ?Severity of abnormal movements (highest score from questions above): None, normal ?Incapacitation due to abnormal movements: None, normal ?Patient's awareness of abnormal

## 2021-10-16 NOTE — [facility] Suicide Risk Assessment (Signed)
[facility] INPATIENT:  Family/Significant Other Suicide Prevention Education ? ?Suicide Prevention Education:  ?Education Completed; Greig Castilla [phone] (Mother) has been identified by the patient as the family member/significant other with whom the patient will be residing, and identified as the person(s) who will aid the patient in the event of a mental health crisis (suicidal ideations/suicide attempt).  With written consent from the patient, the family member/significant other has been provided the following suicide prevention education, prior to the and/or following the discharge of the patient. ? ?The suicide prevention education provided includes the following: ?Suicide risk factors ?Suicide prevention and interventions ?National Suicide Hotline telephone number ?[facility] assessment telephone number ?[facility] Emergency Assistance 911 ?[place] and/or Residential Mobile Crisis Unit telephone number ? ?Request made of family/significant other to: ?Remove weapons (e.g., guns, rifles, knives), all items previously/currently identified as safety concern.   ?Remove drugs/medications (over-the-counter, prescriptions, illicit drugs), all items previously/currently identified as a safety concern. ? ?The family member/significant other verbalizes understanding of the suicide prevention education information provided.  The family member/significant other agrees to remove the items of safety concern listed above. ? ?CSW spoke with Mrs. Dorothyann Peng who states that her son has been very depressed, having anger outbursts, and expressing SI.  She states that he has been doing this for several years but states that things have "gotten much worse since his wife and him separated and she told hm that she is seeing someone else now".  She states that her son isolates himself when he is not at work and she worries for his safety since his brother committed suicide a few years ago.  Mrs. Dorothyann Peng states  that she would like to see her son go to therapy and psychiatry that is closer to his home instead of through virtual appointments. She reports that there is a firearm in the home but states that it is locked up.  She states that she is going to remove the firearm from the home "just to be safe".  CSW completed SPE with Mrs. Stanley.  ? ?Darleen Crocker ?10/16/2021, 11:57 AM ?

## 2021-10-16 NOTE — Progress Notes (Signed)
D: Patient came up to med window this am for his medications. He forwards little information about himself. He indicates that he does not have thoughts of self harm. Patient refused his labs this morning due to his fear of needles. His CIWA scores have been less than 4. He denies any AVH; however, does seem preoccupied. He is compliant with his medications. ? ?A: Continue to monitor medication management and MD orders.  Safety checks completed every 15 minutes per protocol.  Offer support and encouragement as needed. ? ?R: Patient is receptive to staff; his behavior is appropriate.   ? ? 10/16/21 1000  ?Psych Admission Type (Psych Patients Only)  ?Admission Status Involuntary  ?Psychosocial Assessment  ?Patient Complaints Anxiety;Depression  ?Eye Contact Brief  ?Facial Expression Flat;Sad  ?Affect Depressed;Sad  ?Speech Soft  ?Interaction Guarded;Minimal  ?Motor Activity Slow  ?Appearance/Hygiene Unremarkable  ?Behavior Characteristics Anxious;Guarded  ?Mood Depressed;Sad  ?Thought Process  ?Coherency WDL  ?Content Blaming others;Blaming self  ?Delusions None reported or observed  ?Perception WDL  ?Hallucination None reported or observed  ?Judgment Impaired  ?Confusion None  ?Danger to Self  ?Current suicidal ideation? Denies  ?Danger to Others  ?Danger to Others None reported or observed  ? ? ?

## 2021-10-16 NOTE — Progress Notes (Signed)
Patient refused Lab work. Stated that he does not like needles.  ?

## 2021-10-16 NOTE — B[facility] Counselor (Signed)
CSW spoke with the Pt who states that he does not want a 30-day treatment center at this time for his substance use.  He states that he has social anxiety and does not feel that he would be comfortable with a 30-day inpatient center.  He states that he would be open to receiving a list of Psychiatrists in the area and a list of local/virtual AA and NA meetings.  CSW will gather these materials and provide them to the Pt.  CSW will inform the providers of this information.  ?

## 2021-10-16 NOTE — Progress Notes (Signed)
NUTRITION ASSESSMENT ? ?Pt identified as at risk on the Malnutrition Screen Tool ? ?INTERVENTION: ?1. Supplements: Ensure Plus High Protein po BID, each supplement provides 350 kcal and 20 grams of protein.  ? ?NUTRITION DIAGNOSIS: ?Unintentional weight loss related to sub-optimal intake as evidenced by pt report.  ? ?Goal: ?Pt to meet >/= 90% of their estimated nutrition needs. ? ?Monitor:  ?PO intake ? ?Assessment:  ?Pt admitted with depression. UDS+ THC ?Pt reports fluctuating appetite. ?Per weight records, pt weighed 151 lbs in July 2022. Current weight: 169 lbs. ?Ensure supplements have been ordered. Receiving daily MVI and thiamine. ? ?Height: ?Ht Readings from Last 1 Encounters:  ?10/14/21 5\' 10"  (1.778 m)  ? ? ?Weight: ?Wt Readings from Last 1 Encounters:  ?10/14/21 77.1 kg  ? ? ?Weight Hx: ?Wt Readings from Last 10 Encounters:  ?10/14/21 77.1 kg  ?10/13/21 77.1 kg  ?06/10/16 84.8 kg  ?06/03/16 79.4 kg  ?03/01/15 81.1 kg  ?10/21/13 73.9 kg  ?10/11/13 70.3 kg  ?09/20/11 69.4 kg  ? ? ?BMI:  Body mass index is 24.39 kg/m?09/22/11 ?Pt meets criteria for normal based on current BMI. ? ?Estimated Nutritional Needs: ?Kcal: 25-30 kcal/kg ?Protein: > 1 gram protein/kg ?Fluid: 1 ml/kcal ? ?Diet Order:  ?Diet Order   ? ?       ?  Diet regular Room service appropriate? Yes; Fluid consistency: Thin  Diet effective now       ?  ? ?  ?  ? ?  ? ?Pt is also offered choice of unit snacks mid-morning and mid-afternoon.  ?Pt is eating as desired.  ? ?Lab results and medications reviewed.  ? ?Marland Kitchen, MS, RD, LDN ?Inpatient Clinical Dietitian ?Contact information available via Amion ? ? ?

## 2021-10-16 NOTE — [facility] Group Notes (Signed)
Patient did not attend morning orientation group.  

## 2021-10-16 NOTE — BHH Counselor (Signed)
Adult Comprehensive Assessment ? ?Patient ID: Shawn Schaefer, male   DOB: 06/04/1981, 41 y.o.   MRN: [id number] ? ?Information Source: ?Information source: Patient ? 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 ?Living/Environment/Situation:  ?Living Arrangements: Parent ?Living conditions (as described by patient or guardian): House/Renting ?Who else lives in the home?: Mother ?How long has patient lived in current situation?: 1 year ?What is atmosphere in current home: Comfortable, Supportive ? ?Family History:  ?Marital status: Separated ?Separated, when?: 1 year ?What types of issues is patient dealing with in the relationship?: "We had multiple fights" ?Are you sexually active?: Yes ?What is your sexual orientation?: Heterosexual ?Has your sexual activity been affected by drugs, alcohol, medication, or emotional stress?: No ?Does patient have children?: Yes ?How many children?: 2 ?How is patient's relationship with their children?: "I have 2 children ages 45 and 72 and they act like they don't want anything to do with me" ? ?Childhood  History:  ?By whom was/is the patient raised?: Mother ?Description of patient's relationship with caregiver when they were a child: "My relationship with my mother was OK" ?Patient's description of current relationship with people who raised him/her: "It is not so good now because my mother is very opinionated" ?How were you disciplined when you got in trouble as a child/adolescent?: Spankings and groundings ?Does patient have siblings?: Yes ?Number of Siblings: 4 ?Description of patient's current relationship with siblings: "I have a twin brother that committed suicide 30 years ago, a brother that lives in [place], a sister that I rarely see, and another sister I don't see at all" ?Did patient suffer any verbal/emotional/physical/sexual abuse as a child?: No ?Did patient suffer from severe childhood neglect?: No ?Has patient ever been sexually abused/assaulted/raped as an adolescent or adult?: No ?Was the patient ever a victim of a crime or a disaster?: No ?Witnessed domestic violence?: No ?Has patient been affected by domestic violence as an adult?: No ? ?Education:  ?Highest grade of school patient has completed: 12th grade, some college ?Currently a student?: No ?Learning disability?: Yes ?What learning problems does patient have?: Pt reports he does not know the name of the disability. ? ?Employment/Work Situation:   ?Employment Situation: Employed ?Where is Patient Currently Employed?: [employer] ?How Long has Patient Been Employed?: 3 years ?Are You Satisfied With Your Job?: Yes ?Do You Work More Than One Job?: No ?Work Stressors: None reported ?Patient's Job has Been Impacted by Current Illness: Yes ?Describe how Patient's Job has Been Impacted: Pt has missed work due to mental health and alcohol use. ?What is the Longest Time Patient has Held a Job?: 3 years ?Where was the Patient Employed at that Time?: [employer] ?Has Patient ever Been in the  Military?:  No ? ?Financial Resources:   ?Financial resources: Income from employment, Private insurance ?Does patient have a representative payee or guardian?: No ? ?Alcohol/Substance Abuse:   ?What has been your use of drugs/alcohol within the last 12 months?: Pt reports binge drinking alcohol and using Marijuana occasionally ?If attempted suicide, did drugs/alcohol play a role in this?: No ?Alcohol/Substance Abuse Treatment Hx: Denies past history ?Has alcohol/substance abuse ever caused legal problems?: Yes (Pt reports having a court date on 10/22/2021 for a 50B) ? ?Social Support System:   ?Patient's Community Support System: Poor ?Describe Community Support System: Mother ?Type of faith/religion: None ?How does patient's faith help to cope with current illness?: N/A ? ?Leisure/Recreation:   ?Do You Have Hobbies?: Yes ?Leisure and Hobbies: Pt reports a loss of interest in hobbies ? ?Strengths/Needs:   ?What is the patient's perception of their strengths?: Resiliant ?Patient states they can use these personal strengths during their treatment to contribute to their recovery: "I just keep going" ?Patient states these barriers may affect/interfere with their treatment: None ?Patient states these barriers may affect their return to the community: None ?Other important information patient would like considered in planning for their treatment: None ? ?Discharge Plan:   ?Currently receiving community mental health services: Yes (From Whom) Raiford Noble for virtual therapy out of [facility] and Leonard Schwartz at [facility] for medication management.) ?Patient states concerns and preferences for aftercare planning are: Pt would like to remain with his current providers for therapy and medication management.  Pt reports he is not interested in inpatient substance use treatment at this time. ?Patient states they will know when they are safe and ready for discharge when: "When I don't feel like hurting myself anymore" ?Does  patient have access to transportation?: Yes (Pt reports having his own car at home) ?Does patient have financial barriers related to discharge medications?: No ?Will patient be returning to same living situation after discharge?: Yes ? ?Summary/Recommendations:   ?Summary and Recommendations (to be completed by the evaluator): Shawn Schaefer is a 41 year old, male, who was admitted to the hospital due to worsening depression and suicidal thoughts.  The Pt reports experiencing symptoms of depression and suicidal thoughts on and off "most of my life".  He states that these symptoms worsened on 11/09/2021 when his wife, who he has been seperated from for approximately 1 year, informed him that she was dating another man.  The Pt reports that for the past year he has been living with his mother due to the seperation.  He states that he has 2 minor children ages 6 and 80 and states "they act like they don't want anything to do with me and won't come around me anymore".  He states that he has 4 siblings as well and that his twin brother committed suicide 30 years ago, his other brother lives in [place], 1 of his sisters rarely comes to see him and he has no contact with the other sister at all.  He also states that he has no current relationship with his father.  The Pt reports no childhood trauma or abuse.  He states that he works for a Orth[employer] and receives [employer] through his company.  He reports binge drinking alcohol and using Marijuana occasionally.  He denies all other substance use at this time.  The Pt does report attending an inpatient treatment center for substance use 3 years ago at [facility].  He also  states that he has an up-coming court date on 10/22/2021 for a 50B that his wife filed prior to his hospital admission. While in the hospital the Pt can benefit from crisis stabilization, medication evaluation, group therapy, psycho-education, case management,  and discharge planning.  Upon discharge the Pt would like to return to his mother's home.  It is recommended that the Pt continue outpatient treatment with [facility] for virtual therapy and [facility] at [facility]

## 2021-10-16 NOTE — Progress Notes (Signed)
?   10/15/21 2126  ?Psych Admission Type (Psych Patients Only)  ?Admission Status Involuntary  ?Psychosocial Assessment  ?Patient Complaints Anergic;Anxiety;Depression  ?Eye Contact Brief  ?Facial Expression Flat;Sad  ?Affect Depressed;Sad  ?Speech Logical/coherent;Soft  ?Interaction Guarded  ?Motor Activity Slow  ?Appearance/Hygiene Unremarkable  ?Behavior Characteristics Anxious;Guarded  ?Mood Depressed;Despair;Sad  ?Thought Process  ?Coherency WDL  ?Content Blaming others;Blaming self  ?Delusions None reported or observed  ?Perception WDL  ?Hallucination None reported or observed  ?Judgment Poor  ?Confusion None  ?Danger to Self  ?Current suicidal ideation? Denies  ?Danger to Others  ?Danger to Others None reported or observed  ? ? ?

## 2021-10-17 LAB — CBC
HCT: 47.8 % (ref 39.0–52.0)
Hemoglobin: 16.4 g/dL (ref 13.0–17.0)
MCH: 31.1 pg (ref 26.0–34.0)
MCHC: 34.3 g/dL (ref 30.0–36.0)
MCV: 90.7 fL (ref 80.0–100.0)
Platelets: 219 10*3/uL (ref 150–400)
RBC: 5.27 MIL/uL (ref 4.22–5.81)
RDW: 12.5 % (ref 11.5–15.5)
WBC: 9.6 10*3/uL (ref 4.0–10.5)
nRBC: 0 % (ref 0.0–0.2)

## 2021-10-17 LAB — LIPID PANEL
Cholesterol: 181 mg/dL (ref 0–200)
HDL: 55 mg/dL (ref >40)
LDL Cholesterol: 77 mg/dL (ref 0–99)
Total CHOL/HDL Ratio: 3.3 RATIO
Triglycerides: 245 mg/dL — ABNORMAL HIGH (ref <150)
VLDL: 49 mg/dL — ABNORMAL HIGH (ref 0–40)

## 2021-10-17 LAB — TSH: TSH: 3.222 u[IU]/mL (ref 0.350–4.500)

## 2021-10-17 LAB — HEMOGLOBIN A1C
Hgb A1c MFr Bld: 5.2 % (ref 4.8–5.6)
Mean Plasma Glucose: 102.54 mg/dL

## 2021-10-17 MED ORDER — NICOTINE POLACRILEX 2 MG MT GUM
2.0000 mg | CHEWING_GUM | OROMUCOSAL | Status: DC | PRN
  Administered 2021-10-18 – 2021-10-20 (×5): 2 mg via ORAL
  Filled 2021-10-17 (×4): qty 1

## 2021-10-17 MED ORDER — TRAZODONE HCL 50 MG PO TABS
50.0000 mg | ORAL_TABLET | Freq: Once | ORAL | Status: AC
  Administered 2021-10-17: 50 mg via ORAL
  Filled 2021-10-17 (×2): qty 1

## 2021-10-17 NOTE — Group Note (Signed)
LCSW Group Therapy Note ? ? ?Group Date: 10/17/2021 ?Start Time: 1300 ?End Time: 1400 ? ?Type of Therapy and Topic:  Group Therapy - Healthy vs Unhealthy Coping Skills ? ?Participation Level:  Active  ? ?Description of Group ?The focus of this group was to determine what unhealthy coping techniques typically are used by group members and what healthy coping techniques would be helpful in coping with various problems. Patients were guided in becoming aware of the differences between healthy and unhealthy coping techniques. Patients were asked to identify 2-3 healthy coping skills they would like to learn to use more effectively. ? ?Therapeutic Goals ?Patients learned that coping is what human beings do all day long to deal with various situations in their lives ?Patients defined and discussed healthy vs unhealthy coping techniques ?Patients identified their preferred coping techniques and identified whether these were healthy or unhealthy ?Patients determined 2-3 healthy coping skills they would like to become more familiar with and use more often. ?Patients provided support and ideas to each other ? ? ?Summary of Patient Progress:  Groups did not occur due to staffing challenges.  A packet that included worksheets and information regarding coping skills was provided to the Pt.  The Pt was given time to ask questions and express any concerns with the CSW.   ? ? ?Therapeutic Modalities ?Cognitive Behavioral Therapy ?Motivational Interviewing ? ?Aram Beecham, LCSWA ?10/17/2021  1:52 PM   ? ?

## 2021-10-17 NOTE — BHH Counselor (Signed)
CSW provided the Pt with a list of local Psychiatirst and a list of NA and AA meetings that can be done both in-person and virtually.  CSW placed these materials in an orange folder and gave them to the Pt.   ?

## 2021-10-17 NOTE — Progress Notes (Signed)
[facility] MD Progress Note ? ?10/17/2021 1:36 PM ?Shawn Schaefer  ?MRN:  [id number] ? ?Subjective:  Patient is a 41 year old male with a psychiatric history of bipolar disorder who was admitted to the psychiatric unit for worsening depression and suicidal thoughts. Patient was medically cleared by [facility] hospital.  ? ?The following psychiatric diagnoses were provided on admission: ?-Bipolar disorder, type 1?, current episode depressed ?-GAD with panic attacks ?-alcohol use disorder ?-cannabis use disorder ?-tobacco use disorder ?-h/o cocaine use ?-h/o opiate use  ?-HTN ? ?Yesterday the psychiatry team made the following recommendations: ?-Increase abilify to 7.5 mg qhs - for bipolar disorder ?-Continue fluoxetine 20 mg once daily  - pt reports this is helpful for mood and anxiety  ?-Continue topiramate 25 mg bid for mood stabilization, impulse control, alcohol use disorder  ?-Continue lisinopril 10 mg once daily for HTN ?-Continue clonidine 0.1 mg twice daily as needed for elevated blood pressure ?-Continue CIWA for alcohol withdrawal and scheduled ativan taper ?  ?-offered natrexone for AUD - pt declined. Will offer again tomorrow ? ? ?On assessment today, patient was lying in bed. Pt reports that he was able to get "good sleep," which was better than the night before according to the patient. He reports that is mood is less depressed, but not able to elaborate further. Pt reported speaking to his mother on the phone earlier and indicated that the conversation "went ok." They discussed getting the patient more help, to which the pt was in agreement. Pt was counseled about enrolling into a substance abuse rehab facility. Pt voiced some concerns about continued depression and feeling down.  ?Pt reports that anxiety continues to be elevated.  ?Pt reports a decrease in frequency and intensity of suicidal ideations. Pt denies any intent or plan. Pt reports he "haven't thought much about it." ?Pt denies HI, denies any intent or  thoughts to harm anyone including his wife's new partner. Pt reports the wife and new partner are not actually together.  ?Denies AV, VH, delusions or paranoia.  ?Pt denies any headaches, nausea, diarrhea, vomiting or any other medication related side effects.  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  ? ? ?Principal Problem: Bipolar 1 disorder, depressed, severe ([place]) ?Diagnosis: Principal Problem: ?  Bipolar 1 disorder, depressed, severe ([place]) ?Active Problems: ?  GAD (generalized anxiety disorder) ?  Alcohol use disorder, severe, dependence ([place]) ?  Tobacco use disorder ? ?Total Time spent with patient: 20 minutes ? ?Past Psychiatric History:  ?Patient reports being diagnosed with bipolar in the past.  Patient reports that he is currently taking Prozac and Depakote, last dose 4/21 or 4/22 due to starting drinking. ?Patient reports being psychiatrically hospitalized about 4 times in the past. ?Patient reports 1 suicide attempt in 2013 by hanging. ?Patient reports "I did not respond to the psychiatric medications".  Per chart reviewed these include, Depakote Prozac lithium Remeron and Cymbalta Adderall Abilify Wellbutrin. ? ?Past Medical History:  ?Past Medical History:  ?Diagnosis Date  ? Anxiety   ? Depression   ? Migraines   ? Suicide attempt [facility])   ? History reviewed. No pertinent surgical history. ?Family History: History reviewed. No pertinent family history. ?Family Psychiatric  History: History reviewed. No pertinent family history. ?Social History:  ?Social History  ? ?Substance and Sexual Activity  ?Alcohol Use Yes  ?  Alcohol/week: 12.0 standard drinks  ? Types: 12 Cans of beer per week  ? Comment: binge drinking on weekends  ?   ?Social History  ? ?Substance and Sexual Activity  ?Drug Use Yes  ? Frequency: 1.0 times per week  ? Types: Marijuana   ?  ?Social History  ? ?Socioeconomic History  ? Marital status: Married  ?  Spouse name: Not on file  ? Number of children: Not on file  ? Years of education: Not on file  ? Highest education level: Not on file  ?Occupational History  ? Not on file  ?Tobacco Use  ? Smoking status: Former  ?  Types: Cigarettes  ? Smokeless tobacco: Current  ?  Types: Chew  ?Substance and Sexual Activity  ? Alcohol use: Yes  ?  Alcohol/week: 12.0 standard drinks  ?  Types: 12 Cans of beer per week  ?  Comment: binge drinking on weekends  ? Drug use: Yes  ?  Frequency: 1.0 times per week  ?  Types: Marijuana  ? Sexual activity: Not Currently  ?  Birth control/protection: None  ?Other Topics Concern  ? Not on file  ?Social History Narrative  ? Not on file  ? ?Social Determinants of Health  ? ?Financial Resource Strain: Not on file  ?Food Insecurity: Not on file  ?Transportation Needs: Not on file  ?Physical Activity: Not on file  ?Stress: Not on file  ?Social Connections: Not on file  ? ?Additional Social History:  ?  ?Sleep: Good ? ?Appetite:  Fair ? ?Current Medications: ?Current Facility-Administered Medications  ?Medication Dose Route Frequency Provider Last Rate Last Admin  ? acetaminophen (TYLENOL) tablet 650 mg  650 mg Oral Q6H PRN Rozetta Nunnery, NP      ? alum & mag hydroxide-simeth (MAALOX/MYLANTA) 200-200-20 MG/5ML suspension 30 mL  30 mL Oral Q4H PRN Rozetta Nunnery, NP      ? ARIPiprazole (ABILIFY) tablet 10 mg  10 mg Oral QHS Massengill, Ovid Curd, MD   10 mg at 10/16/21 2110  ? cloNIDine (CATAPRES) tablet 0.1 mg  0.1 mg Oral BID PRN Janine Limbo, MD   0.1 mg at 10/15/21 [id number]  ? feeding supplement (ENSURE ENLIVE / ENSURE PLUS) liquid 237 mL  237 mL Oral BID BM Massengill, Ovid Curd, MD   237 mL at 10/16/21 1808  ? FLUoxetine (PROZAC) capsule 20 mg  20 mg Oral Daily Massengill, Nathan, MD   20 mg at 10/17/21 [id number]  ? hydrOXYzine (ATARAX) tablet 25 mg  25 mg Oral Q6H PRN Janine Limbo, MD   25 mg at 10/15/21 [id number]  ?  lisinopril (ZESTRIL) tablet 10 mg  10 mg Oral Daily Massengill, Ovid Curd, MD   10 mg at 10/17/21 [id number]  ? loperamide (IMODIUM) capsule 2-4 mg  2-4 mg Oral PRN Massengill, Ovid Curd, MD      ? LORazepam (ATIVAN) tablet 1 mg  1 mg Oral Q6H PRN Massengill, Nathan, MD      ? LORazepam (ATIVAN) tablet 1 mg  1 mg Oral BID Massengill, Ovid Curd, MD   1 mg at 10/17/21 0830  ? Followed by  ? Derrill Memo ON 10/18/2021] LORazepam (ATIVAN) tablet 1 mg  1 mg Oral Daily Massengill, Nathan, MD      ? magnesium hydroxide (MILK OF MAGNESIA) suspension 30 mL  30 mL Oral Daily PRN Rozetta Nunnery, NP      ? multivitamin with minerals tablet 1 tablet  1 tablet Oral Daily Massengill, Ovid Curd, MD   1 tablet  at 10/17/21 0825  ? nicotine polacrilex (NICORETTE) gum 2 mg  2 mg Oral PRN Harlow Asa, MD      ? ondansetron (ZOFRAN-ODT) disintegrating tablet 4 mg  4 mg Oral Q6H PRN Massengill, Ovid Curd, MD      ? thiamine (B-1) injection 100 mg  100 mg Intramuscular Once Massengill, Ovid Curd, MD      ? thiamine tablet 100 mg  100 mg Oral Daily Massengill, Ovid Curd, MD   100 mg at 10/17/21 0825  ? topiramate (TOPAMAX) tablet 25 mg  25 mg Oral Q12H Massengill, Ovid Curd, MD   25 mg at 10/17/21 0825  ? traZODone (DESYREL) tablet 50 mg  50 mg Oral QHS PRN Janine Limbo, MD   50 mg at 10/16/21 2111  ? ? ?Lab Results: No results found for this or any previous visit (from the past 48 hour(s)). ? ?Blood Alcohol level:  ?Lab Results  ?Component Value Date  ? ETH <10 10/13/2021  ? ETH <5 02/27/2015  ? ? ?Metabolic Disorder Labs: ?No results found for: HGBA1C, MPG ?No results found for: PROLACTIN ?No results found for: CHOL, TRIG, HDL, CHOLHDL, VLDL, LDLCALC ? ?Physical Findings: ?AIMS: Facial and Oral Movements ?Muscles of Facial Expression: None, normal ?Lips and Perioral Area: None, normal ?Jaw: None, normal ?Tongue: None, normal,Extremity Movements ?Upper (arms, wrists, hands, fingers): None, normal ?Lower (legs, knees, ankles, toes): None, normal, Trunk  Movements ?Neck, shoulders, hips: None, normal, Overall Severity ?Severity of abnormal movements (highest score from questions above): None, normal ?Incapacitation due to abnormal movements: None, normal ?Patient's awarenes

## 2021-10-17 NOTE — Group Note (Unsigned)
Date:  10/17/2021 ?Time:  10:49 AM ? ?Group Topic/Focus:  ?Orientation:   The focus of this group is to educate the patient on the purpose and policies of crisis stabilization and provide a format to answer questions about their admission.  The group details unit policies and expectations of patients while admitted. ? ? ? ? ?Participation Level:  {BHH PARTICIPATION LEVEL:22264} ? ?Participation Quality:  {BHH PARTICIPATION QUALITY:22265} ? ?Affect:  {BHH AFFECT:22266} ? ?Cognitive:  {BHH COGNITIVE:22267} ? ?Insight: {BHH Insight2:20797} ? ?Engagement in Group:  {BHH ENGAGEMENT IN GROUP:22268} ? ?Modes of Intervention:  {BHH MODES OF INTERVENTION:22269} ? ?Additional Comments:  *** ? ?Shawn Schaefer ?10/17/2021, 10:49 AM ? ?

## 2021-10-17 NOTE — [facility] Group Notes (Signed)
Patient did not attend the relaxation group. 

## 2021-10-17 NOTE — Progress Notes (Signed)
? ? ?   10/16/21 2110  ?Psych Admission Type (Psych Patients Only)  ?Admission Status Involuntary  ?Psychosocial Assessment  ?Patient Complaints Anxiety;Depression  ?Eye Contact Brief  ?Facial Expression Flat;Sad  ?Affect Depressed;Sad  ?Speech Soft  ?Interaction Forwards little;Guarded;Minimal  ?Motor Activity Slow  ?Appearance/Hygiene Unremarkable  ?Behavior Characteristics Anxious;Guarded  ?Mood Depressed;Sad  ?Thought Process  ?Coherency WDL  ?Content Blaming others;Blaming self  ?Delusions None reported or observed  ?Perception WDL  ?Hallucination None reported or observed  ?Judgment Impaired  ?Confusion None  ?Danger to Self  ?Current suicidal ideation? Denies  ?Agreement Not to Harm Self Yes  ?Description of Agreement verbal contract for safety  ?Danger to Others  ?Danger to Others None reported or observed  ? ? ?

## 2021-10-17 NOTE — Progress Notes (Addendum)
D: Patient is focused on discharge. He denies any thoughts of self harm or AVH. Patient is eating and sleeping well. He feels he is ready for discharge. Patient rates his hopelessness, depression and anxiety as a 4 today. His goal today is to "have a positive outlook and think positive." Patient is compliant with his medications. Nicorette gum ordered for nicotine withdrawal. ? ?A: Continue to monitor medication management and MD orders.  Safety checks completed every 15 minutes per protocol.  Offer support and encouragement as needed. ? ?R: Patient is receptive to staff; his behavior is appropriate.   ? ? 10/17/21 0900  ?Psych Admission Type (Psych Patients Only)  ?Admission Status Involuntary  ?Psychosocial Assessment  ?Patient Complaints Anxiety;Decreased concentration  ?Eye Contact Avoids  ?Facial Expression Flat;Sad  ?Affect Depressed;Sad  ?Speech Soft;Slow  ?Interaction Minimal  ?Motor Activity Slow  ?Appearance/Hygiene Unremarkable  ?Behavior Characteristics Guarded  ?Mood Depressed;Sad  ?Thought Process  ?Coherency WDL  ?Content Blaming others;Blaming self  ?Delusions None reported or observed  ?Perception WDL  ?Hallucination None reported or observed  ?Judgment Impaired  ?Confusion None  ?Danger to Self  ?Current suicidal ideation? Denies  ?Agreement Not to Harm Self Yes  ?Description of Agreement verbal  ?Danger to Others  ?Danger to Others None reported or observed  ? ? ?

## 2021-10-17 NOTE — Plan of Care (Signed)
?  Problem: Coping: Goal: Coping ability will improve Outcome: Progressing   Problem: Medication: Goal: Compliance with prescribed medication regimen will improve Outcome: Progressing   Problem: Self-Concept: Goal: Will verbalize positive feelings about self Outcome: Progressing   

## 2021-10-17 NOTE — Plan of Care (Signed)
?  Problem: Coping: Goal: Coping ability will improve Outcome: Not Progressing   Problem: Health Behavior/Discharge Planning: Goal: Identification of resources available to assist in meeting health care needs will improve Outcome: Not Progressing   Problem: Self-Concept: Goal: Will verbalize positive feelings about self Outcome: Not Progressing   

## 2021-10-18 MED ORDER — LORAZEPAM 1 MG PO TABS
1.0000 mg | ORAL_TABLET | Freq: Once | ORAL | Status: AC
  Administered 2021-10-18: 1 mg via ORAL
  Filled 2021-10-18: qty 1

## 2021-10-18 NOTE — Progress Notes (Signed)
? ? ?   10/18/21 2125  ?Psych Admission Type (Psych Patients Only)  ?Admission Status Involuntary  ?Psychosocial Assessment  ?Patient Complaints Anxiety;Depression  ?Eye Contact Fair  ?Facial Expression Flat  ?Affect Appropriate to circumstance  ?Speech Soft;Logical/coherent  ?Interaction Forwards little  ?Motor Activity Slow  ?Behavior Characteristics Cooperative  ?Mood Pleasant;Depressed  ?Thought Process  ?Coherency WDL  ?Content WDL  ?Delusions None reported or observed  ?Perception WDL  ?Hallucination None reported or observed  ?Judgment Limited  ?Confusion None  ?Danger to Self  ?Current suicidal ideation? Denies  ?Agreement Not to Harm Self Yes  ?Description of Agreement verbally contracts for safety  ?Danger to Others  ?Danger to Others None reported or observed  ? ? ?

## 2021-10-18 NOTE — [facility] Group Notes (Signed)
Goals Group ?4/29//2023 ? ? ?Group Focus: affirmation, clarity of thought, and goals/reality orientation ?Treatment Modality:  Psychoeducation ?Interventions utilized were assignment, group exercise, and support ?Purpose: To be able to understand and verbalize the reason for their admission to the hospital. To understand that the medication helps with their chemical imbalance but they also need to work on their choices in life. To be challenged to develop a list of 30 positives about themselves. Also introduce the concept that "feelings" are not reality. ? ?Participation Level:  Active ? ?Participation Quality:  Appropriate ? ?Affect:  Appropriate ? ?Cognitive:  Appropriate ? ?Insight:  Improving ? ?Engagement in Group:  Engaged ? ?Additional Comments:  Rates his energy at a 7/10. Participated in the group. ? ?Shawn Schaefer A ?

## 2021-10-18 NOTE — Progress Notes (Signed)
? ? ?   10/17/21 2125  ?Psych Admission Type (Psych Patients Only)  ?Admission Status Involuntary  ?Psychosocial Assessment  ?Patient Complaints Anxiety  ?Eye Contact Fair  ?Facial Expression Flat  ?Affect Anxious  ?Speech Soft;Slow  ?Interaction Forwards little  ?Motor Activity Slow  ?Appearance/Hygiene Unremarkable  ?Behavior Characteristics Cooperative  ?Mood Pleasant;Anxious  ?Thought Process  ?Coherency WDL  ?Content Blaming others;Blaming self  ?Delusions None reported or observed  ?Perception WDL  ?Hallucination None reported or observed  ?Judgment Impaired  ?Confusion None  ?Danger to Self  ?Current suicidal ideation? Denies  ?Agreement Not to Harm Self Yes  ?Description of Agreement verbal contract for safety  ?Danger to Others  ?Danger to Others None reported or observed  ? ? ?

## 2021-10-18 NOTE — Progress Notes (Addendum)
[facility] MD Progress Note ? ?10/18/2021 5:08 PM ?Shawn Schaefer  ?MRN:  [id number] ? ?Subjective:  "I am calm now, the crises phase is over. I am able to relate with the fellow patients." ? ?Brief history: Patient is a 41 year old male with a psychiatric history of bipolar disorder who was admitted to the psychiatric unit for worsening depression and suicidal thoughts. Patient was medically cleared by [facility] hospital.  ? ?Daily Notes 10/18/21: Seen and examined face to face sitting calmly on a chair. Chart reviewed and findings shared with the tx team and discussed with Dr. Nelda Marseille. A/O x 4. Stated, "My relationship it has been a rough time with the wife for the past one year". Speech fluent and with normal volume. Mood brighter and affect full range and euthymic. Actively participating in therapeutic milieu and group activities. ? ?Reported he is living with his mother for the past one year and thought he could rebuild his marriage. However, wife recently told him that she is talking to another guy which made him attempted to commit suicide. Denied SI/HI/AVH today. Rated anxiety as "3" and depression as "1" on a scale of 0 to 10. Endorsed sleeping for 5 hours last night and has good appetite. Continues with ordered medications and no changes at this time. ? ?Principal Problem: Bipolar 1 disorder, depressed, severe ([place]) ? ?Diagnosis: Principal Problem: ?  Bipolar 1 disorder, depressed, severe ([place]) ?Active Problems: ?  GAD (generalized anxiety disorder) ?  Alcohol use disorder, severe, dependence ([place]) ?  Tobacco use disorder ? ?Total Time spent with patient: 30 minutes ? ?Past Psychiatric History:   Patient reports being diagnosed with bipolar in the past.  Patient reports that he is currently taking Prozac and Depakote, last dose 4 to 5 days ago due to starting drinking. ?Patient reports being psychiatrically hospitalized about 4 times ?Patient reports 1 suicide attempt in 2013 by hanging ?Patient reports "I did not respond  to the psychiatric medications".  Per chart reviewed these include, Depakote Prozac lithium Remeron and Cymbalta Adderall Abilify Wellbutrin. ?  ? ?Past Medical History:  ?Past Medical History:  ?Diagnosis Date  ? Anxiety   ? Depression   ? Migraines   ? Suicide attempt [facility])   ? History reviewed. No pertinent surgical history. ? ?Family History: History reviewed. No pertinent family history. ? ?Family Psychiatric  History: Non indicated  ? ?Social History:  ?Social History  ? ?Substance and Sexual Activity  ?Alcohol Use Yes  ? Alcohol/week: 12.0 standard drinks  ? Types: 12 Cans of beer per week  ? Comment: binge drinking on weekends  ?   ?Social History  ? ?Substance and Sexual Activity  ?Drug Use Yes  ? Frequency: 1.0 times per week  ? Types: Marijuana  ?  ?Social History  ? ?Socioeconomic History  ? Marital status: Married  ?  Spouse name: Not on file  ? Number of children: Not on file  ? Years of education: Not on file  ? Highest education level: Not on file  ?Occupational History  ? Not on file  ?Tobacco Use  ? Smoking status: Former  ?  Types: Cigarettes  ? Smokeless tobacco: Current  ?  Types: Chew  ?Substance and Sexual Activity  ? Alcohol use: Yes  ?  Alcohol/week: 12.0 standard drinks  ?  Types: 12 Cans of beer per week  ?  Comment: binge drinking on weekends  ? Drug use: Yes  ?  Frequency: 1.0 times per week  ?  Types: Marijuana  ? Sexual activity: Not Currently  ?  Birth control/protection: None  ?Other Topics Concern  ? Not on file  ?Social History Narrative  ? Not on file  ? ?Social Determinants of Health  ? ?Financial Resource Strain: Not on file  ?Food Insecurity: Not on file  ?Transportation Needs: Not on file  ?Physical Activity: Not on file  ?Stress: Not on file  ?Social Connections: Not on file  ? ?Additional Social History:  ?  ?Sleep: Good ? ?Appetite:  Good ? ?Current Medications: ?Current Facility-Administered Medications  ?Medication Dose Route Frequency Provider Last Rate Last Admin  ?  acetaminophen (TYLENOL) tablet 650 mg  650 mg Oral Q6H PRN Rozetta Nunnery, NP      ? alum & mag hydroxide-simeth (MAALOX/MYLANTA) 200-200-20 MG/5ML suspension 30 mL  30 mL Oral Q4H PRN Lindon Romp A, NP   30 mL at 10/17/21 2209  ? ARIPiprazole (ABILIFY) tablet 10 mg  10 mg Oral QHS Massengill, Ovid Curd, MD   10 mg at 10/17/21 2126  ? cloNIDine (CATAPRES) tablet 0.1 mg  0.1 mg Oral BID PRN Janine Limbo, MD   0.1 mg at 10/15/21 [id number]  ? feeding supplement (ENSURE ENLIVE / ENSURE PLUS) liquid 237 mL  237 mL Oral BID BM Massengill, Ovid Curd, MD   237 mL at 10/18/21 1426  ? FLUoxetine (PROZAC) capsule 20 mg  20 mg Oral Daily Massengill, Nathan, MD   20 mg at 10/18/21 0841  ? lisinopril (ZESTRIL) tablet 10 mg  10 mg Oral Daily Massengill, Nathan, MD   10 mg at 10/18/21 0841  ? magnesium hydroxide (MILK OF MAGNESIA) suspension 30 mL  30 mL Oral Daily PRN Rozetta Nunnery, NP      ? multivitamin with minerals tablet 1 tablet  1 tablet Oral Daily Massengill, Nathan, MD   1 tablet at 10/18/21 0841  ? nicotine polacrilex (NICORETTE) gum 2 mg  2 mg Oral PRN Harlow Asa, MD   2 mg at 10/18/21 1433  ? thiamine (B-1) injection 100 mg  100 mg Intramuscular Once Massengill, Ovid Curd, MD      ? thiamine tablet 100 mg  100 mg Oral Daily Massengill, Ovid Curd, MD   100 mg at 10/18/21 0841  ? topiramate (TOPAMAX) tablet 25 mg  25 mg Oral Q12H Massengill, Ovid Curd, MD   25 mg at 10/18/21 0841  ? traZODone (DESYREL) tablet 50 mg  50 mg Oral QHS PRN Janine Limbo, MD   50 mg at 10/17/21 2126  ? ? ?Lab Results:  ?Results for orders placed or performed during the hospital encounter of 10/14/21 (from the past 48 hour(s))  ?Hemoglobin A1c     Status: None  ? Collection Time: 10/17/21  6:24 PM  ?Result Value Ref Range  ? Hgb A1c MFr Bld 5.2 4.8 - 5.6 %  ?  Comment: (NOTE) ?Pre diabetes:          5.7%-6.4% ? ?Diabetes:              >6.4% ? ?Glycemic control for   <7.0% ?adults with diabetes ?  ? Mean Plasma Glucose 102.54 mg/dL  ?  Comment:  Performed at [facility], [place] [address] [place], [place] [address]  ?Lipid panel     Status: Abnormal  ? Collection Time: 10/17/21  6:24 PM  ?Result Value Ref Range  ? Cholesterol 181 0 - 200 mg/dL  ? Triglycerides 245 (H) <150 mg/dL  ? HDL 55 >40 mg/dL  ? Total  CHOL/HDL Ratio 3.3 RATIO  ? VLDL 49 (H) 0 - 40 mg/dL  ? LDL Cholesterol 77 0 - 99 mg/dL  ?  Comment:        ?Total Cholesterol/HDL:CHD Risk ?Coronary Heart Disease Risk Table ?                    Men   Women ? 1/2 Average Risk   3.4   3.3 ? Average Risk       5.0   4.4 ? 2 X Average Risk   9.6   7.1 ? 3 X Average Risk  23.4   11.0 ?       ?Use the calculated Patient Ratio ?above and the CHD Risk Table ?to determine the patient's CHD Risk. ?       ?ATP III CLASSIFICATION (LDL): ? <100     mg/dL   Optimal ? 100-129  mg/dL   Near or Above ?                   Optimal ? 130-159  mg/dL   Borderline ? 160-189  mg/dL   High ? >190     mg/dL   Very High ?Performed at [facility], [place] [address] [place], [place] [address] ?  ?TSH     Status: None  ? Collection Time: 10/17/21  6:24 PM  ?Result Value Ref Range  ? TSH 3.222 0.350 - 4.500 uIU/mL  ?  Comment: Performed by a 3rd Generation assay with a functional sensitivity of <=0.01 uIU/mL. ?Performed at [facility], [place] [address], [place], [place] [address] ?  ?CBC     Status: None  ? Collection Time: 10/17/21  6:24 PM  ?Result Value Ref Range  ? WBC 9.6 4.0 - 10.5 K/uL  ? RBC 5.27 4.22 - 5.81 MIL/uL  ? Hemoglobin 16.4 13.0 - 17.0 g/dL  ? HCT 47.8 39.0 - 52.0 %  ? MCV 90.7 80.0 - 100.0 fL  ? MCH 31.1 26.0 - 34.0 pg  ? MCHC 34.3 30.0 - 36.0 g/dL  ? RDW 12.5 11.5 - 15.5 %  ? Platelets 219 150 - 400 K/uL  ? nRBC 0.0 0.0 - 0.2 %  ?  Comment: Performed at [facility], [place] [address], [place], [place] [address]  ? ? ?Blood Alcohol level:  ?Lab Results  ?Component Value Date  ? ETH <10 10/13/2021  ? ETH <5 02/27/2015  ? ? ?Metabolic Disorder Labs: ?Lab  Results  ?Component Value Date  ? HGBA1C 5.2 10/17/2021  ? MPG 102.54 10/17/2021  ? ?No results found for: PROLACTIN ?Lab Results  ?Component Value Date  ? CHOL 181 10/17/2021  ? TRIG 245 (H) 10/17/2021  ? HD

## 2021-10-18 NOTE — Progress Notes (Signed)
Pt A & O to self, place, time and situation. Denies SI, HI, AVH and pain when assessed. Presents with flat affect, depressed mood, fair eye contact and logical / soft speech. Rates his anxiety and depression both 1/10 with current stressor being discharge request. Per pt "I just want to go home and get back to work. I have lots of stuff to do with my job. 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 Pt observed to be guarded but forwards on interactions. Attended and participated in scheduled groups. Denies active withdrawal symptoms. Emotional support and reassurance provided to pt. Q 15 minutes safety checks maintained without self harm gestures / outburst thus far. Verbal education provided on all medications and effects monitored. Encouraged pt to voice concerns and comply with unit routines. Pt tolerates all meals, medications and fluids well without discomfort. Pt went off unit for activities and meals, returned without issues.  ?

## 2021-10-18 NOTE — Plan of Care (Signed)
?  Problem: Coping: ?Goal: Coping ability will improve ?Outcome: Progressing ?  ?Problem: Medication: ?Goal: Compliance with prescribed medication regimen will improve ?Outcome: Progressing ?  ?Problem: Self-Concept: ?Goal: Will verbalize positive feelings about self ?Outcome: Not Progressing ?  ?

## 2021-10-18 NOTE — Group Note (Signed)
LCSW Group Therapy Note ? ?No social work group was held today due to staffing of one social worker on Adult unit and  high number of admissions that required initial psychosocial assessments.  The following was provided to the patient in lieu of in-person group: ? ?Healthy vs. Unhealthy Supports and Coping Skills ? ? ?Unhealthy                                                              Healthy ?Works (at first) Works   ?Stops working or starts hurting Continues working  ?Fast Usually takes time to develop  ?Easy Often difficult to learn  ?Usually a habit Usually unknown, has to become a habit  ?Can do alone Often need to reach out for help   ?Leads to loss Leads to gain  ?   ?   ? ?My Unhealthy Coping Skills                                    My Healthy Coping Skills ?   ?   ?   ?   ?   ?   ?   ? ?My Unhealthy Supports                                           My Healthy Supports ?   ?   ?   ?   ?   ?   ?   ? ? ? ? ? ?Use the stationery provided to write a Goodbye Letter to one of your unhealthy coping skills or unhealthy supports. ? ?Share with other patients as you desire. ? ?Shawn Shidler Grossman-Orr, LCSW ?10/18/2021  ?3:59 PM  ?   ?

## 2021-10-18 NOTE — [facility] Group Notes (Signed)
.  Psychoeducational Group Note ? ? ? ?Date:  4/29//23 ?Time: 1300-1400 ? ? ? ?Purpose of Group: . The group focus' on teaching patients on how to identify their needs and their Life Skills:  Schaefer group where two lists are made. What people need and what are things that we do that are unhealthy. The lists are developed by the patients and it is explained that we often do the actions that are not healthy to get our list of needs met. ? ?Goal:: to develop the coping skills needed to get their needs met ? ?Participation Level:  Active ? ?Participation Quality:  Appropriate ? ?Affect:  Appropriate ? ?Cognitive:  Oriented ? ?Insight:  Improving ? ?Engagement in Group:  Engaged ? ?Additional Comments: Rates energy at Schaefer 7/10. Participated fully in the group. ? ?Shawn Schaefer ? ?

## 2021-10-19 NOTE — [facility] Group Notes (Signed)
Adult Psychoeducational Group Not ?Date:  10/19/2021 ?Time:  0900-1045 ?Group Topic/Focus: PROGRESSIVE RELAXATION. Schaefer group where deep breathing is taught and tensing and relaxation muscle groups is used. Imagery is used as well.  Pts are asked to imagine 3 pillars that hold them up when they are not able to hold themselves up and to share that with the group. ? ?Participation Level:  did not attend ? ?Shawn Schaefer ? ?

## 2021-10-19 NOTE — Group Note (Signed)
[facility] LCSW Group Therapy Note ? ?Date/Time:  10/19/2021   ? ?Type of Therapy and Topic:  Group Therapy:  Using Music to Encourage Yourself ? ?Participation Level:  Did Not Attend  ? ?Description of Group: ?In this process group, members listened to a variety of music through choosing from CSW's list #1 through #25.  Patients identified the messages received from those songs and how the music affected their emotions.  Patients were encouraged to use music as a coping skill at home, but to be mindful of the choices made.  Patients discussed how this knowledge can help with wellness and recovery in various ways including managing depression and anxiety as well as encouraging healthy sleep habits.   ? ?Therapeutic Goals: ?Patients will explore the impact of different songs on mood ?Patients will verbalize the thoughts they have when listening to different types of music ?Patients will identify music that is soothing to them as well as music that is energizing to them ?Patients will discuss how to use this knowledge to assist in maintaining wellness and recovery ?Patients will explore the use of music as a coping skill ?Patients will encourage one another ? ?Summary of Patient Progress:  The patient was invited to group, did not attend. ? ?Therapeutic Modalities: ?Solution Focused Brief Therapy ?Activity ? ? ?Miki Labuda Grossman-Orr, LCSW ?  ?

## 2021-10-19 NOTE — Plan of Care (Signed)
Received message that [name] patient's mother called wanting to speak to doctor and not NP. I attempted to call [phone]) and call went straight to un-identified voicemail so no message left. Primary team can attempt to call again tomorrow.  ? ?Bartholomew Crews, MD, FAPA ?

## 2021-10-19 NOTE — Progress Notes (Addendum)
D: Patient is calm and cooperative. His mood appears to have brighten since his admission. Patient denies any thoughts of self harm. Patient rates his depression and hopelessness as a 0; he rates his anxiety as a 1. He is not responding to internal stimuli. He appears to be eating/sleeping well. Patient is compliant with his medications. His discharge plan is to follow up with outpatient providers. Patient is focused on discharge. ? ?A: Continue to monitor medication management and MD orders.  Safety checks completed every 15 minutes per protocol.  Offer support and encouragement as needed. ? ?R: Patient is receptive to staff; his behavior is appropriate.   ? ? 10/19/21 0900  ?Psych Admission Type (Psych Patients Only)  ?Admission Status Involuntary  ?Psychosocial Assessment  ?Patient Complaints None  ?Eye Contact Fair  ?Facial Expression Flat  ?Affect Appropriate to circumstance  ?Speech Soft  ?Interaction Minimal  ?Motor Activity Slow  ?Appearance/Hygiene Unremarkable  ?Behavior Characteristics Cooperative  ?Mood Pleasant  ?Thought Process  ?Coherency WDL  ?Content WDL  ?Delusions None reported or observed  ?Perception WDL  ?Hallucination None reported or observed  ?Judgment Limited  ?Confusion None  ?Danger to Self  ?Current suicidal ideation? Denies  ?Agreement Not to Harm Self Yes  ?Description of Agreement verbal  ?Danger to Others  ?Danger to Others None reported or observed  ? ? ?

## 2021-10-19 NOTE — Progress Notes (Addendum)
[facility] MD Progress Note ? ?10/19/2021 4:30 PM ?Shawn Schaefer  ?MRN:  [id number] ? ?Subjective:  "I am calm and good because the people here brought out the best in me." ? ?Brief history: Patient is a 41 year old male with a psychiatric history of bipolar disorder who was admitted to the psychiatric unit for worsening depression and suicidal thoughts. Patient was medically cleared by [facility] hospital.  ? ?Daily Notes 10/19/21: Seen and examined face to face sitting calmly on a chair. Chart reviewed and findings shared with the tx team and discussed with Dr. Mason Jim. Alert and oriented to person, time, place and situation. Stated, "I will continue to try and mend the relationship with my wife, because it has been a rough time for the past one year". Speech fluent and with normal volume. Mood brighter and affect full range and euthymic. Memory and judgement fair and insight is good. Actively participating in therapeutic milieu and group activities. ? ?Denied SI/HI/AVH today. Rated anxiety as "0" and depression as "0" on a scale of 0 to 10. Endorsed sleeping for 8 hours last night and has good appetite. Continues with ordered medications and no changes at this time. Continue to assess for safety and stabilization. ? ?Principal Problem: Bipolar 1 disorder, depressed, severe (HCC) ? ?Diagnosis: Principal Problem: ?  Bipolar 1 disorder, depressed, severe (HCC) ?Active Problems: ?  GAD (generalized anxiety disorder) ?  Alcohol use disorder, severe, dependence (HCC) ?  Tobacco use disorder ? ?Total Time spent with patient: 30 minutes ? ?Past Psychiatric History:   Patient reports being diagnosed with bipolar in the past.  Patient reports that he is currently taking Prozac and Depakote, last dose 4 to 5 days ago due to starting drinking. ?Patient reports being psychiatrically hospitalized about 4 times ?Patient reports 1 suicide attempt in 2013 by hanging ?Patient reports "I did not respond to the psychiatric medications".  Per chart  reviewed these include, Depakote Prozac lithium Remeron and Cymbalta Adderall Abilify Wellbutrin. ?  ? ?Past Medical History:  ?Past Medical History:  ?Diagnosis Date  ? Anxiety   ? Depression   ? Migraines   ? Suicide attempt [facility])   ? History reviewed. No pertinent surgical history. ? ?Family History: History reviewed. No pertinent family history. ? ?Family Psychiatric  History: Non indicated  ? ?Social History:  ?Social History  ? ?Substance and Sexual Activity  ?Alcohol Use Yes  ? Alcohol/week: 12.0 standard drinks  ? Types: 12 Cans of beer per week  ? Comment: binge drinking on weekends  ?   ?Social History  ? ?Substance and Sexual Activity  ?Drug Use Yes  ? Frequency: 1.0 times per week  ? Types: Marijuana  ?  ?Social History  ? ?Socioeconomic History  ? Marital status: Married  ?  Spouse name: Not on file  ? Number of children: Not on file  ? Years of education: Not on file  ? Highest education level: Not on file  ?Occupational History  ? Not on file  ?Tobacco Use  ? Smoking status: Former  ?  Types: Cigarettes  ? Smokeless tobacco: Current  ?  Types: Chew  ?Substance and Sexual Activity  ? Alcohol use: Yes  ?  Alcohol/week: 12.0 standard drinks  ?  Types: 12 Cans of beer per week  ?  Comment: binge drinking on weekends  ? Drug use: Yes  ?  Frequency: 1.0 times per week  ?  Types: Marijuana  ? Sexual activity: Not Currently  ?  Birth  control/protection: None  ?Other Topics Concern  ? Not on file  ?Social History Narrative  ? Not on file  ? ?Social Determinants of Health  ? ?Financial Resource Strain: Not on file  ?Food Insecurity: Not on file  ?Transportation Needs: Not on file  ?Physical Activity: Not on file  ?Stress: Not on file  ?Social Connections: Not on file  ? ?Additional Social History:  ?  ?Sleep: Good ? ?Appetite:  Good ? ?Current Medications: ?Current Facility-Administered Medications  ?Medication Dose Route Frequency Provider Last Rate Last Admin  ? acetaminophen (TYLENOL) tablet 650 mg  650 mg  Oral Q6H PRN Jackelyn PolingBerry, Jason A, NP      ? alum & mag hydroxide-simeth (MAALOX/MYLANTA) 200-200-20 MG/5ML suspension 30 mL  30 mL Oral Q4H PRN Nira ConnBerry, Jason A, NP   30 mL at 10/17/21 2209  ? ARIPiprazole (ABILIFY) tablet 10 mg  10 mg Oral QHS Massengill, Harrold DonathNathan, MD   10 mg at 10/18/21 2124  ? cloNIDine (CATAPRES) tablet 0.1 mg  0.1 mg Oral BID PRN Phineas InchesMassengill, Nathan, MD   0.1 mg at 10/15/21 16100626  ? feeding supplement (ENSURE ENLIVE / ENSURE PLUS) liquid 237 mL  237 mL Oral BID BM Massengill, Harrold DonathNathan, MD   237 mL at 10/18/21 1426  ? FLUoxetine (PROZAC) capsule 20 mg  20 mg Oral Daily Massengill, Nathan, MD   20 mg at 10/19/21 0744  ? lisinopril (ZESTRIL) tablet 10 mg  10 mg Oral Daily Massengill, Nathan, MD   10 mg at 10/19/21 0744  ? magnesium hydroxide (MILK OF MAGNESIA) suspension 30 mL  30 mL Oral Daily PRN Nira ConnBerry, Jason A, NP   30 mL at 10/19/21 1407  ? multivitamin with minerals tablet 1 tablet  1 tablet Oral Daily Massengill, Harrold DonathNathan, MD   1 tablet at 10/19/21 0744  ? nicotine polacrilex (NICORETTE) gum 2 mg  2 mg Oral PRN Comer LocketSingleton, Dimitri Dsouza E, MD   2 mg at 10/18/21 1821  ? thiamine (B-1) injection 100 mg  100 mg Intramuscular Once Massengill, Harrold DonathNathan, MD      ? thiamine tablet 100 mg  100 mg Oral Daily Massengill, Harrold DonathNathan, MD   100 mg at 10/19/21 0744  ? topiramate (TOPAMAX) tablet 25 mg  25 mg Oral Q12H Massengill, Harrold DonathNathan, MD   25 mg at 10/19/21 0744  ? traZODone (DESYREL) tablet 50 mg  50 mg Oral QHS PRN Phineas InchesMassengill, Nathan, MD   50 mg at 10/18/21 2124  ? ? ?Lab Results:  ?Results for orders placed or performed during the hospital encounter of 10/14/21 (from the past 48 hour(s))  ?Hemoglobin A1c     Status: None  ? Collection Time: 10/17/21  6:24 PM  ?Result Value Ref Range  ? Hgb A1c MFr Bld 5.2 4.8 - 5.6 %  ?  Comment: (NOTE) ?Pre diabetes:          5.7%-6.4% ? ?Diabetes:              >6.4% ? ?Glycemic control for   <7.0% ?adults with diabetes ?  ? Mean Plasma Glucose 102.54 mg/dL  ?  Comment: Performed at [facility], [address] [place], [place] [address]  ?Lipid panel     Status: Abnormal  ? Collection Time: 10/17/21  6:24 PM  ?Result Value Ref Range  ? Cholesterol 181 0 - 200 mg/dL  ? Triglycerides 245 (H) <150 mg/dL  ? HDL 55 >40 mg/dL  ? Total CHOL/HDL Ratio 3.3 RATIO  ? VLDL 49 (H)  0 - 40 mg/dL  ? LDL Cholesterol 77 0 - 99 mg/dL  ?  Comment:        ?Total Cholesterol/HDL:CHD Risk ?Coronary Heart Disease Risk Table ?                    Men   Women ? 1/2 Average Risk   3.4   3.3 ? Average Risk       5.0   4.4 ? 2 X Average Risk   9.6   7.1 ? 3 X Average Risk  23.4   11.0 ?       ?Use the calculated Patient Ratio ?above and the CHD Risk Table ?to determine the patient's CHD Risk. ?       ?ATP III CLASSIFICATION (LDL): ? <100     mg/dL   Optimal ? 604-540  mg/dL   Near or Above ?                   Optimal ? 130-159  mg/dL   Borderline ? 160-189  mg/dL   High ? >981     mg/dL   Very High ?Performed at [facility], [address] [place], [place] [address] ?  ?TSH     Status: None  ? Collection Time: 10/17/21  6:24 PM  ?Result Value Ref Range  ? TSH 3.222 0.350 - 4.500 uIU/mL  ?  Comment: Performed by a 3rd Generation assay with a functional sensitivity of <=0.01 uIU/mL. ?Performed at [facility], [address], [place], [place] [address] ?  ?CBC     Status: None  ? Collection Time: 10/17/21  6:24 PM  ?Result Value Ref Range  ? WBC 9.6 4.0 - 10.5 K/uL  ? RBC 5.27 4.22 - 5.81 MIL/uL  ? Hemoglobin 16.4 13.0 - 17.0 g/dL  ? HCT 47.8 39.0 - 52.0 %  ? MCV 90.7 80.0 - 100.0 fL  ? MCH 31.1 26.0 - 34.0 pg  ? MCHC 34.3 30.0 - 36.0 g/dL  ? RDW 12.5 11.5 - 15.5 %  ? Platelets 219 150 - 400 K/uL  ? nRBC 0.0 0.0 - 0.2 %  ?  Comment: Performed at [facility], [address] [place], [place] [address]  ? ? ?Blood Alcohol level:  ?Lab Results  ?Component Value Date  ? ETH <10 10/13/2021  ? ETH <5 02/27/2015  ? ? ?Metabolic Disorder Labs: ?Lab Results  ?Component Value  Date  ? HGBA1C 5.2 10/17/2021  ? MPG 102.54 10/17/2021  ? ?No results found for: PROLACTIN ?Lab Results  ?Component Value Date  ? CHOL 181 10/17/2021  ? TRIG 245 (H) 10/17/2021  ? HDL 55 10/17/2021  ? CHOLH

## 2021-10-19 NOTE — BHH Group Notes (Signed)
Adult Psychoeducational Group  ?Date:  10/19/2021 ?Time:  1300-1400 ? ?Group Topic/Focus: Continuation of the group from Saturday. Looking at the lists that were created and talking about what needs to be done with the homework of 30 positives about themselves.  ?                                   Talking about taking their power back and helping themselves to develop Schaefer positive self esteem. ?     ?Participation Quality:  did not attend ?Shawn Schaefer ? ? ?

## 2021-10-20 ENCOUNTER — Encounter (HOSPITAL_COMMUNITY): Payer: Self-pay

## 2021-10-20 NOTE — Progress Notes (Signed)
?   10/20/21 0215  ?Psych Admission Type (Psych Patients Only)  ?Admission Status Involuntary  ?Psychosocial Assessment  ?Patient Complaints None  ?Eye Contact Fair  ?Facial Expression Anxious  ?Affect Appropriate to circumstance  ?Speech Logical/coherent  ?Interaction Minimal  ?Motor Activity Other (Comment) ?(WDL)  ?Appearance/Hygiene Unremarkable  ?Behavior Characteristics Cooperative  ?Mood Pleasant  ?Thought Process  ?Coherency WDL  ?Content WDL  ?Delusions None reported or observed  ?Perception WDL  ?Hallucination None reported or observed  ?Judgment Impaired  ?Confusion None  ?Danger to Self  ?Current suicidal ideation? Denies  ?Agreement Not to Harm Self Yes  ?Description of Agreement Verbal contract  ?Danger to Others  ?Danger to Others None reported or observed  ? ?D: Patient in dayroom reports he had a good day and tolerating medication well. ?A: Medications administered as prescribed. Support and encouragement provided as needed.  ?R: Patient remains safe on the unit. Will continue to monitor for safety and stability.   ?

## 2021-10-20 NOTE — Group Note (Signed)
Recreation Therapy Group Note ? ? ?Group Topic:Stress Management  ?Group Date: 10/20/2021 ?Start Time: 0930 ?End Time: 1000 ?Facilitators: Victorino Sparrow, LRT,CTRS ?Location: [place] ? ? ?Goal Area(s) Addresses:  ?Patient will actively participate in stress management techniques presented during session.  ?Patient will successfully identify benefit of practicing stress management post d/c.  ? ?Group Description: Guided Imagery. LRT provided education, instruction, and demonstration on practice of visualization via guided imagery. Patient was asked to participate in the technique introduced during session. LRT debriefed including topics of mindfulness, stress management and specific scenarios each patient could use these techniques. Patients were given suggestions of ways to access scripts post d/c and encouraged to explore Youtube and other apps available on smartphones, tablets, and computers. ? ? ?Affect/Mood: N/A ?  ?Participation Level: N/A ?  ? ?Clinical Observations/Individualized Feedback:  Group did not occur due to orientation group starting extremely late, running over and using up group time.  ? ? ?Plan: Continue to engage patient in RT group sessions 2-3x/week. ? ? ?Victorino Sparrow, LRT,CTRS ?10/20/2021 2:26 PM ?

## 2021-10-20 NOTE — BH IP Treatment Plan (Signed)
Interdisciplinary Treatment and Diagnostic Plan Update ? ?10/20/2021 ?Time of Session: 9:50am ?[name] ?MRN: [id number] ? ?Principal Diagnosis: Bipolar 1 disorder, depressed, severe (HCC) ? ?Secondary Diagnoses: Principal Problem: ?  Bipolar 1 disorder, depressed, severe (HCC) ?Active Problems: ?  GAD (generalized anxiety disorder) ?  Alcohol use disorder, severe, dependence (HCC) ?  Tobacco use disorder ? ? ?Current Medications:  ?Current Facility-Administered Medications  ?Medication Dose Route Frequency Provider Last Rate Last Admin  ? acetaminophen (TYLENOL) tablet 650 mg  650 mg Oral Q6H PRN Jackelyn Poling, NP      ? alum & mag hydroxide-simeth (MAALOX/MYLANTA) 200-200-20 MG/5ML suspension 30 mL  30 mL Oral Q4H PRN Nira Conn A, NP   30 mL at 10/17/21 2209  ? ARIPiprazole (ABILIFY) tablet 10 mg  10 mg Oral QHS Massengill, Harrold Donath, MD   10 mg at 10/19/21 2113  ? cloNIDine (CATAPRES) tablet 0.1 mg  0.1 mg Oral BID PRN Phineas Inches, MD   0.1 mg at 10/15/21 1607  ? feeding supplement (ENSURE ENLIVE / ENSURE PLUS) liquid 237 mL  237 mL Oral BID BM Massengill, Harrold Donath, MD   237 mL at 10/18/21 1426  ? FLUoxetine (PROZAC) capsule 20 mg  20 mg Oral Daily Massengill, Nathan, MD   20 mg at 10/20/21 0803  ? lisinopril (ZESTRIL) tablet 10 mg  10 mg Oral Daily Massengill, Nathan, MD   10 mg at 10/20/21 3710  ? magnesium hydroxide (MILK OF MAGNESIA) suspension 30 mL  30 mL Oral Daily PRN Nira Conn A, NP   30 mL at 10/19/21 1407  ? multivitamin with minerals tablet 1 tablet  1 tablet Oral Daily Massengill, Nathan, MD   1 tablet at 10/20/21 6269  ? nicotine polacrilex (NICORETTE) gum 2 mg  2 mg Oral PRN Comer Locket, MD   2 mg at 10/20/21 0802  ? thiamine (B-1) injection 100 mg  100 mg Intramuscular Once Massengill, Harrold Donath, MD      ? thiamine tablet 100 mg  100 mg Oral Daily Massengill, Nathan, MD   100 mg at 10/20/21 4854  ? topiramate (TOPAMAX) tablet 25 mg  25 mg Oral Q12H Massengill, Harrold Donath, MD   25 mg at  10/20/21 0803  ? traZODone (DESYREL) tablet 50 mg  50 mg Oral QHS PRN Phineas Inches, MD   50 mg at 10/18/21 2124  ? ?PTA Medications: ?Medications Prior to Admission  ?Medication Sig Dispense Refill Last Dose  ? divalproex (DEPAKOTE ER) 500 MG 24 hr tablet Take 1,000 mg by mouth at bedtime.     ? FLUoxetine (PROZAC) 20 MG capsule Take 20 mg by mouth at bedtime.     ? ? ?Patient Stressors: Financial difficulties   ?Marital or family conflict   ?Occupational concerns   ?Substance abuse   ? ?Patient Strengths: Capable of independent living  ?Financial means  ?Work skills  ? ?Treatment Modalities: Medication Management, Group therapy, Case management,  ?1 to 1 session with clinician, Psychoeducation, Recreational therapy. ? ? ?Physician Treatment Plan for Primary Diagnosis: Bipolar 1 disorder, depressed, severe (HCC) ?Long Term Goal(s): Improvement in symptoms so as ready for discharge  ? ?Short Term Goals: Ability to identify changes in lifestyle to reduce recurrence of condition will improve ?Ability to verbalize feelings will improve ?Ability to disclose and discuss suicidal ideas ?Ability to demonstrate self-control will improve ?Ability to identify and develop effective coping behaviors will improve ?Ability to maintain clinical measurements within normal limits will improve ?Compliance with prescribed medications will  improve ?Ability to identify triggers associated with substance abuse/mental health issues will improve ? ?Medication Management: Evaluate patient's response, side effects, and tolerance of medication regimen. ? ?Therapeutic Interventions: 1 to 1 sessions, Unit Group sessions and Medication administration. ? ?Evaluation of Outcomes: Progressing ? ?Physician Treatment Plan for Secondary Diagnosis: Principal Problem: ?  Bipolar 1 disorder, depressed, severe (HCC) ?Active Problems: ?  GAD (generalized anxiety disorder) ?  Alcohol use disorder, severe, dependence (HCC) ?  Tobacco use  disorder ? ?Long Term Goal(s): Improvement in symptoms so as ready for discharge  ? ?Short Term Goals: Ability to identify changes in lifestyle to reduce recurrence of condition will improve ?Ability to verbalize feelings will improve ?Ability to disclose and discuss suicidal ideas ?Ability to demonstrate self-control will improve ?Ability to identify and develop effective coping behaviors will improve ?Ability to maintain clinical measurements within normal limits will improve ?Compliance with prescribed medications will improve ?Ability to identify triggers associated with substance abuse/mental health issues will improve    ? ?Medication Management: Evaluate patient's response, side effects, and tolerance of medication regimen. ? ?Therapeutic Interventions: 1 to 1 sessions, Unit Group sessions and Medication administration. ? ?Evaluation of Outcomes: Progressing ? ? ?RN Treatment Plan for Primary Diagnosis: Bipolar 1 disorder, depressed, severe (HCC) ?Long Term Goal(s): Knowledge of disease and therapeutic regimen to maintain health will improve ? ?Short Term Goals: Ability to remain free from injury will improve, Ability to verbalize frustration and anger appropriately will improve, Ability to demonstrate self-control, Ability to identify and develop effective coping behaviors will improve, and Compliance with prescribed medications will improve ? ?Medication Management: RN will administer medications as ordered by provider, will assess and evaluate patient's response and provide education to patient for prescribed medication. RN will report any adverse and/or side effects to prescribing provider. ? ?Therapeutic Interventions: 1 on 1 counseling sessions, Psychoeducation, Medication administration, Evaluate responses to treatment, Monitor vital signs and CBGs as ordered, Perform/monitor CIWA, COWS, AIMS and Fall Risk screenings as ordered, Perform wound care treatments as ordered. ? ?Evaluation of Outcomes:  Progressing ? ? ?LCSW Treatment Plan for Primary Diagnosis: Bipolar 1 disorder, depressed, severe (HCC) ?Long Term Goal(s): Safe transition to appropriate next level of care at discharge, Engage patient in therapeutic group addressing interpersonal concerns. ? ?Short Term Goals: Engage patient in aftercare planning with referrals and resources, Increase social support, Increase ability to appropriately verbalize feelings, Increase emotional regulation, Identify triggers associated with mental health/substance abuse issues, and Increase skills for wellness and recovery ? ?Therapeutic Interventions: Assess for all discharge needs, 1 to 1 time with Child psychotherapistocial worker, Explore available resources and support systems, Assess for adequacy in community support network, Educate family and significant other(s) on suicide prevention, Complete Psychosocial Assessment, Interpersonal group therapy. ? ?Evaluation of Outcomes: Progressing ? ? ?Progress in Treatment: ?Attending groups: Yes. ?Participating in groups: Yes. ?Taking medication as prescribed: Yes. ?Toleration medication: Yes. ?Family/Significant other contact made: Yes, individual(s) contacted:  mother ?Patient understands diagnosis: Yes. ?Discussing patient identified problems/goals with staff: Yes. ?Medical problems stabilized or resolved: Yes. ?Denies suicidal/homicidal ideation: Yes. ?Issues/concerns per patient self-inventory: No. ? ?New problem(s) identified: No, Describe:  none ? ?New Short Term/Long Term Goal(s): detox, medication management for mood stabilization; elimination of SI thoughts; development of comprehensive mental wellness/sobriety plan ? ?Patient Goals:  "To go home"  ? ?Discharge Plan or Barriers: Patient is to return home with mother and is to continue medication management and therapy with established providers  ? ?Reason for Continuation of Hospitalization: Depression ?  Medication stabilization ? ?Estimated Length of Stay: 1-3 days ? ?Last 3  [place] Suicide Severity Risk Score: ?Flowsheet Row [facility]13[facility]2021 in [facility]  ?C-SSRS RISK CATEGORY [place]

## 2021-10-20 NOTE — BHH Group Notes (Signed)
Spiritual care group on grief and loss facilitated by chaplain Janne Napoleon, [facility]  ? ?Group Goal:  ? ?Support / Education around grief and loss  ? ?Members engage in facilitated group support and psycho-social education.  ? ?Group Description:  ? ?Following introductions and group rules, group members engaged in facilitated group dialog and support around topic of loss, with particular support around experiences of loss in their lives. Group Identified types of loss (relationships / self / things) and identified patterns, circumstances, and changes that precipitate losses. Reflected on thoughts / feelings around loss, normalized grief responses, and recognized variety in grief experience. Group noted Worden's four tasks of grief in discussion.  ? ?Group drew on Adlerian / Rogerian, narrative, MI,  ? ?Patient Progress: Shawn Schaefer attended group and actively engaged in the conversation.  He was supportive of peers. ? ?[employer], [facility] ?Pager, [phone] ? ?

## 2021-10-20 NOTE — Group Note (Signed)
LCSW Group Therapy Note ? ? ?Group Date: 10/20/2021 ?Start Time: 1300 ?End Time: 1400 ? ?Type of Therapy and Topic:  Group Therapy: Thoughts, Feelings, and Actions ? ?Participation Level:  Active ? ? ?Description of Group:   ?In this group, each patient discussed their previous experiencing and understanding of overthinking, identifying the harmful impact on their lives. As a group, each patient was introduced to the basic concepts of Cognitive Behavioral Therapy: that thoughts, feelings, and actions are all connected and influence one another. They were given examples of how overthinking can affect our feelings, actions, and vise versa. The group was then asked to analyze how overthinking was harmful and brainstorm alternative thinking patterns/reactions to the example situation. Then, each group member filled out and identified their own example situation in which a problem situation caused their thoughts, feelings, and actions to be negatively impacted; they were asked to come up with 3 new (more adaptive/positive) thoughts that led to 3 new feelings and actions. ? ?Therapeutic Goals: ?Patients will review and discuss their past experience with overthinking. ?Patients will learn the basics of the CBT model through group-led examples.Marland Kitchen ?Patients will identify situations where they may have negative thoughts, feelings, or actions and will then reframe the situation using more positive thoughts to react differently. ? ?Summary of Patient Progress:  The patient shared that they feel confused today. Patient contributed to the discussion of how thoughts, feelings, and actions interact, noting when they may have experienced a negative thought pattern and recognized it as harmful. They were attentive when other patients shared their experiences, and worked to reframe their own thoughts in an activity to identify future situations where they may typically overthink. ? ?Therapeutic Modalities:   ?Cognitive Behavioral  Therapy ?Mindfulness ? ?Aram Beecham, LCSWA ?10/20/2021  2:13 PM   ? ?

## 2021-10-20 NOTE — BHH Counselor (Signed)
CSW spoke with Randol Kern, LCSW (therapist in [place]) who states that he can no longer see his client virtually and feels that "the acuity is to high".  He states that he would rather his client have an appointment in the [place] area.  CSW will get the Pt an appointment with [facility] in [place] for therapy.  ?

## 2021-10-20 NOTE — Group Note (Signed)
Occupational Therapy Group Note ? ?Group Topic:Other  ?Group Date: 10/20/2021 ?Start Time: 1400 ?End Time: 1500 ?Facilitators: Ted Mcalpine, OT  ? ? ?Routines are sets of habitual activities or practices that we engage in on a regular basis. They can be as simple as a morning cup of coffee or as complex as a daily workout routine. In this essay, we will explore the power of routines in promoting mental health and wellbeing, the reasons why they are important, and how to identify areas of our lives where routines can help improve our overall mental health. Wellbeing The power of routines lies in their ability to provide structure, stability, and predictability in our lives. They help [place] to establish healthy habits and reduce stress and anxiety by minimizing decision fatigue. They also help [place] to manage our time effectively and achieve our goals, which can boost our sense of self-efficacy and confidence. Routines can also foster a sense of community and social connectedness by providing opportunities to engage in shared activities with others. , routines can be a powerful tool in promoting mental health and wellbeing. By providing structure, stability, and predictability in our lives, routines can help [place] to establish healthy habits, reduce stress and anxiety, manage our time effectively, achieve our goals, and foster a sense of community and social connectedness. By identifying areas of our lives where routines can improve our mental health and wellbeing, and by following tips for establishing and maintaining healthy routines, we can harness the power of routines to improve our overall quality of life. ? ? ? ? ?Participation Level: Engaged ?  ?Participation Quality: Independent ?  ?Behavior: Appropriate ?  ?Speech/Thought Process: Organized ?  ?Affect/Mood: Appropriate ?  ?Insight: Good ?  ?Judgement: Good ?  ?Individualization: Pt was engaged and active in their participation of group discussion/activity. New  routines were identified  ?Modes of Intervention: Discussion and Education  ?Patient Response to Interventions:  Attentive and Receptive ?  ?Plan: Continue to engage patient in OT groups 2 - 3x/week. ? ?10/20/2021  ?Ted Mcalpine, OT ? ?Kerrin Champagne, OT ? ? ? ? ?

## 2021-10-20 NOTE — Progress Notes (Addendum)
[facility] ? ?10/20/2021 4:13 PM ?Shawn BucklerJames B Schaefer  ?MRN:  [id number] ? ?Chief complaint: worsening depression and SI ? ?Reason for admission: Patient is a 41 year old male with a psychiatric history of bipolar disorder who was admitted to the psychiatric unit for worsening depression and suicidal thoughts. [facility].  ? ?Overnight Events: NAEON. PRNs: Maalox x 1, nicorette gum ? ? ?Daily Notes 10/20/21: Patient seen, assessed, and discussed with attending, Dr. Sherron FlemingsMassengill. Today, patient reports feeling much better, as though he has found familial-like support in the milieu. He says that he feels ready for discharge. He reports that depressed mood is much improved and feels less sad, less isolated, and more motivated. We have an open conversation about how prior to admission, patient attempted suicide by cop, and he acknowledged that he told officers that he planned to say anything to be discharged and have the chance to commit suicide. He acknowledges that he did make that statement but assures our team that he no longer feels that way. He is future oriented and is worried about losing his job. He states that if he does lose his job, he needs to find another job asap. He advises that if he were having thoughts of hurting himself, he would talk to his mom or someone else from his support system. He denies having access to guns at his mother's home. He also denies thoughts or plans to harm anyone else.  ? ?As well, he denies somatic sx today, including tremors or any withdrawal sx. He reports sleeping and eating well. ? ?Called mom- Amanda CockayneLinda Stanley [phone]): No answer, HIPAA-compliant VM left.  ?Called back 1627: Conversations have been good and she is able to see a difference. Anxious to get out for fear of losing his job. She is comfortable with him returning home. Really good conversations about how things will be different: patient getting back to work, mending  relationships with children. Denies firearms are in the home, previous weapons of patient's have been put away. Given information about 911, 988, and [facility].  ? ?Principal Problem: Bipolar 1 disorder, depressed, severe (HCC) ? ?Diagnosis: Principal Problem: ?  Bipolar 1 disorder, depressed, severe (HCC) ?Active Problems: ?  GAD (generalized anxiety disorder) ?  Alcohol use disorder, severe, dependence (HCC) ?  Tobacco use disorder ? ?Total Time spent with patient: 30 minutes ? ?Past Psychiatric History:   Patient reports being diagnosed with bipolar in the past.  Patient reports that he is currently taking Prozac and Depakote, last dose 4 to 5 days ago due to starting drinking. ?Patient reports being psychiatrically hospitalized about 4 times ?Patient reports 1 suicide attempt in 2013 by hanging ?Patient reports "I did not respond to the psychiatric medications".  Per chart reviewed these include, Depakote Prozac lithium Remeron and Cymbalta Adderall Abilify Wellbutrin. ?  ? ?Past Medical History:  ?Past Medical History:  ?Diagnosis Date  ? Anxiety   ? Depression   ? Migraines   ? Suicide attempt [facility](HCC)   ? History reviewed. No pertinent surgical history. ? ?Family History: History reviewed. No pertinent family history. ? ?Family Psychiatric  History: None reported ? ?Social History:  ?Social History  ? ?Substance and Sexual Activity  ?Alcohol Use Yes  ? Alcohol/week: 12.0 standard drinks  ? Types: 12 Cans of beer per week  ? Comment: binge drinking on weekends  ?   ?Social History  ? ?Substance and Sexual Activity  ?Drug Use Yes  ? Frequency: 1.0  times per week  ? Types: Marijuana  ?  ?Social History  ? ?Socioeconomic History  ? Marital status: Married  ?  Spouse name: Not on file  ? Number of children: Not on file  ? Years of education: Not on file  ? Highest education level: Not on file  ?Occupational History  ? Not on file  ?Tobacco Use  ? Smoking status: Former  ?  Types: Cigarettes  ? Smokeless tobacco: Current   ?  Types: Chew  ?Substance and Sexual Activity  ? Alcohol use: Yes  ?  Alcohol/week: 12.0 standard drinks  ?  Types: 12 Cans of beer per week  ?  Comment: binge drinking on weekends  ? Drug use: Yes  ?  Frequency: 1.0 times per week  ?  Types: Marijuana  ? Sexual activity: Not Currently  ?  Birth control/protection: None  ?Other Topics Concern  ? Not on file  ?Social History Narrative  ? Not on file  ? ?Social Determinants of Health  ? ?Financial Resource Strain: Not on file  ?Food Insecurity: Not on file  ?Transportation Needs: Not on file  ?Physical Activity: Not on file  ?Stress: Not on file  ?Social Connections: Not on file  ? ?Additional Social History:  ?  ?Sleep: Good ? ?Appetite:  Good ? ?Current Medications: ?Current Facility-Administered Medications  ?Medication Dose Route Frequency Provider Last Rate Last Admin  ? acetaminophen (TYLENOL) tablet 650 mg  650 mg Oral Q6H PRN Jackelyn Poling, NP      ? alum & mag hydroxide-simeth (MAALOX/MYLANTA) 200-200-20 MG/5ML suspension 30 mL  30 mL Oral Q4H PRN Nira Conn A, NP   30 mL at 10/17/21 2209  ? ARIPiprazole (ABILIFY) tablet 10 mg  10 mg Oral QHS Anaijah Augsburger, Harrold Donath, MD   10 mg at 10/19/21 2113  ? cloNIDine (CATAPRES) tablet 0.1 mg  0.1 mg Oral BID PRN Phineas Inches, MD   0.1 mg at 10/15/21 4503  ? feeding supplement (ENSURE ENLIVE / ENSURE PLUS) liquid 237 mL  237 mL Oral BID BM Amol Domanski, Harrold Donath, MD   237 mL at 10/20/21 1006  ? FLUoxetine (PROZAC) capsule 20 mg  20 mg Oral Daily Thailan Sava, MD   20 mg at 10/20/21 0803  ? lisinopril (ZESTRIL) tablet 10 mg  10 mg Oral Daily Daxton Nydam, MD   10 mg at 10/20/21 8882  ? magnesium hydroxide (MILK OF MAGNESIA) suspension 30 mL  30 mL Oral Daily PRN Nira Conn A, NP   30 mL at 10/19/21 1407  ? multivitamin with minerals tablet 1 tablet  1 tablet Oral Daily Quentez Lober, MD   1 tablet at 10/20/21 8003  ? nicotine polacrilex (NICORETTE) gum 2 mg  2 mg Oral PRN Comer Locket, MD   2 mg  at 10/20/21 0802  ? thiamine (B-1) injection 100 mg  100 mg Intramuscular Once Karolyna Bianchini, Harrold Donath, MD      ? thiamine tablet 100 mg  100 mg Oral Daily Fairley Copher, MD   100 mg at 10/20/21 4917  ? topiramate (TOPAMAX) tablet 25 mg  25 mg Oral Q12H Demica Zook, Harrold Donath, MD   25 mg at 10/20/21 0803  ? traZODone (DESYREL) tablet 50 mg  50 mg Oral QHS PRN Phineas Inches, MD   50 mg at 10/18/21 2124  ? ? ?Lab Results:  ?No results found for this or any previous visit (from the past 48 hour(s)). ? ? ?Blood Alcohol level:  ?Lab Results  ?Component Value Date  ?  ETH <10 10/13/2021  ? ETH <5 02/27/2015  ? ? ?Metabolic Disorder Labs: ?Lab Results  ?Component Value Date  ? HGBA1C 5.2 10/17/2021  ? MPG 102.54 10/17/2021  ? ?No results found for: PROLACTIN ?Lab Results  ?Component Value Date  ? CHOL 181 10/17/2021  ? TRIG 245 (H) 10/17/2021  ? HDL 55 10/17/2021  ? CHOLHDL 3.3 10/17/2021  ? VLDL 49 (H) 10/17/2021  ? LDLCALC 77 10/17/2021  ? ? ?Physical Findings: ?AIMS: Facial and Oral Movements ?Muscles of Facial Expression: None, normal ?Lips and Perioral Area: None, normal ?Jaw: None, normal ?Tongue: None, normal,Extremity Movements ?Upper (arms, wrists, hands, fingers): None, normal ?Lower (legs, knees, ankles, toes): None, normal, Trunk Movements ?Neck, shoulders, hips: None, normal, Overall Severity ?Severity of abnormal movements (highest score from questions above): None, normal ?Incapacitation due to abnormal movements: None, normal ?Patient's awareness of abnormal movements (rate only patient's report): No Awareness, Dental Status ?Current problems with teeth and/or dentures?: No ?Does patient usually wear dentures?: No  ?CIWA:  CIWA-Ar Total: 0 ? ? ?Musculoskeletal: ?Strength & Muscle Tone: within normal limits ?Gait & Station: normal ?Patient leans: N/A ? ?Psychiatric Specialty Exam: ? ?Presentation  ?General Appearance: Appropriate for Environment; Casual; Fairly Groomed ? ?Eye Contact:Good ? ?Speech:Clear and  Coherent; Normal Rate ? ?Speech Volume:Normal ? ?Handedness:Right ? ?Mood and Affect  ?Mood:Euthymic ? ?Affect:Appropriate; Congruent ? ?Thought Process  ?Thought Processes:Coherent; Goal Directed ? ?Description

## 2021-10-21 MED ORDER — NICOTINE POLACRILEX 2 MG MT GUM: 2.0000 mg | CHEWING_GUM | OROMUCOSAL | 0 refills | Status: AC | PRN

## 2021-10-21 MED ORDER — TRAZODONE HCL 50 MG PO TABS: 50.0000 mg | ORAL_TABLET | Freq: Every evening | ORAL | 0 refills | Status: DC | PRN

## 2021-10-21 MED ORDER — FLUOXETINE HCL 20 MG PO CAPS: 20.0000 mg | ORAL_CAPSULE | Freq: Every day | ORAL | 0 refills | Status: DC

## 2021-10-21 MED ORDER — TOPIRAMATE 25 MG PO TABS: 25.0000 mg | ORAL_TABLET | Freq: Two times a day (BID) | ORAL | 0 refills | Status: DC

## 2021-10-21 MED ORDER — ARIPIPRAZOLE 10 MG PO TABS: 10.0000 mg | ORAL_TABLET | Freq: Every day | ORAL | 0 refills | Status: DC

## 2021-10-21 MED ORDER — LISINOPRIL 10 MG PO TABS: 10.0000 mg | ORAL_TABLET | Freq: Every day | ORAL | 0 refills | Status: DC

## 2021-10-21 NOTE — Progress Notes (Signed)
?  [facility] Adult Case Management Discharge Plan : ? ?Will you be returning to the same living situation after discharge:  Yes,  Home with mother  ?At discharge, do you have transportation home?: Yes,  Mother  ?Do you have the ability to pay for your medications: Yes,  Private Insurance/BCBS ? ?Release of information consent forms completed and in the chart;  Patient's signature needed at discharge. ? ?Patient to Follow up at: ? Follow-up Information   ? ? [facility] Follow up on 10/29/2021.   ?Why: You are scheduled for a telehealth medication management appointment on 10/29/2021 at 10:30am with Leonard Schwartz. ?Contact information: ?Address: [address] [place], [place] [address] ? ?Phone: [phone] ?Fax: [phone] ? ?  ?  ? ? [employer] Go on 10/24/2021.   ?Why: You have a hospital follow up appointment on 10/24/21 at 10:00 am.  This appointment will be held in person.  Following this appointment you will be scheduled for a clinical assessment to obtain therapy and/or medication management services with this provider. ?Contact information: ?[address] ?[place] [address] ?[phone] ? ? ?  ?  ? ?  ?  ? ?  ? ? ?Next level of care provider has access to [facility] Link:no ? ?Safety Planning and Suicide Prevention discussed: Yes,  with patient and mother  ? ?  ? ?Has patient been referred to the Quitline?: Patient refused referral ? ?Patient has been referred for addiction treatment: Pt. refused referral ? ?Aram Beecham, LCSWA ?10/21/2021, 9:37 AM ?

## 2021-10-21 NOTE — Progress Notes (Signed)
Patient presents with anxious mood, affect blunted. Shawn Schaefer states that he is doing much better and that he is looking forward to discharge. Patient denies any SI/HI or A/V Hallucinations. Patient states he slept well and feels so much better on his medications. Order received for patient discharge. Patient AVS reviewed and crisis services reviewed at length. NO signs of acute decompensation. All belongings returned and patient escorted from unit to the lobby to care of mother.  ?
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
## 2021-10-21 NOTE — Discharge Instructions (Signed)
-  Follow-up with your outpatient psychiatric provider -instructions on appointment date, time, and address (location) are provided to you in discharge paperwork.  -Take your psychiatric medications as prescribed at discharge - instructions are provided to you in the discharge paperwork  -Follow-up with outpatient primary care doctor and other specialists -for management of chronic medical disease.  -Recommend abstinence from alcohol, tobacco, and other illicit drug use at discharge.   -If your psychiatric symptoms recur, worsen, or if you have side effects to your psychiatric medications, call your outpatient psychiatric provider, 911, 988 or go to the nearest emergency department.  -If suicidal thoughts recur, call your outpatient psychiatric provider, 911, 988 or go to the nearest emergency department.  

## 2021-10-21 NOTE — Progress Notes (Signed)
?   10/21/21 0108  ?Psych Admission Type (Psych Patients Only)  ?Admission Status Involuntary  ?Psychosocial Assessment  ?Patient Complaints None  ?Eye Contact Fair  ?Facial Expression Anxious  ?Affect Appropriate to circumstance  ?Speech Logical/coherent  ?Interaction Guarded  ?Motor Activity Other (Comment) ?(wdl)  ?Appearance/Hygiene Unremarkable  ?Behavior Characteristics Cooperative  ?Mood Pleasant  ?Thought Process  ?Coherency WDL  ?Content WDL  ?Delusions None reported or observed  ?Perception WDL  ?Hallucination None reported or observed  ?Judgment WDL  ?Confusion None  ?Danger to Self  ?Current suicidal ideation? Denies  ?Agreement Not to Harm Self Yes  ?Description of Agreement verbally contract for safety  ?Danger to Others  ?Danger to Others None reported or observed  ? ?D: Patient in dayroom reports he had a good day. Pt reports he is tolerating medication well and looking forward to discharge. ?A: Medications administered as prescribed. Support and encouragement provided as needed.  ?R: Patient remains safe on the unit. Will continue to monitor for safety and stability.   ?

## 2021-10-21 NOTE — [facility] Group Notes (Signed)
Adult Psychoeducational Group Note ? ?Date:  10/21/2021 ?Time:  10:17 AM ? ?Group Topic/Focus:  ?Orientation:   The focus of this group is to educate the patient on the purpose and policies of crisis stabilization and provide a format to answer questions about their admission.  The group details unit policies and expectations of patients while admitted. ? ?Participation Level:  Active ? ?Participation Quality:  Appropriate ? ?Affect:  Appropriate ? ?Cognitive:  Appropriate ? ?Insight: Appropriate ? ?Engagement in Group:  Engaged ? ?Modes of Intervention:  Education ? ?Additional Comments:  Pt participated in group ? ?Thomas Hoff ?10/21/2021, 10:17 AM ?
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
## 2022-02-08 ENCOUNTER — Encounter (HOSPITAL_BASED_OUTPATIENT_CLINIC_OR_DEPARTMENT_OTHER): Payer: Self-pay | Admitting: Emergency Medicine

## 2022-02-08 ENCOUNTER — Emergency Department (HOSPITAL_BASED_OUTPATIENT_CLINIC_OR_DEPARTMENT_OTHER): Payer: BC Managed Care – PPO

## 2022-02-08 ENCOUNTER — Emergency Department (HOSPITAL_BASED_OUTPATIENT_CLINIC_OR_DEPARTMENT_OTHER)
Admission: EM | Admit: 2022-02-08 | Discharge: 2022-02-08 | Disposition: A | Discharge status: Home / Self Care | Payer: BC Managed Care – PPO | Attending: Emergency Medicine | Admitting: Emergency Medicine

## 2022-02-08 DIAGNOSIS — X501XXA Overexertion from prolonged static or awkward postures, initial encounter: Secondary | ICD-10-CM | POA: Insufficient documentation

## 2022-02-08 DIAGNOSIS — S8992XA Unspecified injury of left lower leg, initial encounter: Secondary | ICD-10-CM | POA: Diagnosis present

## 2022-02-08 DIAGNOSIS — S8392XA Sprain of unspecified site of left knee, initial encounter: Secondary | ICD-10-CM | POA: Diagnosis not present

## 2022-02-08 DIAGNOSIS — M25462 Effusion, left knee: Secondary | ICD-10-CM

## 2022-02-08 NOTE — Discharge Instructions (Addendum)
X-ray shows no fracture or malalignment.  Overall suspect a sprain with knee swelling and effusion.  Recommend getting a knee compression sleeve.  Weight-bear gently with crutches.  Recommend ice, Tylenol, ibuprofen.  Follow-up with Dr. Carola Frost with orthopedics.

## 2022-02-08 NOTE — ED Triage Notes (Signed)
Pt reports he twisted his L leg yesterday and is having L knee pain. L knee swollen. Denies other injuries.

## 2022-02-08 NOTE — ED Provider Notes (Signed)
[facility] EMERGENCY DEPARTMENT Provider Note   CSN: [id number] Arrival date & time: 02/08/22  1906     History  Chief Complaint  Patient presents with   Knee Injury    Shawn Schaefer is a 41 y.o. male.  Patient here with left knee pain and swelling after twisting his left knee yesterday.  He has been ambulatory but with discomfort.  Has injured this knee in the past.  Nothing makes it worse or better.  Denies any fever, chills, weakness, numbness.  The history is provided by the patient.       Home Medications Prior to Admission medications   Medication Sig Start Date End Date Taking? Authorizing Provider  ARIPiprazole (ABILIFY) 10 MG tablet Take 1 tablet (10 mg total) by mouth at bedtime. 10/21/21 11/20/21  Massengill, Harrold Donath, MD  FLUoxetine (PROZAC) 20 MG capsule Take 1 capsule (20 mg total) by mouth daily. 10/22/21 11/21/21  Massengill, Harrold Donath, MD  lisinopril (ZESTRIL) 10 MG tablet Take 1 tablet (10 mg total) by mouth daily. 10/22/21 11/21/21  Massengill, Harrold Donath, MD  topiramate (TOPAMAX) 25 MG tablet Take 1 tablet (25 mg total) by mouth every 12 (twelve) hours. 10/21/21 11/20/21  Massengill, Harrold Donath, MD  traZODone (DESYREL) 50 MG tablet Take 1 tablet (50 mg total) by mouth at bedtime as needed for sleep. 10/21/21 11/20/21  Phineas Inches, MD      Allergies    Patient has no known allergies.    Review of Systems   Review of Systems  Physical Exam Updated Vital Signs BP (!) 164/106   Pulse 83   Temp 98.4 F (36.9 C) (Oral)   Resp 14   SpO2 98%  Physical Exam Constitutional:      General: He is not in acute distress. HENT:     Head: Normocephalic.  Cardiovascular:     Pulses: Normal pulses.  Musculoskeletal:        General: Swelling and tenderness present. No deformity. Normal range of motion.     Cervical back: Normal range of motion.     Comments: Tenderness and swelling to the left knee but good range of motion  Skin:    General: Skin is warm.     Capillary  Refill: Capillary refill takes less than 2 seconds.  Neurological:     General: No focal deficit present.     Mental Status: He is alert.     Sensory: No sensory deficit.     Motor: No weakness.     ED Results / Procedures / Treatments   Labs (all labs ordered are listed, but only abnormal results are displayed) Labs Reviewed - No data to display  EKG None  Radiology DG Knee Complete 4 Views Left  Result Date: 02/08/2022 CLINICAL DATA:  Trauma, pain and swelling EXAM: LEFT KNEE - COMPLETE 4+ VIEW COMPARISON:  None Available. FINDINGS: No fracture or dislocation is seen. There is soft tissue fullness in the suprapatellar bursa suggesting moderate to large effusion. IMPRESSION: No fracture or dislocation is seen in left knee. Moderate to large effusion is present in suprapatellar bursa in the left knee. Electronically Signed   By: Ernie Avena M.D.   On: 02/08/2022 19:50    Procedures Procedures    Medications Ordered in ED Medications - No data to display  ED Course/ Medical Decision Making/ A&P                           Medical  Decision Making Amount and/or Complexity of Data Reviewed Radiology: ordered.   Shawn Schaefer is here with left knee pain.  Normal vitals.  No fever.  Twisted his left knee yesterday and now swollen today and painful when he walks.  Has effusion and tenderness of the left knee on exam.  X-ray showed no fracture or malalignment.  There is effusion.  Overall suspect sprain/inflammatory process.  We will have him use crutches, knee compression, ice, Tylenol, ibuprofen and follow-up with orthopedics.  Discharged in good condition.  Understands return precautions.  Neurovascular neuromuscular intact.  This chart was dictated using voice recognition software.  Despite best efforts to proofread,  errors can occur which can change the documentation meaning.         Final Clinical Impression(s) / ED Diagnoses Final diagnoses:  Effusion of left  knee  Sprain of left knee, unspecified ligament, initial encounter    Rx / DC Orders ED Discharge Orders     None         Virgina Norfolk, DO 02/08/22 2006

## 2022-02-08 NOTE — ED Notes (Signed)
Pt refused crutches.

## 2022-09-10 ENCOUNTER — Ambulatory Visit (HOSPITAL_COMMUNITY)
Admission: EM | Admit: 2022-09-10 | Discharge: 2022-09-11 | Disposition: A | Discharge status: Home / Self Care | Payer: BC Managed Care – PPO | Attending: Psychiatry | Admitting: Psychiatry

## 2022-09-10 ENCOUNTER — Other Ambulatory Visit: Payer: Self-pay

## 2022-09-10 DIAGNOSIS — Z1152 Encounter for screening for COVID-19: Secondary | ICD-10-CM | POA: Diagnosis not present

## 2022-09-10 DIAGNOSIS — F319 Bipolar disorder, unspecified: Secondary | ICD-10-CM | POA: Diagnosis not present

## 2022-09-10 DIAGNOSIS — F69 Unspecified disorder of adult personality and behavior: Secondary | ICD-10-CM | POA: Diagnosis not present

## 2022-09-10 DIAGNOSIS — F339 Major depressive disorder, recurrent, unspecified: Secondary | ICD-10-CM | POA: Diagnosis not present

## 2022-09-10 DIAGNOSIS — R45851 Suicidal ideations: Secondary | ICD-10-CM | POA: Diagnosis present

## 2022-09-10 LAB — CBC WITH DIFFERENTIAL/PLATELET
Abs Immature Granulocytes: 0.06 10*3/uL (ref 0.00–0.07)
Basophils Absolute: 0.1 10*3/uL (ref 0.0–0.1)
Basophils Relative: 1 %
Eosinophils Absolute: 0.1 10*3/uL (ref 0.0–0.5)
Eosinophils Relative: 1 %
HCT: 47.5 % (ref 39.0–52.0)
Hemoglobin: 17 g/dL (ref 13.0–17.0)
Immature Granulocytes: 1 %
Lymphocytes Relative: 15 %
Lymphs Abs: 2 10*3/uL (ref 0.7–4.0)
MCH: 30.9 pg (ref 26.0–34.0)
MCHC: 35.8 g/dL (ref 30.0–36.0)
MCV: 86.2 fL (ref 80.0–100.0)
Monocytes Absolute: 1 10*3/uL (ref 0.1–1.0)
Monocytes Relative: 8 %
Neutro Abs: 10.1 10*3/uL — ABNORMAL HIGH (ref 1.7–7.7)
Neutrophils Relative %: 74 %
Platelets: 288 10*3/uL (ref 150–400)
RBC: 5.51 MIL/uL (ref 4.22–5.81)
RDW: 12.2 % (ref 11.5–15.5)
WBC: 13.3 10*3/uL — ABNORMAL HIGH (ref 4.0–10.5)
nRBC: 0 % (ref 0.0–0.2)

## 2022-09-10 LAB — POC SARS CORONAVIRUS 2 AG: SARSCOV2ONAVIRUS 2 AG: NEGATIVE

## 2022-09-10 MED ORDER — OLANZAPINE 5 MG PO TBDP
5.0000 mg | ORAL_TABLET | Freq: Three times a day (TID) | ORAL | Status: DC | PRN
Start: 2022-09-10 — End: 2022-09-11

## 2022-09-10 MED ORDER — ZIPRASIDONE MESYLATE 20 MG IM SOLR: 20.0000 mg | INTRAMUSCULAR | Status: DC | PRN

## 2022-09-10 MED ORDER — ACETAMINOPHEN 325 MG PO TABS
650.0000 mg | ORAL_TABLET | Freq: Four times a day (QID) | ORAL | Status: DC | PRN
  Administered 2022-09-11: 650 mg via ORAL
  Filled 2022-09-10: qty 2

## 2022-09-10 MED ORDER — LORAZEPAM 1 MG PO TABS: 1.0000 mg | ORAL_TABLET | ORAL | Status: DC | PRN

## 2022-09-10 MED ORDER — MAGNESIUM HYDROXIDE 400 MG/5ML PO SUSP: 30.0000 mL | Freq: Every day | ORAL | Status: DC | PRN

## 2022-09-10 MED ORDER — ALUM & MAG HYDROXIDE-SIMETH 200-200-20 MG/5ML PO SUSP: 30.0000 mL | ORAL | Status: DC | PRN

## 2022-09-10 NOTE — ED Provider Notes (Signed)
[facility] Continuous Assessment Admission H&P  Date: 09/11/22 Patient Name: Shawn Schaefer MRN: [id number] Chief Complaint: increase anger,  and suicidal thoughts   Diagnoses:  Final diagnoses:  Suicidal ideation  Recurrent major depressive disorder, remission status unspecified ([place])  Behavior concern in adult    HPI: Shawn Schaefer 42 y/o male with a history of suicidal thoughts, bipolar disorder, major depressive disorder, attempted suicide.  Presented to [facility] accompanied by his mother.  Per the patient he has major anger management and increased suicidal thoughts when asked what is his plan patient states he has not think of a plan yet according to patient life is stressful problems with girlfriend patient also has upcoming court date for DUI.  According to patient he is currently not seeing a psychiatrist or therapist but he was seeing them 6 months ago.  According to patient he was prescribed Prozac but he has not been taking the medication.  According to patient he was hospitalized a year ago for suicide attempt.  Face-to-face observation of patient, patient is alert and oriented x 4, speech is clear, maintaining eye contact.  Patient mood is depressed affect is flat congruent with mood.  Patient described himself as feeling worthless.  Patient reports he stopped drinking alcohol one months ago, patient reports he last smoked marijuana 6 months ago.  According to patient he does not do any other illicit drug at this time.  According to patient he lives between staying with his mom and back and forth with his girlfriend.  According to patient he does work.  Patient denies having any guns in the home or access to a gun.  Recommend inpatient observation with further evaluation in the a.m.  Total Time spent with patient: 30 minutes  Musculoskeletal  Strength & Muscle Tone: within normal limits Gait & Station: normal Patient leans: N/A  Psychiatric Specialty Exam  Presentation General  Appearance:  Casual  Eye Contact: Good  Speech: Clear and Coherent  Speech Volume: Normal  Handedness: Right   Mood and Affect  Mood: Anxious; Depressed  Affect: Congruent   Thought Process  Thought Processes: Linear  Descriptions of Associations:Intact  Orientation:Full (Time, Place and Person)  Thought Content:WDL  Diagnosis of Schizophrenia or Schizoaffective disorder in past: No   Hallucinations:Hallucinations: None  Ideas of Reference:None  Suicidal Thoughts:Suicidal Thoughts: Yes, Passive SI Passive Intent and/or Plan: With Access to Means  Homicidal Thoughts:Homicidal Thoughts: No   Sensorium  Memory: Immediate Fair  Judgment: Poor  Insight: Fair   Materials engineer: Fair  Attention Span: Fair  Recall: Good  Fund of Knowledge: Good  Language: Good   Psychomotor Activity  Psychomotor Activity: Psychomotor Activity: Normal   Assets  Assets: Desire for Improvement   Sleep  Sleep: Sleep: Fair Number of Hours of Sleep: 8   Nutritional Assessment (For OBS and FBC admissions only) Has the patient had a weight loss or gain of 10 pounds or more in the last 3 months?: No Has the patient had a decrease in food intake/or appetite?: No Does the patient have dental problems?: No Does the patient have eating habits or behaviors that may be indicators of an eating disorder including binging or inducing vomiting?: No Has the patient recently lost weight without trying?: 0 Has the patient been eating poorly because of a decreased appetite?: 0 Malnutrition Screening Tool Score: 0    Physical Exam HENT:     Head: Normocephalic.     Nose: Nose normal.  Cardiovascular:  Rate and Rhythm: Normal rate.  Pulmonary:     Effort: Pulmonary effort is normal.  Musculoskeletal:        General: Normal range of motion.     Cervical back: Normal range of motion.  Neurological:     General: No focal deficit  present.     Mental Status: He is alert.  Psychiatric:        Mood and Affect: Mood normal.        Behavior: Behavior normal.        Thought Content: Thought content normal.        Judgment: Judgment normal.    Review of Systems  Constitutional: Negative.   HENT: Negative.    Eyes: Negative.   Respiratory: Negative.    Cardiovascular: Negative.   Gastrointestinal: Negative.   Genitourinary: Negative.   Musculoskeletal: Negative.   Skin: Negative.   Neurological: Negative.   Endo/Heme/Allergies: Negative.   Psychiatric/Behavioral:  Positive for depression and suicidal ideas. The patient is nervous/anxious.     Blood pressure (!) 166/107, pulse 99, temperature 98.5 F (36.9 C), temperature source Oral, resp. rate 18, SpO2 99 %. There is no height or weight on file to calculate BMI.  Past Psychiatric History: bipolar 1,  MDD, SI   Is the patient at risk to self? Yes  Has the patient been a risk to self in the past 6 months? Yes .    Has the patient been a risk to self within the distant past? Yes   Is the patient a risk to others? No   Has the patient been a risk to others in the past 6 months? Yes   Has the patient been a risk to others within the distant past? No   Past Medical History: see chart  Family History: unknown  Social History: tobacco   Last Labs:  Admission on 09/10/2022  Component Date Value Ref Range Status   SARS Coronavirus 2 by RT PCR 09/10/2022 NEGATIVE  NEGATIVE Final   Influenza A by PCR 09/10/2022 NEGATIVE  NEGATIVE Final   Influenza B by PCR 09/10/2022 NEGATIVE  NEGATIVE Final   Comment: (NOTE) The Xpert Xpress SARS-CoV-2/FLU/RSV plus assay is intended as an aid in the diagnosis of influenza from Nasopharyngeal swab specimens and should not be used as a sole basis for treatment. Nasal washings and aspirates are unacceptable for Xpert Xpress SARS-CoV-2/FLU/RSV testing.  Fact Sheet for  Patients: EntrepreneurPulse.com.au  Fact Sheet for Healthcare Providers: IncredibleEmployment.be  This test is not yet approved or cleared by the [place] FDA and has been authorized for detection and/or diagnosis of SARS-CoV-2 by FDA under an Emergency Use Authorization (EUA). This EUA will remain in effect (meaning this test can be used) for the duration of the COVID-19 declaration under Section 564(b)(1) of the Act, 21 U.S.C. section 360bbb-3(b)(1), unless the authorization is terminated or revoked.     Resp Syncytial Virus by PCR 09/10/2022 NEGATIVE  NEGATIVE Final   Comment: (NOTE) Fact Sheet for Patients: EntrepreneurPulse.com.au  Fact Sheet for Healthcare Providers: IncredibleEmployment.be  This test is not yet approved or cleared by the [place] FDA and has been authorized for detection and/or diagnosis of SARS-CoV-2 by FDA under an Emergency Use Authorization (EUA). This EUA will remain in effect (meaning this test can be used) for the duration of the COVID-19 declaration under Section 564(b)(1) of the Act, 21 U.S.C. section 360bbb-3(b)(1), unless the authorization is terminated or revoked.  Performed at [facility], [place]  [address] [place], [place] [address]    WBC 09/10/2022 13.3 (H)  4.0 - 10.5 K/uL Final   RBC 09/10/2022 5.51  4.22 - 5.81 MIL/uL Final   Hemoglobin 09/10/2022 17.0  13.0 - 17.0 g/dL Final   HCT 09/10/2022 47.5  39.0 - 52.0 % Final   MCV 09/10/2022 86.2  80.0 - 100.0 fL Final   MCH 09/10/2022 30.9  26.0 - 34.0 pg Final   MCHC 09/10/2022 35.8  30.0 - 36.0 g/dL Final   RDW 09/10/2022 12.2  11.5 - 15.5 % Final   Platelets 09/10/2022 288  150 - 400 K/uL Final   nRBC 09/10/2022 0.0  0.0 - 0.2 % Final   Neutrophils Relative % 09/10/2022 74  % Final   Neutro Abs 09/10/2022 10.1 (H)  1.7 - 7.7 K/uL Final   Lymphocytes Relative 09/10/2022 15  % Final   Lymphs Abs  09/10/2022 2.0  0.7 - 4.0 K/uL Final   Monocytes Relative 09/10/2022 8  % Final   Monocytes Absolute 09/10/2022 1.0  0.1 - 1.0 K/uL Final   Eosinophils Relative 09/10/2022 1  % Final   Eosinophils Absolute 09/10/2022 0.1  0.0 - 0.5 K/uL Final   Basophils Relative 09/10/2022 1  % Final   Basophils Absolute 09/10/2022 0.1  0.0 - 0.1 K/uL Final   Immature Granulocytes 09/10/2022 1  % Final   Abs Immature Granulocytes 09/10/2022 0.06  0.00 - 0.07 K/uL Final   Performed at [facility], [place] [address], [place], [place] [address]   Sodium 09/10/2022 140  135 - 145 mmol/L Final   Potassium 09/10/2022 3.7  3.5 - 5.1 mmol/L Final   Chloride 09/10/2022 104  98 - 111 mmol/L Final   CO2 09/10/2022 22  22 - 32 mmol/L Final   Glucose, Bld 09/10/2022 114 (H)  70 - 99 mg/dL Final   Glucose reference range applies only to samples taken after fasting for at least 8 hours.   BUN 09/10/2022 16  6 - 20 mg/dL Final   Creatinine, Ser 09/10/2022 0.91  0.61 - 1.24 mg/dL Final   Calcium 09/10/2022 9.4  8.9 - 10.3 mg/dL Final   Total Protein 09/10/2022 6.9  6.5 - 8.1 g/dL Final   Albumin 09/10/2022 4.4  3.5 - 5.0 g/dL Final   AST 09/10/2022 27  15 - 41 U/L Final   ALT 09/10/2022 30  0 - 44 U/L Final   Alkaline Phosphatase 09/10/2022 78  38 - 126 U/L Final   Total Bilirubin 09/10/2022 0.7  0.3 - 1.2 mg/dL Final   GFR, Estimated 09/10/2022 >60  >60 mL/min Final   Comment: (NOTE) Calculated using the CKD-EPI Creatinine Equation (2021)    Anion gap 09/10/2022 14  5 - 15 Final   Performed at [facility] [address] [place], [place] [address]   SARSCOV2ONAVIRUS 2 AG 09/10/2022 NEGATIVE  NEGATIVE Final   Comment: (NOTE) SARS-CoV-2 antigen NOT DETECTED.   Negative results are presumptive.  Negative results do not preclude SARS-CoV-2 infection and should not be used as the sole basis for treatment or other patient management decisions, including infection  control decisions, particularly in the  presence of clinical signs and  symptoms consistent with COVID-19, or in those who have been in contact with the virus.  Negative results must be combined with clinical observations, patient history, and epidemiological information. The expected result is Negative.  Fact Sheet for Patients: HandmadeRecipes.com.cy  Fact Sheet for Healthcare Providers: FuneralLife.at  This test is  not yet approved or cleared by the [place] and  has been authorized for detection and/or diagnosis of SARS-CoV-2 by FDA under an Emergency Use Authorization (EUA).  This EUA will remain in effect (meaning this test can be used) for the duration of  the COV                          ID-19 declaration under Section 564(b)(1) of the Act, 21 U.S.C. section 360bbb-3(b)(1), unless the authorization is terminated or revoked sooner.     Cholesterol 09/10/2022 192  0 - 200 mg/dL Final   Triglycerides 09/10/2022 281 (H)  <150 mg/dL Final   HDL 09/10/2022 42  >40 mg/dL Final   Total CHOL/HDL Ratio 09/10/2022 4.6  RATIO Final   VLDL 09/10/2022 56 (H)  0 - 40 mg/dL Final   LDL Cholesterol 09/10/2022 94  0 - 99 mg/dL Final   Comment:        Total Cholesterol/HDL:CHD Risk Coronary Heart Disease Risk Table                     Men   Women  1/2 Average Risk   3.4   3.3  Average Risk       5.0   4.4  2 X Average Risk   9.6   7.1  3 X Average Risk  23.4   11.0        Use the calculated Patient Ratio above and the CHD Risk Table to determine the patient's CHD Risk.        ATP III CLASSIFICATION (LDL):  <100     mg/dL   Optimal  100-129  mg/dL   Near or Above                    Optimal  130-159  mg/dL   Borderline  160-189  mg/dL   High  >190     mg/dL   Very High Performed at [facility] [address] [place], [place] [address]    TSH 09/10/2022 3.088  0.350 - 4.500 uIU/mL Final   Comment: Performed by a 3rd Generation assay with a functional  sensitivity of <=0.01 uIU/mL. Performed at [facility], [place] [address] [place], [place] [address]     Allergies: Patient has no known allergies.  Medications:  Facility Ordered Medications  Medication   acetaminophen (TYLENOL) tablet 650 mg   alum & mag hydroxide-simeth (MAALOX/MYLANTA) 200-200-20 MG/5ML suspension 30 mL   magnesium hydroxide (MILK OF MAGNESIA) suspension 30 mL   OLANZapine zydis (ZYPREXA) disintegrating tablet 5 mg   And   LORazepam (ATIVAN) tablet 1 mg   And   ziprasidone (GEODON) injection 20 mg    Medical Decision Making  Inpatient observation  Lab Orders         Resp panel by RT-PCR (RSV, Flu A&B, Covid) Anterior Nasal Swab         CBC with Differential/Platelet         Comprehensive metabolic panel         Hemoglobin A1c         Lipid panel         TSH         POCT Urine Drug Screen - (I-Screen)         POC SARS Coronavirus 2 Ag      Meds ordered this encounter  Medications   acetaminophen (TYLENOL)  tablet 650 mg   alum & mag hydroxide-simeth (MAALOX/MYLANTA) 200-200-20 MG/5ML suspension 30 mL   magnesium hydroxide (MILK OF MAGNESIA) suspension 30 mL   AND Linked Order Group    OLANZapine zydis (ZYPREXA) disintegrating tablet 5 mg    LORazepam (ATIVAN) tablet 1 mg    ziprasidone (GEODON) injection 20 mg       Recommendations  Based on my evaluation the patient appears to have an emergency medical condition for which I recommend the patient be transferred to the emergency department for further evaluation.  Evette Georges, NP 09/11/22  6:02 AM

## 2022-09-10 NOTE — BH Assessment (Signed)
Comprehensive Clinical Assessment (CCA) Note  09/10/2022 Shawn Schaefer [id number]  Disposition: Shawn Georges, NP recommends continuous assessment for safety, to be reassessed tomorrow.  The patient demonstrates the following risk factors for suicide: Chronic risk factors for suicide include: psychiatric disorder of Bipolar I . Acute risk factors for suicide include: family or marital conflict. Protective factors for this patient include: responsibility to others (children, family). Considering these factors, the overall suicide risk at this point appears to be high. Patient is not appropriate for outpatient follow up.  Shawn Schaefer is a 42 y.o. single male who presents to [facility] voluntarily, accompanied by his mother Shawn Schaefer [phone] due to suicidal ideation with no plan and anger issues. Patient states, "life has me down" and that he has been feeling this way the past week. He reports stressors are his relationship, legal trouble and not seeing his children. Pt repots a diagnosis of bipolar I and generalized anxiety disorder. Pt acknowledges depressive symptoms to include isolation, crying spells, hopelessness, irritability, and guilt. Pt denies any recent manic symptoms. Pt reports a suicide attempt by cop in 2023 and a suicide attempt by hanging in 2013. He was found Pt reports homicidal ideations earlier this week when he was angry, towards the father of his ex-girlfriend. Pt denies auditory or visual hallucinations. Pt denies current substance use. Pt denies access to guns or weapons.  Pt states he has been living between the home of his mother and girlfriend. He states he has a court date on September 14, 2022, for a DUI. Additionally, Pt says he has been unable to see his children, due to a 50B against him from their mother. Pt identifies his mother as his primary support. Pt denies any history of abuse or traumas.   Pt states he is prescribed Prozac by Rosina Lowenstein, NP, however he is  neglectful in taking it regularly. Patient has been hospitalized at [facility] 10/14/21-10/21/21, [place] and [facility]. Pt denies access to guns or weapons. Pt states he has made homicidal threats towards his ex-girlfriend's father earlier this week, when angry. He denies auditory or visual hallucinations.   Patient is dressed casually, alert, and oriented x4 with normal speech. Pt makes good eye contact. Pt has a flat affect and depressed mood. Pt's thought process is coherent and there is no indication he is responding to internal stimuli. Pt was cooperative throughout the assessment. Pt expresses he believes inpatient treatment may be helpful.  Chief Complaint:  Chief Complaint  Patient presents with   Suicidal   Visit Diagnosis: Bipolar I disorder, current or most recent episode depressed, severe   CCA Screening, Triage and Referral (STR)  Patient Reported Information How did you hear about [place]? Family/Friend  What Is the Reason for Your Visit/Call Today? Shawn "Aaron Schaefer" Dubs is a 42 y.o. single male who presents to [facility] voluntarily, accompanied by his mother Shawn Schaefer [phone] due to suicidal ideation with no plan and anger issues. Patient states "life has me down". He reports stressors are his relationship, legal trouble and not seeing his children. Pt repots a diagnosis of bipolar I and generalized anxiety disorder. Pt states he is prescribed Prozac by Rosina Lowenstein, NP, however he is neglectful in taking it regularly. Patient lives between the home of his mother and girlfriend. Patient has two previous suicide attempts by cop and hanging. Patient has been hospitalized at [facility], [facility] and [facility]. Pt denies access to guns or weapons. Pt states he has made homicidal threats  towards his ex-girlfriend's father earlier this week, when angry. He denies auditory or visual hallucinations.  How Long Has This Been Causing You Problems? 1 wk - 1 month  What Do You Feel Would  Help You the Most Today? Treatment for Depression or other mood problem   Have You Recently Had Any Thoughts About Hurting Yourself? Yes  Are You Planning to Commit Suicide/Harm Yourself At This time? No   Flowsheet Row ED from 09/10/2022 in [facility] ED from 02/08/2022 in [facility] Emergency Department at [facility] Admission (Discharged) from 10/14/2021 in [place] [facility]  C-SSRS RISK CATEGORY High Risk No Risk High Risk       Have you Recently Had Thoughts About Gays? Yes  Are You Planning to Harm Someone at This Time? No  Explanation: Pt reports he threatened his ex-girlfriends father when angry earlier this week.   Have You Used Any Alcohol or Drugs in the Past 24 Hours? No  What Did You Use and How Much? N/A   Do You Currently Have a Therapist/Psychiatrist? Yes  Name of Therapist/Psychiatrist: Name of Therapist/Psychiatrist: Rosina Lowenstein, NP with [facility] [place] Recently Discharged From Any Office Practice or Programs? No  Explanation of Discharge From Practice/Program: N/A     CCA Screening Triage Referral Assessment Type of Contact: Face-to-Face  Telemedicine Service Delivery:   Is this Initial or Reassessment?   Date Telepsych consult ordered in [facility]:    Time Telepsych consult ordered in [facility]:    Location of Assessment: [facility] Assessment Services  Provider Location: [facility] Assessment Services   Collateral Involvement: None   Does Patient Have a Stage manager Guardian? No  Legal Guardian Contact Information: N/A  Copy of Legal Guardianship Form: -- (N/A)  Legal Guardian Notified of Arrival: -- (N/A)  Legal Guardian Notified of Pending Discharge: -- (N/A)  If Minor and Not Living with Parent(s), Who has Custody? N/A  Is CPS involved or ever been involved? Never  Is APS involved or ever been involved? Never   Patient  Determined To Be At Risk for Harm To Self or Others Based on Review of Patient Reported Information or Presenting Complaint? Yes, for Self-Harm (Denies HI)  Method: No Plan (Denies Hi)  Availability of Means: No access or NA (Denies HI)  Intent: Vague intent or NA (Denies HI)  Notification Required: No need or identified person  Additional Information for Danger to Others Potential: -- (N/A)  Additional Comments for Danger to Others Potential: N/A  Are There Guns or Other Weapons in Your Home? No  Types of Guns/Weapons: N/A  Are These Weapons Safely Secured?                            -- (N/A)  Who Could Verify You Are Able To Have These Secured: N/A  Do You Have any Outstanding Charges, Pending Court Dates, Parole/Probation? Pt reports a court date on 09/14/2022 for a DUI  Contacted To Inform of Risk of Harm To Self or Others: -- (N/A)    Does Patient Present under Involuntary Commitment? No    [place] of Residence: [place]   Patient Currently Receiving the Following Services: Medication Management   Determination of Need: Emergent (2 hours)   Options For Referral: Inpatient Hospitalization; Intensive Outpatient Therapy     CCA Biopsychosocial Patient Reported Schizophrenia/Schizoaffective Diagnosis in Past: No  Strengths: Pt is seeking help   Mental Health Symptoms Depression:   Irritability; Hopelessness; Change in energy/activity; Tearfulness   Duration of Depressive symptoms: Duration of Depressive Symptoms: Less than two weeks   Mania:   None   Anxiety:    None   Psychosis:   None   Duration of Psychotic symptoms:    Trauma:   None   Obsessions:   None   Compulsions:   None   Inattention:   None   Hyperactivity/Impulsivity:   None   Oppositional/Defiant Behaviors:   None   Emotional Irregularity:   None   Other Mood/Personality Symptoms:   N/A    Mental Status Exam Appearance and self-care  Stature:   Average    Weight:   Average weight   Clothing:   Casual   Grooming:   Normal   Cosmetic use:   None   Posture/gait:   Normal   Motor activity:   Not Remarkable   Sensorium  Attention:   Normal   Concentration:   Normal   Orientation:   X5   Recall/memory:   Normal   Affect and Mood  Affect:   Depressed; Flat   Mood:   Depressed   Relating  Eye contact:   Normal   Facial expression:   Depressed   Attitude toward examiner:   Cooperative   Thought and Language  Speech flow:  Normal   Thought content:   Appropriate to Mood and Circumstances   Preoccupation:   None   Hallucinations:   None   Organization:   Linear   Transport planner of Knowledge:   Average   Intelligence:   Average   Abstraction:   Normal   Judgement:   Fair   Art therapist:   Adequate   Insight:   Good   Decision Making:   Normal   Social Functioning  Social Maturity:   Isolates   Social Judgement:   Normal   Stress  Stressors:   Scientist, research (physical sciences); Relationship   Coping Ability:   Programme researcher, broadcasting/film/video Deficits:   None   Supports:   Friends/Service system     Religion: Religion/Spirituality Are You A Religious Person?: Yes What is Your Religious Affiliation?: Christian How Might This Affect Treatment?: N/A  Leisure/Recreation: Leisure / Recreation Do You Have Hobbies?: No  Exercise/Diet: Exercise/Diet Do You Exercise?: No Have You Gained or Lost A Significant Amount of Weight in the Past Six Months?: No Do You Follow a Special Diet?: No Do You Have Any Trouble Sleeping?: Yes Explanation of Sleeping Difficulties: Pt reports waking up throughout the night   CCA Employment/Education Employment/Work Situation: Employment / Work Situation Employment Situation: Employed Work Stressors: None Patient's Job has Been Impacted by Current Illness: No Has Patient ever Been in Passenger transport manager?: No  Education: Education Is Patient Currently  Attending School?: No Last Grade Completed: 12 Did You Nutritional therapist?: No Did You Have An Individualized Education Program (IIEP): No Did You Have Any Difficulty At [employer]?: No Patient's Education Has Been Impacted by Current Illness: No   CCA Family/Childhood History Family and Relationship History: Family history Marital status: Long term relationship Long term relationship, how long?: 1 year What types of issues is patient dealing with in the relationship?: Pt did not specify relationship issues Additional relationship information: N/A Does patient have children?: Yes How many children?: 2 How is patient's relationship with their children?: "could be better"  Childhood History:  Childhood History By whom was/is  the patient raised?: Mother Did patient suffer any verbal/emotional/physical/sexual abuse as a child?: No Did patient suffer from severe childhood neglect?: No Has patient ever been sexually abused/assaulted/raped as an adolescent or adult?: No Was the patient ever a victim of a crime or a disaster?: No Witnessed domestic violence?: No Has patient been affected by domestic violence as an adult?: No       CCA Substance Use Alcohol/Drug Use: Alcohol / Drug Use Pain Medications: See MAR Prescriptions: See MAR Over the Counter: See MAR History of alcohol / drug use?: No history of alcohol / drug abuse Longest period of sobriety (when/how long): N/A Negative Consequences of Use:  (N/A) Withdrawal Symptoms:  (N/A)                         ASAM's:  Six Dimensions of Multidimensional Assessment  Dimension 1:  Acute Intoxication and/or Withdrawal Potential:      Dimension 2:  Biomedical Conditions and Complications:      Dimension 3:  Emotional, Behavioral, or Cognitive Conditions and Complications:     Dimension 4:  Readiness to Change:     Dimension 5:  Relapse, Continued use, or Continued Problem Potential:     Dimension 6:  Recovery/Living  Environment:     ASAM Severity Score:    ASAM Recommended Level of Treatment:     Substance use Disorder (SUD)    Recommendations for Services/Supports/Treatments:    Discharge Disposition:    DSM5 Diagnoses: Patient Active Problem List   Diagnosis Date Noted   Bipolar 1 disorder, depressed, severe ([place]) 10/14/2021   GAD (generalized anxiety disorder) 10/14/2021   Alcohol use disorder, severe, dependence ([place]) 10/14/2021   Tobacco use disorder 10/14/2021   HA (headache)    Aneurysm ([place])    Sepsis ([place]) 02/27/2015   Severe sepsis ([place]) 02/27/2015   Lactic acidosis 02/27/2015   Headache 02/27/2015   Hypothermia 02/27/2015   Social anxiety disorder 10/12/2013   MDD (major depressive disorder) 10/11/2013   Major depressive disorder, recurrent episode ([place]) 09/21/2011    Class: Acute   Substance abuse/dependence 09/21/2011    Class: Chronic   Suicidal behavior 09/20/2011   Depression 09/20/2011   Anger 09/20/2011   Substance abuse ([place]) 09/20/2011     Referrals to Alternative Service(s): Referred to Alternative Service(s):   Place:   Date:   Time:    Referred to Alternative Service(s):   Place:   Date:   Time:    Referred to Alternative Service(s):   Place:   Date:   Time:    Referred to Alternative Service(s):   Place:   Date:   Time:     Waylan Boga, LCSW

## 2022-09-10 NOTE — Progress Notes (Signed)
   09/10/22 2208  Shawn Schaefer (Walk-ins at [facility] only)  How Did You Hear About [place]? Family/Friend  What Is the Reason for Your Visit/Call Today? Shawn Schaefer is a 42 y.o. single male who presents to [facility] voluntarily, accompanied by his mother Greig Castilla [phone] due to suicidal ideation with no plan and anger issues. Patient states "life has me down". He reports stressors are his relationship, legal trouble and not seeing his children. Pt repots a diagnosis of bipolar I and generalized anxiety disorder. Pt states he is prescribed Prozac by Rosina Lowenstein, NP, however he is neglectful in taking it regularly. Patient lives between the home of his mother and girlfriend. Patient has two previous suicide attempts by cop and hanging. Patient has been hospitalized at [facility], [facility] and [facility]. Pt denies access to guns or weapons. Pt states he has made homicidal threats towards his ex-girlfriend's father earlier this week, when angry. He denies auditory or visual hallucinations. Pt expresses he believes he needs inpatient treatment.  How Long Has This Been Causing You Problems? 1 wk - 1 month  Have You Recently Had Any Thoughts About Hurting Yourself? Yes  How long ago did you have thoughts about hurting yourself? Today  Are You Planning to Commit Suicide/Harm Yourself At This time? No  Have you Recently Had Thoughts About [place]? Yes  How long ago did you have thoughts of harming others? Earlier this week  Are You Planning To Harm Someone At This Time? No  Are you currently experiencing any auditory, visual or other hallucinations? No  Have You Used Any Alcohol or Drugs in the Past 24 Hours? No  Do you have any current medical co-morbidities that require immediate attention? No  Clinician description of patient physical appearance/behavior: Patient is dressed casually, alert and oriented x4 with normal speech. Pt has a flat affect and depressed mood. Pt's thought  process is coherent and there is no indication he is responding to internal stimuli. Pt was cooperative throughout the assessment.  What Do You Feel Would Help You the Most Today? Treatment for Depression or other mood problem  If access to [facility] was not available, would you have sought care in the Emergency Department? No  Determination of Need Emergent (2 hours)  Options For Referral Inpatient Hospitalization

## 2022-09-10 NOTE — ED Notes (Signed)
Pt A&O x 4, presents with suicidal ideation, no plan noted.  Reports he has been feeling this way for the past week.  Previous SI attempts noted.  Contracts for safety.  Calm & cooperative.  Monitoring for safety.

## 2022-09-11 LAB — LIPID PANEL
Cholesterol: 192 mg/dL (ref 0–200)
HDL: 42 mg/dL (ref >40)
LDL Cholesterol: 94 mg/dL (ref 0–99)
Total CHOL/HDL Ratio: 4.6 RATIO
Triglycerides: 281 mg/dL — ABNORMAL HIGH (ref <150)
VLDL: 56 mg/dL — ABNORMAL HIGH (ref 0–40)

## 2022-09-11 LAB — COMPREHENSIVE METABOLIC PANEL
ALT: 30 U/L (ref 0–44)
AST: 27 U/L (ref 15–41)
Albumin: 4.4 g/dL (ref 3.5–5.0)
Alkaline Phosphatase: 78 U/L (ref 38–126)
Anion gap: 14 (ref 5–15)
BUN: 16 mg/dL (ref 6–20)
CO2: 22 mmol/L (ref 22–32)
Calcium: 9.4 mg/dL (ref 8.9–10.3)
Chloride: 104 mmol/L (ref 98–111)
Creatinine, Ser: 0.91 mg/dL (ref 0.61–1.24)
GFR, Estimated: >60 mL/min (ref >60)
Glucose, Bld: 114 mg/dL — ABNORMAL HIGH (ref 70–99)
Potassium: 3.7 mmol/L (ref 3.5–5.1)
Sodium: 140 mmol/L (ref 135–145)
Total Bilirubin: 0.7 mg/dL (ref 0.3–1.2)
Total Protein: 6.9 g/dL (ref 6.5–8.1)

## 2022-09-11 LAB — RESP PANEL BY RT-PCR (RSV, FLU A&B, COVID)  RVPGX2
Influenza A by PCR: NEGATIVE
Influenza B by PCR: NEGATIVE
Resp Syncytial Virus by PCR: NEGATIVE
SARS Coronavirus 2 by RT PCR: NEGATIVE

## 2022-09-11 LAB — TSH: TSH: 3.088 u[IU]/mL (ref 0.350–4.500)

## 2022-09-11 NOTE — Progress Notes (Addendum)
Received Shawn Schaefer this AM asleep in his chair bed, he was awaken by the provider and received his discharge  order. He feels safe  to go home and calling his mother for transportation.He was offered a bus token.

## 2022-09-11 NOTE — Progress Notes (Signed)
Shawn Schaefer arrived, he retrieved his personal items and was discharged  without incident. He received his AVS and his questions answered.

## 2022-09-11 NOTE — ED Notes (Signed)
Pt sleeping at present, no distress noted.  Monitoring for safety. 

## 2022-09-11 NOTE — Discharge Instructions (Addendum)
You are scheduled for an assessment for the PHP on 09/16/22 @ 10a. This appointment will last approximately one hour and will be virtual via Webex. PHP is virtual group therapy that runs Mon-Fri from 9am-1pm. Please download the [employer] app prior to the appointment. If you need to cancel or reschedule, please call [phone].

## 2022-09-11 NOTE — ED Provider Notes (Signed)
FBC/OBS ASAP Discharge Summary  Date and Time: 09/11/2022 11:20 AM  Name: Shawn Schaefer  MRN:  [id number]   Discharge Diagnoses:  Final diagnoses:  Suicidal ideation  Recurrent major depressive disorder, remission status unspecified ([name])  Behavior concern in adult    Subjective:   On reassessment, pt endorses coming to this facility for "suicidal thoughts and anger". Endorses passive suicidal ideation, no plan or intent. He feels he can keep himself safe. He verbally contracts to safety. He denies homicidal or violent ideations. He denies auditory visual hallucinations or paranoia. We discussed possible inpatient admission. Pt declined after learning we could not guarantee bed placement would be in [place]. Pt states he prefers to follow up outpatient. He agrees for php/iop referral, provides best contact information as [phone], [email or web address]. He gives verbal consent for me to speak with his mother, Shawn Schaefer, [phone].  Shawn Schaefer agrees w/ plan for pt to be discharged w/ php/iop referral. She states pt will be returning to her home and living with her. She can help monitor pt and if he is decompensating, she agrees to call 911/EMS, bring pt to the nearest emergency room or crisis center for evaluation. Safety planning completed to include: Frequent conversations regarding unsafe thoughts. Locking/monitoring the use of all significant sharps, including knives, razor blades, pencil sharpener razors. If there is a firearm in the home, keeping the firearm unloaded, locking the firearm, locking the ammunition separately from the firearm, preventing access to the firearm and the ammunition. Locking/monitoring the use of medications, including over-the-counter medications and supplements. Having a responsible person dispense medications until patient has strengthened coping skills. Room checks for sharps or other harmful objects. Secure all chemical substances that can be ingested or inhaled.  Securing any ligature risks. Calling 911/EMS or going to the nearest emergency room for any worsening of condition.  Pt scheduled for PHP intake on 09/16/22 at [place] w/ pt and Shawn Schaefer about this. Both agree pt will attend.  Stay Summary:   Pt is a 42 y/o male w/ history of anxiety, depression, migraines, presenting to [facility] on 09/10/22 due to suicidal ideations with no plan and anger issues. He was recommended for overnight observation. On reassessment, discussed possible inpatient admission, which pt declined after learning we could not guarantee bed placement would be in [place]. He verbally contracts to safety. Collateral w/ pt's mother who agrees w/ plan for discharge w/ php/iop referral. Pt scheduled for php intake on 09/16/22 at 10AM.  Total Time spent with patient: 45 minutes  Past Psychiatric History: Anxiety, Depression Past Medical History: Migraines Family History: None reported Family Psychiatric History: None reported Social History: Living between home of mother and girlfriend, pending court date on 09/14/22 for DUI Tobacco Cessation:  A prescription for an FDA-approved tobacco cessation medication was offered at discharge and the patient refused  Current Medications:  Current Facility-Administered Medications  Medication Dose Route Frequency Provider Last Rate Last Admin   acetaminophen (TYLENOL) tablet 650 mg  650 mg Oral Q6H PRN Evette Georges, NP   650 mg at 09/11/22 1050   alum & mag hydroxide-simeth (MAALOX/MYLANTA) 200-200-20 MG/5ML suspension 30 mL  30 mL Oral Q4H PRN Evette Georges, NP       OLANZapine zydis (ZYPREXA) disintegrating tablet 5 mg  5 mg Oral Q8H PRN Evette Georges, NP       And   LORazepam (ATIVAN) tablet 1 mg  1 mg Oral PRN Evette Georges, NP       And   ziprasidone (GEODON)  injection 20 mg  20 mg Intramuscular PRN Evette Georges, NP       magnesium hydroxide (MILK OF MAGNESIA) suspension 30 mL  30 mL Oral Daily PRN Evette Georges, NP       Current  Outpatient Medications  Medication Sig Dispense Refill   acetaminophen (TYLENOL) 500 MG tablet Take 1,000 mg by mouth every 6 (six) hours as needed for headache.     FLUoxetine (PROZAC) 20 MG capsule Take 1 capsule (20 mg total) by mouth daily. 30 capsule 0   lisinopril (ZESTRIL) 10 MG tablet Take 1 tablet (10 mg total) by mouth daily. 30 tablet 0   Multiple Vitamin (MULTIVITAMIN WITH MINERALS) TABS tablet Take 1 tablet by mouth daily.      PTA Medications:  Facility Ordered Medications  Medication   acetaminophen (TYLENOL) tablet 650 mg   alum & mag hydroxide-simeth (MAALOX/MYLANTA) 200-200-20 MG/5ML suspension 30 mL   magnesium hydroxide (MILK OF MAGNESIA) suspension 30 mL   OLANZapine zydis (ZYPREXA) disintegrating tablet 5 mg   And   LORazepam (ATIVAN) tablet 1 mg   And   ziprasidone (GEODON) injection 20 mg   PTA Medications  Medication Sig   lisinopril (ZESTRIL) 10 MG tablet Take 1 tablet (10 mg total) by mouth daily.   FLUoxetine (PROZAC) 20 MG capsule Take 1 capsule (20 mg total) by mouth daily.   Multiple Vitamin (MULTIVITAMIN WITH MINERALS) TABS tablet Take 1 tablet by mouth daily.   acetaminophen (TYLENOL) 500 MG tablet Take 1,000 mg by mouth every 6 (six) hours as needed for headache.        No data to display          [facility] from 09/10/2022 in [facility] ED from 02/08/2022 in [facility] Department at [facility] Admission (Discharged) from 10/14/2021 in [place] [facility]  C-SSRS RISK CATEGORY High Risk No Risk High Risk       Musculoskeletal  Strength & Muscle Tone: within normal limits Gait & Station: normal Patient leans: N/A  Psychiatric Specialty Exam  Presentation  General Appearance:  Disheveled; Casual  Eye Contact: Fair  Speech: Clear and Coherent; Normal Rate  Speech Volume: Normal  Handedness: Right   Mood and Affect   Mood: Depressed  Affect: Blunt   Thought Process  Thought Processes: Coherent; Goal Directed; Linear  Descriptions of Associations:Intact  Orientation:Full (Time, Place and Person)  Thought Content:Logical  Diagnosis of Schizophrenia or Schizoaffective disorder in past: No    Hallucinations:Hallucinations: None  Ideas of Reference:None  Suicidal Thoughts:Suicidal Thoughts: Yes, Passive  Homicidal Thoughts:Homicidal Thoughts: No   Sensorium  Memory: Immediate Fair  Judgment: Intact  Insight: Present   Executive Functions  Concentration: Fair  Attention Span: Fair  Recall: Good  Fund of Knowledge: Good  Language: Good   Psychomotor Activity  Psychomotor Activity: Psychomotor Activity: Normal   Assets  Assets: Communication Skills; Desire for Improvement; Financial Resources/Insurance; Housing; Physical Health; Resilience; Social Support; Transportation   Sleep  Sleep: Sleep: Fair Number of Hours of Sleep: 8   Nutritional Assessment (For OBS and FBC admissions only) Has the patient had a weight loss or gain of 10 pounds or more in the last 3 months?: No Has the patient had a decrease in food intake/or appetite?: No Does the patient have dental problems?: No Does the patient have eating habits or behaviors that may be indicators of an eating disorder including binging or inducing vomiting?: No Has the  patient recently lost weight without trying?: 0 Has the patient been eating poorly because of a decreased appetite?: 0 Malnutrition Screening Tool Score: 0    Physical Exam  Physical Exam Constitutional:      General: He is not in acute distress.    Appearance: He is not ill-appearing, toxic-appearing or diaphoretic.  Eyes:     General: No scleral icterus. Cardiovascular:     Rate and Rhythm: Normal rate.  Pulmonary:     Effort: Pulmonary effort is normal. No respiratory distress.  Neurological:     Mental Status: He is alert  and oriented to person, place, and time.  Psychiatric:        Attention and Perception: Attention and perception normal.        Mood and Affect: Mood is depressed. Affect is blunt.        Speech: Speech normal.        Behavior: Behavior normal. Behavior is cooperative.        Thought Content: Thought content includes suicidal ideation. Thought content does not include suicidal plan.        Cognition and Memory: Cognition and memory normal.    Review of Systems  Constitutional:  Negative for chills and fever.  Respiratory:  Negative for shortness of breath.   Cardiovascular:  Negative for chest pain and palpitations.  Gastrointestinal:  Negative for abdominal pain.  Neurological:  Negative for headaches.  Psychiatric/Behavioral:  Positive for depression and suicidal ideas.    Blood pressure (!) 128/90, pulse 75, temperature 98 F (36.7 C), temperature source Oral, resp. rate 18, SpO2 100 %. There is no height or weight on file to calculate BMI.  Demographic Factors:  Male and Caucasian  Loss Factors: NA  Historical Factors: Prior suicide attempts  Risk Reduction Factors:   Sense of responsibility to family, Living with another person, especially a relative, and Positive social support  Continued Clinical Symptoms:  Previous Psychiatric Diagnoses and Treatments  Cognitive Features That Contribute To Risk:  None    Suicide Risk:  Mild:  Suicidal ideation of limited frequency, intensity, duration, and specificity.  There are no identifiable plans, no associated intent, mild dysphoria and related symptoms, good self-control (both objective and subjective assessment), few other risk factors, and identifiable protective factors, including available and accessible social support.  Plan Of Care/Follow-up recommendations:  Referral to PHP/IOP  Disposition: Discharge  Tharon Aquas, NP 09/11/2022, 11:20 AM

## 2022-09-12 LAB — HEMOGLOBIN A1C
Hgb A1c MFr Bld: 5.3 % (ref 4.8–5.6)
Mean Plasma Glucose: 105 mg/dL

## 2022-09-16 ENCOUNTER — Other Ambulatory Visit (HOSPITAL_COMMUNITY): Payer: BC Managed Care – PPO | Attending: Psychiatry | Admitting: Licensed Clinical Social Worker

## 2022-09-16 ENCOUNTER — Telehealth (HOSPITAL_COMMUNITY): Payer: Self-pay | Admitting: Licensed Clinical Social Worker

## 2022-09-16 DIAGNOSIS — F3130 Bipolar disorder, current episode depressed, mild or moderate severity, unspecified: Secondary | ICD-10-CM | POA: Insufficient documentation
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
## 2022-09-21 ENCOUNTER — Telehealth (HOSPITAL_COMMUNITY): Payer: Self-pay | Admitting: Psychiatry

## 2022-09-21 NOTE — Telephone Encounter (Signed)
D:  MH-IOP Case Manager placed call to orient pt, but there was no answer and his voicemail box hadn't been set up.  A:  Will orient pt tomorrow during virtual MH-IOP.  Inform treatment team.

## 2022-09-22 ENCOUNTER — Other Ambulatory Visit (HOSPITAL_COMMUNITY): Payer: BC Managed Care – PPO | Admitting: Psychiatry

## 2022-09-22 ENCOUNTER — Telehealth (HOSPITAL_COMMUNITY): Payer: Self-pay | Admitting: Psychiatry

## 2022-09-23 ENCOUNTER — Other Ambulatory Visit (HOSPITAL_COMMUNITY): Payer: BC Managed Care – PPO

## 2022-09-23 ENCOUNTER — Telehealth (HOSPITAL_COMMUNITY): Payer: Self-pay | Admitting: Psychiatry

## 2022-09-23 NOTE — Telephone Encounter (Signed)
D:  Pt didn't show nor reach out to MH-IOP case manager this morning.  A:  Placed call to pt, but there was no answer.  Couldn't leave a vm because pt's voicemail box hasn't been set up.  Inform treatment team.

## 2022-09-24 ENCOUNTER — Other Ambulatory Visit (HOSPITAL_COMMUNITY): Payer: BC Managed Care – PPO | Admitting: Psychiatry

## 2022-09-25 ENCOUNTER — Other Ambulatory Visit (HOSPITAL_COMMUNITY): Payer: BC Managed Care – PPO

## 2022-09-28 ENCOUNTER — Other Ambulatory Visit (HOSPITAL_COMMUNITY): Payer: BC Managed Care – PPO

## 2022-09-29 ENCOUNTER — Other Ambulatory Visit (HOSPITAL_COMMUNITY): Payer: BC Managed Care – PPO

## 2022-09-30 ENCOUNTER — Other Ambulatory Visit (HOSPITAL_COMMUNITY): Payer: BC Managed Care – PPO

## 2022-10-01 ENCOUNTER — Other Ambulatory Visit (HOSPITAL_COMMUNITY): Payer: BC Managed Care – PPO

## 2022-10-02 ENCOUNTER — Other Ambulatory Visit (HOSPITAL_COMMUNITY): Payer: BC Managed Care – PPO

## 2022-10-05 ENCOUNTER — Other Ambulatory Visit (HOSPITAL_COMMUNITY): Payer: BC Managed Care – PPO

## 2022-10-06 ENCOUNTER — Other Ambulatory Visit (HOSPITAL_COMMUNITY): Payer: BC Managed Care – PPO

## 2022-10-07 ENCOUNTER — Other Ambulatory Visit (HOSPITAL_COMMUNITY): Payer: BC Managed Care – PPO

## 2023-01-17 DIAGNOSIS — R45851 Suicidal ideations: Secondary | ICD-10-CM | POA: Diagnosis not present

## 2023-01-17 DIAGNOSIS — R456 Violent behavior: Secondary | ICD-10-CM | POA: Insufficient documentation

## 2023-01-17 DIAGNOSIS — Y904 Blood alcohol level of 80-99 mg/100 ml: Secondary | ICD-10-CM | POA: Insufficient documentation

## 2023-01-17 DIAGNOSIS — F332 Major depressive disorder, recurrent severe without psychotic features: Secondary | ICD-10-CM | POA: Insufficient documentation

## 2023-01-17 DIAGNOSIS — F3130 Bipolar disorder, current episode depressed, mild or moderate severity, unspecified: Secondary | ICD-10-CM | POA: Diagnosis not present

## 2023-01-17 DIAGNOSIS — F1020 Alcohol dependence, uncomplicated: Secondary | ICD-10-CM | POA: Insufficient documentation

## 2023-01-18 ENCOUNTER — Other Ambulatory Visit: Payer: Self-pay

## 2023-01-18 ENCOUNTER — Encounter (HOSPITAL_COMMUNITY): Payer: Self-pay

## 2023-01-18 ENCOUNTER — Inpatient Hospital Stay (HOSPITAL_COMMUNITY)
Admission: AD | Admit: 2023-01-18 | Discharge: 2023-01-23 | DRG: 885 | Disposition: A | Discharge status: Home / Self Care | Payer: 59 | Source: Intra-hospital | Attending: Psychiatry | Admitting: Psychiatry

## 2023-01-18 ENCOUNTER — Emergency Department (EMERGENCY_DEPARTMENT_HOSPITAL)
Admission: EM | Admit: 2023-01-18 | Discharge: 2023-01-18 | Disposition: A | Discharge status: Other Acute Inpatient Hospital | Payer: 59 | Source: Home / Self Care | Attending: Emergency Medicine | Admitting: Emergency Medicine

## 2023-01-18 ENCOUNTER — Encounter (HOSPITAL_COMMUNITY): Payer: Self-pay | Admitting: Psychiatry

## 2023-01-18 DIAGNOSIS — R45851 Suicidal ideations: Secondary | ICD-10-CM

## 2023-01-18 DIAGNOSIS — F332 Major depressive disorder, recurrent severe without psychotic features: Secondary | ICD-10-CM | POA: Diagnosis present

## 2023-01-18 DIAGNOSIS — F411 Generalized anxiety disorder: Secondary | ICD-10-CM | POA: Diagnosis present

## 2023-01-18 DIAGNOSIS — I10 Essential (primary) hypertension: Secondary | ICD-10-CM | POA: Diagnosis present

## 2023-01-18 DIAGNOSIS — Z79899 Other long term (current) drug therapy: Secondary | ICD-10-CM

## 2023-01-18 DIAGNOSIS — F17210 Nicotine dependence, cigarettes, uncomplicated: Secondary | ICD-10-CM | POA: Diagnosis present

## 2023-01-18 DIAGNOSIS — Z046 Encounter for general psychiatric examination, requested by authority: Secondary | ICD-10-CM

## 2023-01-18 DIAGNOSIS — R456 Violent behavior: Secondary | ICD-10-CM | POA: Diagnosis present

## 2023-01-18 DIAGNOSIS — F1510 Other stimulant abuse, uncomplicated: Secondary | ICD-10-CM | POA: Diagnosis present

## 2023-01-18 DIAGNOSIS — F17200 Nicotine dependence, unspecified, uncomplicated: Secondary | ICD-10-CM | POA: Diagnosis present

## 2023-01-18 DIAGNOSIS — Z9151 Personal history of suicidal behavior: Secondary | ICD-10-CM | POA: Diagnosis not present

## 2023-01-18 DIAGNOSIS — F1020 Alcohol dependence, uncomplicated: Secondary | ICD-10-CM | POA: Diagnosis present

## 2023-01-18 DIAGNOSIS — F3130 Bipolar disorder, current episode depressed, mild or moderate severity, unspecified: Secondary | ICD-10-CM | POA: Diagnosis present

## 2023-01-18 HISTORY — DX: Essential (primary) hypertension: I10

## 2023-01-18 LAB — SALICYLATE LEVEL: Salicylate Lvl: <7 mg/dL — ABNORMAL LOW (ref 7.0–30.0)

## 2023-01-18 LAB — COMPREHENSIVE METABOLIC PANEL
ALT: 61 U/L — ABNORMAL HIGH (ref 0–44)
AST: 34 U/L (ref 15–41)
Albumin: 4.7 g/dL (ref 3.5–5.0)
Alkaline Phosphatase: 116 U/L (ref 38–126)
Anion gap: 17 — ABNORMAL HIGH (ref 5–15)
BUN: 10 mg/dL (ref 6–20)
CO2: 17 mmol/L — ABNORMAL LOW (ref 22–32)
Calcium: 9.3 mg/dL (ref 8.9–10.3)
Chloride: 103 mmol/L (ref 98–111)
Creatinine, Ser: 0.83 mg/dL (ref 0.61–1.24)
GFR, Estimated: >60 mL/min (ref >60)
Glucose, Bld: 114 mg/dL — ABNORMAL HIGH (ref 70–99)
Potassium: 3.4 mmol/L — ABNORMAL LOW (ref 3.5–5.1)
Sodium: 137 mmol/L (ref 135–145)
Total Bilirubin: 0.7 mg/dL (ref 0.3–1.2)
Total Protein: 8 g/dL (ref 6.5–8.1)

## 2023-01-18 LAB — CBC WITH DIFFERENTIAL/PLATELET
Abs Immature Granulocytes: 0.07 10*3/uL (ref 0.00–0.07)
Basophils Absolute: 0.1 10*3/uL (ref 0.0–0.1)
Basophils Relative: 1 %
Eosinophils Absolute: 0 10*3/uL (ref 0.0–0.5)
Eosinophils Relative: 1 %
HCT: 52.2 % — ABNORMAL HIGH (ref 39.0–52.0)
Hemoglobin: 17.5 g/dL — ABNORMAL HIGH (ref 13.0–17.0)
Immature Granulocytes: 1 %
Lymphocytes Relative: 17 %
Lymphs Abs: 1.4 10*3/uL (ref 0.7–4.0)
MCH: 30.6 pg (ref 26.0–34.0)
MCHC: 33.5 g/dL (ref 30.0–36.0)
MCV: 91.4 fL (ref 80.0–100.0)
Monocytes Absolute: 0.6 10*3/uL (ref 0.1–1.0)
Monocytes Relative: 7 %
Neutro Abs: 6.4 10*3/uL (ref 1.7–7.7)
Neutrophils Relative %: 73 %
Platelets: 265 10*3/uL (ref 150–400)
RBC: 5.71 MIL/uL (ref 4.22–5.81)
RDW: 13.4 % (ref 11.5–15.5)
WBC: 8.6 10*3/uL (ref 4.0–10.5)
nRBC: 0 % (ref 0.0–0.2)

## 2023-01-18 LAB — ACETAMINOPHEN LEVEL: Acetaminophen (Tylenol), Serum: <10 ug/mL — ABNORMAL LOW (ref 10–30)

## 2023-01-18 LAB — ETHANOL: Alcohol, Ethyl (B): 82 mg/dL — ABNORMAL HIGH (ref <10)

## 2023-01-18 MED ORDER — ALUM & MAG HYDROXIDE-SIMETH 200-200-20 MG/5ML PO SUSP: 30.0000 mL | ORAL | Status: DC | PRN

## 2023-01-18 MED ORDER — LORAZEPAM 1 MG PO TABS: 2.0000 mg | ORAL_TABLET | Freq: Three times a day (TID) | ORAL | Status: DC | PRN

## 2023-01-18 MED ORDER — LORAZEPAM 2 MG/ML IJ SOLN: 2.0000 mg | Freq: Three times a day (TID) | INTRAMUSCULAR | Status: DC | PRN

## 2023-01-18 MED ORDER — HALOPERIDOL LACTATE 5 MG/ML IJ SOLN: 5.0000 mg | Freq: Three times a day (TID) | INTRAMUSCULAR | Status: DC | PRN

## 2023-01-18 MED ORDER — DIPHENHYDRAMINE HCL 50 MG/ML IJ SOLN: 50.0000 mg | Freq: Three times a day (TID) | INTRAMUSCULAR | Status: DC | PRN

## 2023-01-18 MED ORDER — DIPHENHYDRAMINE HCL 25 MG PO CAPS: 50.0000 mg | ORAL_CAPSULE | Freq: Three times a day (TID) | ORAL | Status: DC | PRN

## 2023-01-18 MED ORDER — MAGNESIUM HYDROXIDE 400 MG/5ML PO SUSP: 30.0000 mL | Freq: Every day | ORAL | Status: DC | PRN

## 2023-01-18 MED ORDER — ACETAMINOPHEN 325 MG PO TABS
650.0000 mg | ORAL_TABLET | Freq: Four times a day (QID) | ORAL | Status: DC | PRN
  Administered 2023-01-20 – 2023-01-22 (×3): 650 mg via ORAL
  Filled 2023-01-18 (×3): qty 2

## 2023-01-18 MED ORDER — HALOPERIDOL 5 MG PO TABS: 5.0000 mg | ORAL_TABLET | Freq: Three times a day (TID) | ORAL | Status: DC | PRN

## 2023-01-18 MED ORDER — FLUOXETINE HCL 20 MG PO CAPS
20.0000 mg | ORAL_CAPSULE | Freq: Every day | ORAL | Status: DC
  Administered 2023-01-20 – 2023-01-23 (×4): 20 mg via ORAL
  Filled 2023-01-18 (×9): qty 1

## 2023-01-18 NOTE — ED Notes (Signed)
Patient did not eat breakfast
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
## 2023-01-18 NOTE — ED Notes (Signed)
Calm, cooperative all night resting all night.
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
## 2023-01-18 NOTE — Plan of Care (Signed)
°  Problem: Coping: Goal: Will verbalize feelings Outcome: Progressing   Problem: Safety: Goal: Ability to disclose and discuss suicidal ideas will improve Outcome: Progressing Goal: Ability to identify and utilize support systems that promote safety will improve Outcome: Progressing

## 2023-01-18 NOTE — Progress Notes (Signed)
Patient has been isolative to his room since admission. He did not come out for dinner and requested to not take his scheduled prozac today, states "I will take it tomorrow." Encouraged patient to get out of bed and come out onto the unit. Verbal encouragement given.

## 2023-01-18 NOTE — Plan of Care (Signed)
  Problem: Coping: Goal: Will verbalize feelings Outcome: Progressing   Problem: Role Relationship: Goal: Will demonstrate positive changes in social behaviors and relationships Outcome: Progressing   Problem: Safety: Goal: Ability to disclose and discuss suicidal ideas will improve 01/18/2023 1502 by Harless Litten, RN Outcome: Progressing 01/18/2023 1454 by Harless Litten, RN Outcome: Progressing Goal: Ability to identify and utilize support systems that promote safety will improve 01/18/2023 1502 by Harless Litten, RN Outcome: Progressing 01/18/2023 1454 by Harless Litten, RN Outcome: Progressing

## 2023-01-18 NOTE — Tx Team (Signed)
Initial Treatment Plan 01/18/2023 3:03 PM Shawn Schaefer ION:[id number]    PATIENT STRESSORS: Marital or family conflict   Medication change or noncompliance     PATIENT STRENGTHS: Ability for insight  Supportive family/friends    PATIENT IDENTIFIED PROBLEMS: Stopped taking his prozac and has been declining                     DISCHARGE CRITERIA:  Ability to meet basic life and health needs Adequate post-discharge living arrangements Improved stabilization in mood, thinking, and/or behavior  PRELIMINARY DISCHARGE PLAN: Outpatient therapy Return to previous living arrangement Return to previous work or school arrangements  PATIENT/FAMILY INVOLVEMENT: This treatment plan has been presented to and reviewed with the patient, Shawn Schaefer. The patient and family have been given the opportunity to ask questions and make suggestions.  Cherre Blanc, RN 01/18/2023, 3:03 PM

## 2023-01-18 NOTE — Progress Notes (Signed)
Gave patient cup for UDS to be collected.

## 2023-01-18 NOTE — Progress Notes (Signed)
Patient admitted to [facility] under IVC from [facility]- patient reports he has a history of MDD and alcohol use disorder. Patient is minimal in providing information regarding his suicidal thoughts. He is very flat and sad. He denies current SI/HI/AVH and contracts for safety on the unit. Shawn Schaefer reports he used to take Prozac but he stopped taking it, he is unable to give a definite reason for why he stopped taking his medication. He was given a sandwich tray when he arrived on the unit and oriented to the unit. Contracts for safety at this time.

## 2023-01-18 NOTE — Progress Notes (Signed)
Pt has been accepted to [facility] TODAY 01/18/2023. Bed assignment: 407-1  Pt meets inpatient criteria per Dahlia Byes, NP  Attending Physician will be Nadir Abbott Pao, MD  Report can be called to: - Adult unit: [phone]  Pt can arrive after pending discharges; [facility] to coordinate arrival time  Care Team Notified: [facility] Rona Ravens, RN, Harless Litten, RN, Lorella Nimrod, RN, and Dahlia Byes, NP  [place], [place]  01/18/2023 9:57 AM

## 2023-01-18 NOTE — ED Notes (Signed)
Breakfast tray given. °

## 2023-01-18 NOTE — BH Assessment (Signed)
TTS clinician attempted to complete assessment. Jesslyn, NT, patient is currently asleep. RN was not available at the time.
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
## 2023-01-19 DIAGNOSIS — F3130 Bipolar disorder, current episode depressed, mild or moderate severity, unspecified: Secondary | ICD-10-CM | POA: Diagnosis not present

## 2023-01-19 MED ORDER — LOPERAMIDE HCL 2 MG PO CAPS: 2.0000 mg | ORAL_CAPSULE | ORAL | Status: AC | PRN

## 2023-01-19 MED ORDER — VITAMIN B-1 100 MG PO TABS
100.0000 mg | ORAL_TABLET | Freq: Every day | ORAL | Status: DC
  Administered 2023-01-20 – 2023-01-23 (×4): 100 mg via ORAL
  Filled 2023-01-19 (×6): qty 1

## 2023-01-19 MED ORDER — ONDANSETRON 4 MG PO TBDP: 4.0000 mg | ORAL_TABLET | Freq: Four times a day (QID) | ORAL | Status: AC | PRN

## 2023-01-19 MED ORDER — ADULT MULTIVITAMIN W/MINERALS CH
1.0000 | ORAL_TABLET | Freq: Every day | ORAL | Status: DC
  Administered 2023-01-20 – 2023-01-22 (×3): 1 via ORAL
  Filled 2023-01-19 (×9): qty 1

## 2023-01-19 MED ORDER — LORAZEPAM 1 MG PO TABS: 1.0000 mg | ORAL_TABLET | Freq: Four times a day (QID) | ORAL | Status: AC | PRN

## 2023-01-19 MED ORDER — NICOTINE 14 MG/24HR TD PT24
14.0000 mg | MEDICATED_PATCH | Freq: Every day | TRANSDERMAL | Status: DC
  Filled 2023-01-19 (×8): qty 1

## 2023-01-19 MED ORDER — TRAZODONE HCL 50 MG PO TABS
50.0000 mg | ORAL_TABLET | Freq: Every evening | ORAL | Status: DC | PRN
  Administered 2023-01-22: 50 mg via ORAL
  Filled 2023-01-19 (×2): qty 1

## 2023-01-19 MED ORDER — ARIPIPRAZOLE 5 MG PO TABS
5.0000 mg | ORAL_TABLET | Freq: Every day | ORAL | Status: DC
  Administered 2023-01-20 – 2023-01-23 (×4): 5 mg via ORAL
  Filled 2023-01-19 (×7): qty 1

## 2023-01-19 MED ORDER — CLONIDINE HCL 0.1 MG PO TABS
0.1000 mg | ORAL_TABLET | ORAL | Status: DC | PRN
  Administered 2023-01-21 – 2023-01-22 (×2): 0.1 mg via ORAL
  Filled 2023-01-19: qty 1

## 2023-01-19 MED ORDER — NICOTINE POLACRILEX 2 MG MT GUM: 2.0000 mg | CHEWING_GUM | OROMUCOSAL | Status: DC | PRN

## 2023-01-19 MED ORDER — LISINOPRIL 5 MG PO TABS
5.0000 mg | ORAL_TABLET | Freq: Every day | ORAL | Status: DC
  Administered 2023-01-20 – 2023-01-22 (×3): 5 mg via ORAL
  Filled 2023-01-19 (×8): qty 1

## 2023-01-19 MED ORDER — HYDROXYZINE HCL 25 MG PO TABS
25.0000 mg | ORAL_TABLET | Freq: Three times a day (TID) | ORAL | Status: DC | PRN
  Administered 2023-01-22: 25 mg via ORAL
  Filled 2023-01-19 (×2): qty 1

## 2023-01-19 NOTE — Progress Notes (Signed)
   01/19/23 0615  15 Minute Checks  Location Bedroom  Visual Appearance Calm  Behavior Composed  Sleep (Behavioral Health Patients Only)  Calculate sleep? (Click Yes once per 24 hr at 0600 safety check) Yes  Documented sleep last 24 hours 11

## 2023-01-19 NOTE — Progress Notes (Signed)
  During this shift, patient is assessed in his room.  Patient is laying in the bed and presents with moderate depression and sadness.  Patient is encouraged to attend evening group.  Patient continues to isolate in his room.  Patient requires no medications.  Patient denies SI/HI/AVH.  Patient is safe on the unit with q15 minute safety checks.

## 2023-01-19 NOTE — Group Note (Signed)
Recreation Therapy Group Note   Group Topic:Animal Assisted Therapy   Group Date: 01/19/2023 Start Time: 1610 End Time: 1030 Facilitators: Kelsen Celona-McCall, LRT,CTRS Location: [address] Dayroom   Animal-Assisted Activity (AAA) Program Checklist/Progress Notes Patient Eligibility Criteria Checklist & Daily Group note for Rec Tx Intervention  AAA/T Program Assumption of Risk Form signed by Patient/ or Parent Legal Guardian Yes  Patient understands his/her participation is voluntary Yes   Affect/Mood: N/A   Participation Level: Did not attend    Clinical Observations/Individualized Feedback:     Plan: Continue to engage patient in RT group sessions 2-3x/week.   Dewight Catino-McCall, LRT,CTRS  01/19/2023 1:47 PM

## 2023-01-19 NOTE — Plan of Care (Signed)
  Problem: Education: Goal: Knowledge of [facility] Education information/materials will improve Outcome: Progressing Goal: Verbalization of understanding the information provided will improve Outcome: Progressing   

## 2023-01-19 NOTE — Progress Notes (Signed)
   01/19/23 0634  Vital Signs  Temp 97.7 F (36.5 C)  Temp Source Oral  Pulse Rate 66  Pulse Rate Source Monitor  Resp 16  BP (!) 130/101  BP Location Left Arm  BP Method Automatic  Patient Position (if appropriate) Sitting  Oxygen Therapy  SpO2 99 %     01/19/23 0635  Vital Signs  Pulse Rate 73  BP (!) 131/103  BP Location Left Arm  BP Method Automatic  Patient Position (if appropriate) Standing   Patient asymptomatic.  Patient isolated to room all of shift, not hydrating properly.  Hydrated patient.  Will recheck patient BP.

## 2023-01-19 NOTE — Progress Notes (Signed)
    01/18/23 2030  Psych Admission Type (Psych Patients Only)  Admission Status Involuntary  Psychosocial Assessment  Patient Complaints Depression  Eye Contact Brief  Facial Expression Sad  Affect Depressed  Speech Logical/coherent  Interaction Isolative  Motor Activity Slow  Appearance/Hygiene Disheveled  Behavior Characteristics Cooperative  Mood Depressed;Sad  Thought Process  Coherency WDL  Content WDL  Delusions None reported or observed  Perception WDL  Hallucination None reported or observed  Judgment Poor  Confusion None  Danger to Self  Current suicidal ideation? Denies  Agreement Not to Harm Self Yes  Description of Agreement verbal  Danger to Others  Danger to Others None reported or observed

## 2023-01-19 NOTE — Plan of Care (Signed)
?  Problem: Education: ?Goal: Knowledge of [facility] Education information/materials will improve ?Outcome: Not Progressing ?Goal: Emotional status will improve ?Outcome: Not Progressing ?Goal: Mental status will improve ?Outcome: Not Progressing ?Goal: Verbalization of understanding the information provided will improve ?Outcome: Not Progressing ?  ?Problem: Activity: ?Goal: Interest or engagement in activities will improve ?Outcome: Not Progressing ?Goal: Sleeping patterns will improve ?Outcome: Not Progressing ?  ?Problem: Coping: ?Goal: Ability to verbalize frustrations and anger appropriately will improve ?Outcome: Not Progressing ?Goal: Ability to demonstrate self-control will improve ?Outcome: Not Progressing ?  ?Problem: Health Behavior/Discharge Planning: ?Goal: Identification of resources available to assist in meeting health care needs will improve ?Outcome: Not Progressing ?Goal: Compliance with treatment plan for underlying cause of condition will improve ?Outcome: Not Progressing ?  ?Problem: Physical Regulation: ?Goal: Ability to maintain clinical measurements within normal limits will improve ?Outcome: Not Progressing ?  ?Problem: Safety: ?Goal: Periods of time without injury will increase ?Outcome: Not Progressing ?  ?Problem: Education: ?Goal: Utilization of techniques to improve thought processes will improve ?Outcome: Not Progressing ?Goal: Knowledge of the prescribed therapeutic regimen will improve ?Outcome: Not Progressing ?  ?Problem: Activity: ?Goal: Interest or engagement in leisure activities will improve ?Outcome: Not Progressing ?Goal: Imbalance in normal sleep/wake cycle will improve ?Outcome: Not Progressing ?  ?Problem: Coping: ?Goal: Coping ability will improve ?Outcome: Not Progressing ?Goal: Will verbalize feelings ?Outcome: Not Progressing ?  ?Problem: Health Behavior/Discharge Planning: ?Goal: Ability to make decisions will improve ?Outcome: Not Progressing ?Goal:  Compliance with therapeutic regimen will improve ?Outcome: Not Progressing ?  ?Problem: Role Relationship: ?Goal: Will demonstrate positive changes in social behaviors and relationships ?Outcome: Not Progressing ?  ?Problem: Safety: ?Goal: Ability to disclose and discuss suicidal ideas will improve ?Outcome: Not Progressing ?Goal: Ability to identify and utilize support systems that promote safety will improve ?Outcome: Not Progressing ?  ?Problem: Self-Concept: ?Goal: Will verbalize positive feelings about self ?Outcome: Not Progressing ?Goal: Level of anxiety will decrease ?Outcome: Not Progressing ?  ?
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
## 2023-01-19 NOTE — [facility] Group Notes (Signed)
Adult Psychoeducational Group Note  Date:  01/19/2023 Time:  11:21 AM  Group Topic/Focus:  Goals Group:   The focus of this group is to help patients establish daily goals to achieve during treatment and discuss how the patient can incorporate goal setting into their daily lives to aide in recovery. Orientation:   The focus of this group is to educate the patient on the purpose and policies of crisis stabilization and provide a format to answer questions about their admission.  The group details unit policies and expectations of patients while admitted.  Participation Level:  Did Not Attend  Participation Quality:    Affect:    Cognitive:    Insight:   Engagement in Group:    Modes of Intervention:    Additional Comments:    Sheran Lawless 01/19/2023, 11:21 AM

## 2023-01-19 NOTE — Progress Notes (Signed)
Dar Note: Patient refused to take any of his prescribed medications after several attempts/encouragement. Brought his meds to him but still refused. Current BP at 131/103. stated, "I will rather have a stroke than take my BP med (Lisinopril). Recommending that patient be sent out for medical evaluation. Patient is not eating or drinking x 2 days. MD made aware.

## 2023-01-20 ENCOUNTER — Encounter (HOSPITAL_COMMUNITY): Payer: Self-pay

## 2023-01-20 DIAGNOSIS — F3130 Bipolar disorder, current episode depressed, mild or moderate severity, unspecified: Secondary | ICD-10-CM | POA: Diagnosis not present

## 2023-01-20 NOTE — Group Note (Signed)
Recreation Therapy Group Note   Group Topic:Other  Group Date: 01/20/2023 Start Time: 5409 End Time: 1015 Facilitators: Malessa Zartman-McCall, LRT,CTRS Location: [address] Dayroom   Goal Area(s) Addresses:  Patient will identify positive leisure and recreation activities.  Patient will identify one positive benefit of participation in leisure activities.    Group Description: Music Trivia. Patients were divided in to 2 groups for game play. LRT read song lyrics from songs in the  1990s and 2000s hip hop and R&B from a playing card. Each team takes turns answering the question. If they are unable to answer the question, the other team gets the chance to steal the point. The team with the most points wins the game.    Affect/Mood: N/A   Participation Level: Did not attend    Clinical Observations/Individualized Feedback:     Plan: Continue to engage patient in RT group sessions 2-3x/week.   Bradie Lacock-McCall, LRT,CTRS 01/20/2023 12:42 PM

## 2023-01-20 NOTE — Plan of Care (Signed)
Problem: Health Behavior/Discharge Planning: Goal: Compliance with treatment plan for underlying cause of condition will improve Outcome: Progressing   Problem: Self-Concept: Goal: Level of anxiety will decrease Outcome: Progressing   Pt visible in bed on initial approach. Observed to be irritable with blunted affect, minimal with avertive eye contact, when he finally took his bed sheet off his face with multiple prompts "I told you I have a headache, what more do you want". Denies SI, HI and AVH at the time with complaint of headache 7/10. PRN Tylenol 650 mg PO given at 1104 with desired effect when reassessed at 1200. Took his scheduled morning medications after multiple prompts "I'm dizzy, I can't get up. I don't want to be here". Observed in hall at brief intervals, isolating from peers. Went off unit for dinner and returned without issues.  Safety checks maintained at Q 15 minutes intervals without self harm gestures. Emotional support, encouragement and reassurance offered to pt. Educated on treatment compliant in reference to discharge needs. Pt awake in bed, denies concerns at this time.

## 2023-01-20 NOTE — Progress Notes (Signed)
    01/19/23 1100  Psych Admission Type (Psych Patients Only)  Admission Status Involuntary  Psychosocial Assessment  Patient Complaints Depression  Eye Contact Brief  Facial Expression Flat  Affect Depressed  Speech Logical/coherent  Interaction Isolative  Motor Activity Slow  Appearance/Hygiene Disheveled  Behavior Characteristics Unwilling to participate  Mood Depressed  Thought Process  Coherency WDL  Content WDL  Delusions None reported or observed  Perception WDL  Hallucination None reported or observed  Judgment Poor  Confusion None  Danger to Self  Current suicidal ideation? Denies  Danger to Others  Danger to Others None reported or observed

## 2023-01-20 NOTE — [facility] Group Notes (Signed)

## 2023-01-20 NOTE — Plan of Care (Signed)
  Problem: Education: Goal: Emotional status will improve Outcome: Progressing Goal: Mental status will improve Outcome: Progressing   

## 2023-01-20 NOTE — Progress Notes (Signed)
During this writer's shift, patient presents with moderate depression.  Patient is isolated to the room, and it has been reported to this writer that patient is not eating, drinking, nor taking his medications.  No meds are due tonight.  BP is in normal range of BP 126/77, HR 69.  Patient denies SI/HI/AVH.  Patient is safe on the with q 15 minute safety checks.

## 2023-01-20 NOTE — Progress Notes (Signed)
   01/19/23 2100  Psych Admission Type (Psych Patients Only)  Admission Status Involuntary  Psychosocial Assessment  Patient Complaints Depression  Eye Contact Brief  Facial Expression Flat  Affect Depressed  Speech Logical/coherent  Interaction Assertive  Motor Activity Slow  Appearance/Hygiene Disheveled  Behavior Characteristics Unable to participate  Mood Depressed  Thought Process  Coherency WDL  Content WDL  Delusions None reported or observed  Perception WDL  Hallucination None reported or observed  Judgment Poor  Confusion None  Danger to Self  Current suicidal ideation? Denies  Agreement Not to Harm Self Yes  Description of Agreement verbal  Danger to Others  Danger to Others None reported or observed
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
## 2023-01-20 NOTE — [facility] Group Notes (Signed)
[facility] Group Notes:  (Nursing/MHT/Case Management/Adjunct)  Date:  01/20/2023  Time:  2000  Type of Therapy:   Narcotics Anonymous Meeting  Participation Level:  Active  Participation Quality:  Appropriate and Attentive  Affect:  Depressed and Irritable  Cognitive:  Alert  Insight:  Limited  Engagement in Group:  Developing/Improving  Modes of Intervention:  Education and Support  Summary of Progress/Problems:  Marcille Buffy 01/20/2023, 10:36 PM

## 2023-01-20 NOTE — Progress Notes (Signed)
   01/20/23 0630  15 Minute Checks  Location Bedroom  Visual Appearance Calm  Behavior Composed  Sleep (Behavioral Health Patients Only)  Calculate sleep? (Click Yes once per 24 hr at 0600 safety check) Yes  Documented sleep last 24 hours 9.25

## 2023-01-20 NOTE — BH IP Treatment Plan (Signed)
Interdisciplinary Treatment and Diagnostic Plan   01/20/2023 Time of Session: 10:29 Shawn Schaefer MRN: [id number]  Principal Diagnosis: Bipolar I disorder, most recent episode depressed (HCC)  Secondary Diagnoses: Principal Problem:   Bipolar I disorder, most recent episode depressed (HCC) Active Problems:   GAD (generalized anxiety disorder)   Alcohol use disorder, severe, dependence (HCC)   Tobacco use disorder   Current Medications:  Current Facility-Administered Medications  Medication Dose Route Frequency Provider Last Rate Last Admin   acetaminophen (TYLENOL) tablet 650 mg  650 mg Oral Q6H PRN Dahlia Byes C, NP   650 mg at 01/20/23 1104   alum & mag hydroxide-simeth (MAALOX/MYLANTA) 200-200-20 MG/5ML suspension 30 mL  30 mL Oral Q4H PRN Dahlia Byes C, NP       ARIPiprazole (ABILIFY) tablet 5 mg  5 mg Oral Daily Massengill, Nathan, MD   5 mg at 01/20/23 1103   cloNIDine (CATAPRES) tablet 0.1 mg  0.1 mg Oral Q4H PRN Massengill, Harrold Donath, MD       diphenhydrAMINE (BENADRYL) capsule 50 mg  50 mg Oral TID PRN Earney Navy, NP       Or   diphenhydrAMINE (BENADRYL) injection 50 mg  50 mg Intramuscular TID PRN Earney Navy, NP       FLUoxetine (PROZAC) capsule 20 mg  20 mg Oral Daily Onuoha, Josephine C, NP   20 mg at 01/20/23 1103   haloperidol (HALDOL) tablet 5 mg  5 mg Oral TID PRN Earney Navy, NP       Or   haloperidol lactate (HALDOL) injection 5 mg  5 mg Intramuscular TID PRN Earney Navy, NP       hydrOXYzine (ATARAX) tablet 25 mg  25 mg Oral TID PRN Massengill, Harrold Donath, MD       lisinopril (ZESTRIL) tablet 5 mg  5 mg Oral Daily Massengill, Nathan, MD   5 mg at 01/20/23 1103   loperamide (IMODIUM) capsule 2-4 mg  2-4 mg Oral PRN Massengill, Harrold Donath, MD       LORazepam (ATIVAN) tablet 2 mg  2 mg Oral TID PRN Earney Navy, NP       Or   LORazepam (ATIVAN) injection 2 mg  2 mg Intramuscular TID PRN Earney Navy, NP        LORazepam (ATIVAN) tablet 1 mg  1 mg Oral Q6H PRN Massengill, Nathan, MD       magnesium hydroxide (MILK OF MAGNESIA) suspension 30 mL  30 mL Oral Daily PRN Earney Navy, NP       multivitamin with minerals tablet 1 tablet  1 tablet Oral Daily Massengill, Nathan, MD   1 tablet at 01/20/23 1103   nicotine (NICODERM CQ - dosed in mg/24 hours) patch 14 mg  14 mg Transdermal Daily Massengill, Nathan, MD       nicotine polacrilex (NICORETTE) gum 2 mg  2 mg Oral PRN Massengill, Harrold Donath, MD       ondansetron (ZOFRAN-ODT) disintegrating tablet 4 mg  4 mg Oral Q6H PRN Massengill, Harrold Donath, MD       thiamine (Vitamin B-1) tablet 100 mg  100 mg Oral Daily Massengill, Nathan, MD   100 mg at 01/20/23 1103   traZODone (DESYREL) tablet 50 mg  50 mg Oral QHS PRN Massengill, Harrold Donath, MD       PTA Medications: Medications Prior to Admission  Medication Sig Dispense Refill Last Dose   acetaminophen (TYLENOL) 500 MG tablet Take 1,000 mg by mouth every 6 (  six) hours as needed for headache. (Patient not taking: Reported on 01/18/2023)      FLUoxetine (PROZAC) 20 MG capsule Take 1 capsule (20 mg total) by mouth daily. 30 capsule 0    lisinopril (ZESTRIL) 10 MG tablet Take 1 tablet (10 mg total) by mouth daily. (Patient not taking: Reported on 01/18/2023) 30 tablet 0    Multiple Vitamin (MULTIVITAMIN WITH MINERALS) TABS tablet Take 1 tablet by mouth daily. (Patient not taking: Reported on 01/18/2023)       Patient Stressors: Marital or family conflict   Medication change or noncompliance    Patient Strengths: Ability for insight  Supportive family/friends   Treatment Modalities: Medication Management, Group therapy, Case management,  1 to 1 session with clinician, Psychoeducation, Recreational therapy.   Physician Treatment Plan for Primary Diagnosis: Bipolar I disorder, most recent episode depressed (HCC) Long Term Goal(s): Improvement in symptoms so as ready for discharge   Short Term Goals: Ability to  identify changes in lifestyle to reduce recurrence of condition will improve Ability to verbalize feelings will improve Ability to disclose and discuss suicidal ideas Ability to identify and develop effective coping behaviors will improve Compliance with prescribed medications will improve  Medication Management: Evaluate patient's response, side effects, and tolerance of medication regimen.  Therapeutic Interventions: 1 to 1 sessions, Unit Group sessions and Medication administration.  Evaluation of Outcomes: Progressing  Physician Treatment Plan for Secondary Diagnosis: Principal Problem:   Bipolar I disorder, most recent episode depressed (HCC) Active Problems:   GAD (generalized anxiety disorder)   Alcohol use disorder, severe, dependence (HCC)   Tobacco use disorder  Long Term Goal(s): Improvement in symptoms so as ready for discharge   Short Term Goals: Ability to identify changes in lifestyle to reduce recurrence of condition will improve Ability to verbalize feelings will improve Ability to disclose and discuss suicidal ideas Ability to identify and develop effective coping behaviors will improve Compliance with prescribed medications will improve     Medication Management: Evaluate patient's response, side effects, and tolerance of medication regimen.  Therapeutic Interventions: 1 to 1 sessions, Unit Group sessions and Medication administration.  Evaluation of Outcomes: Progressing   RN Treatment Plan for Primary Diagnosis: Bipolar I disorder, most recent episode depressed (HCC) Long Term Goal(s): Knowledge of disease and therapeutic regimen to maintain health will improve  Short Term Goals: Ability to remain free from injury will improve, Ability to verbalize frustration and anger appropriately will improve, Ability to demonstrate self-control, Ability to participate in decision making will improve, Ability to verbalize feelings will improve, Ability to disclose and  discuss suicidal ideas, Ability to identify and develop effective coping behaviors will improve, and Compliance with prescribed medications will improve  Medication Management: RN will administer medications as ordered by provider, will assess and evaluate patient's response and provide education to patient for prescribed medication. RN will report any adverse and/or side effects to prescribing provider.  Therapeutic Interventions: 1 on 1 counseling sessions, Psychoeducation, Medication administration, Evaluate responses to treatment, Monitor vital signs and CBGs as ordered, Perform/monitor CIWA, COWS, AIMS and Fall Risk screenings as ordered, Perform wound care treatments as ordered.  Evaluation of Outcomes: Progressing   LCSW Treatment Plan for Primary Diagnosis: Bipolar I disorder, most recent episode depressed (HCC) Long Term Goal(s): Safe transition to appropriate next level of care at discharge, Engage patient in therapeutic group addressing interpersonal concerns.  Short Term Goals: Engage patient in aftercare planning with referrals and resources, Increase social support, Increase ability to appropriately  verbalize feelings, Increase emotional regulation, Facilitate acceptance of mental health diagnosis and concerns, Facilitate patient progression through stages of change regarding substance use diagnoses and concerns, Identify triggers associated with mental health/substance abuse issues, and Increase skills for wellness and recovery  Therapeutic Interventions: Assess for all discharge needs, 1 to 1 time with Social worker, Explore available resources and support systems, Assess for adequacy in community support network, Educate family and significant other(s) on suicide prevention, Complete Psychosocial Assessment, Interpersonal group therapy.  Evaluation of Outcomes: Progressing   Progress in Treatment: Attending groups: Yes. Participating in groups: Yes. Taking medication as  prescribed: Yes. Toleration medication: Yes. Family/Significant other contact made: No, will contact:  Family Member Patient understands diagnosis: Yes. Discussing patient identified problems/goals with staff: Yes. Medical problems stabilized or resolved: Yes. Denies suicidal/homicidal ideation: Yes. Issues/concerns per patient self-inventory: Yes. Other: N/A  New problem(s) identified: No, Describe:  None reported  New Short Term/Long Term Goal(s): medication stabilization, elimination of SI thoughts, development of comprehensive mental wellness plan.   Patient Goals:  Medication Stabilization  Discharge Plan or Barriers: :  Patient recently admitted. CSW will continue to follow and assess for appropriate referrals and possible discharge planning.   Reason for Continuation of Hospitalization: Anxiety Depression Medication stabilization Suicidal ideation  Estimated Length of Stay: 3-7 Days  Last 3 [place] Suicide Severity Risk Score: Flowsheet Row [facility] (Current[facility] Most recent reading at 01/18/2023  2:46 PM ED from 01/18/2023 in [facility] Emergency Department at [facility] Most recent reading at 01/18/2023 12:10 AM Counselor from 09/16/2022 in [facility] Most recent reading at 09/16/2022 10:26 AM  C-SSRS RISK CATEGORY No Risk No Risk Error: Q3, 4, or 5 should not be populated when Q2 is No       Last PHQ 2/9 Scores:    09/16/2022   10:26 AM  Depression screen PHQ 2/9  Decreased Interest 1  Down, Depressed, Hopeless 1  PHQ - 2 Score 2  Altered sleeping 1  Tired, decreased energy 1  Change in appetite 2  Feeling bad or failure about yourself  1  Trouble concentrating 0  Moving slowly or fidgety/restless 1  Suicidal thoughts 1  PHQ-9 Score 9  Difficult doing work/chores Very difficult    medication stabilization, elimination of SI thoughts, development of  comprehensive mental wellness plan.   Scribe for Treatment Team: Ane Payment, LCSW 01/20/2023 1:22 PM

## 2023-01-20 NOTE — [facility] Group Notes (Signed)
[facility] Group Notes:  (Nursing/MHT/Case Management/Adjunct)  Date:  01/20/2023  Time:  10:13 AM  Type of Therapy:  Group Topic/ Focus: Goals Group: The focus of this group is to help patients establish daily goals to achieve during treatment and discuss how the patient can incorporate goal setting into their daily lives to aide in recovery.   Participation Level:  Did Not Attend  Summary of Progress/Problems:  Patient did not attend goals group today. Patient was encouraged but refused.   Daneil Dan 01/20/2023, 10:13 AM

## 2023-01-21 DIAGNOSIS — F3130 Bipolar disorder, current episode depressed, mild or moderate severity, unspecified: Secondary | ICD-10-CM | POA: Diagnosis not present

## 2023-01-21 NOTE — Plan of Care (Signed)
  Problem: Activity: Goal: Interest or engagement in activities will improve Outcome: Progressing Goal: Sleeping patterns will improve Outcome: Progressing   

## 2023-01-21 NOTE — Group Note (Unsigned)
LCSW Group Therapy Note  Group Date: 01/21/2023 Start Time: 1100 End Time: 1145   Type of Therapy and Topic:  Group Therapy - Healthy vs Unhealthy Coping Skills  Participation Level:  {[facility] PARTICIPATION [id number]}   Description of Group The focus of this group was to determine what unhealthy coping techniques typically are used by group members and what healthy coping techniques would be helpful in coping with various problems. Patients were guided in becoming aware of the differences between healthy and unhealthy coping techniques. Patients were asked to identify 2-3 healthy coping skills they would like to learn to use more effectively.  Therapeutic Goals 1. Patients learned that coping is what human beings do all day long to deal with various situations in their lives 2. Patients defined and discussed healthy vs unhealthy coping techniques 3. Patients identified their preferred coping techniques and identified whether these were healthy or unhealthy 4. Patients determined 2-3 healthy coping skills they would like to become more familiar with and use more often. 5. Patients provided support and ideas to each other   Summary of Patient Progress:  During group, *** expressed ***. Patient proved open to input from peers and feedback from CSW. Patient demonstrated *** insight into the subject matter, was respectful of peers, and participated throughout the entire session.   Therapeutic Modalities Cognitive Behavioral Therapy Motivational Interviewing  Alanda Amass 01/21/2023  12:33 PM

## 2023-01-21 NOTE — Progress Notes (Signed)
   01/21/23 0621  Vital Signs  Pulse Rate 64  Pulse Rate Source Monitor  Resp 16  BP (!) 134/105  BP Location Left Arm  BP Method Automatic  Patient Position (if appropriate) Standing  Oxygen Therapy  SpO2 100 %   Administered PRN Clonidine per MAR per patient's request.
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
## 2023-01-21 NOTE — Progress Notes (Signed)
Pt reports that his energy has improved and that his sleep at night has been "good." He reports eating 2 meals a day. He rated his depression and anxiety a 5 on a scale of 0-10 (10 being the worst). He identifies his stressors as breaking-up with his girlfriend recently and not being able to see his kids for the past 2 years. He reports having 2 daughters, ages 22 and 40 years old. He also reports a recent switch in his insurances and currently not having an outpatient psychiatrist or therapist. Informed pt that social work will ensure that he has appointments set up with a  psychiatrist and therapist before discharge. Also educated pt about things he can work on to prepare for discharge. He shared that he has been attending more groups and he did attend wrap-up group earlier. Pt denies SI/HI and AVH. Active listening, reassurance, and support provided. Q 15 min safety checks continue. Pt's safety has been maintained.   01/21/23 2105  Psych Admission Type (Psych Patients Only)  Admission Status Involuntary  Psychosocial Assessment  Patient Complaints Anxiety;Depression;Sadness;Worrying  Eye Contact Brief  Facial Expression Flat;Sad  Affect Anxious;Depressed;Sad;Irritable  Speech Logical/coherent  Interaction Assertive  Motor Activity Other (Comment) (WNL)  Appearance/Hygiene Unremarkable  Behavior Characteristics Cooperative;Appropriate to situation;Anxious  Mood Depressed;Anxious  Thought Process  Coherency WDL  Content WDL  Delusions None reported or observed  Perception WDL  Hallucination None reported or observed  Judgment Poor  Confusion None  Danger to Self  Current suicidal ideation? Denies  Agreement Not to Harm Self Yes  Description of Agreement verbally contracts for safety  Danger to Others  Danger to Others None reported or observed

## 2023-01-21 NOTE — Plan of Care (Signed)
  Problem: Education: Goal: Emotional status will improve Outcome: Not Progressing Goal: Mental status will improve Outcome: Not Progressing   Problem: Activity: Goal: Interest or engagement in activities will improve Outcome: Not Progressing   Problem: Coping: Goal: Ability to verbalize frustrations and anger appropriately will improve Outcome: Not Progressing Goal: Ability to demonstrate self-control will improve Outcome: Not Progressing

## 2023-01-21 NOTE — [facility] Suicide Risk Assessment (Signed)
[facility] INPATIENT:  Family/Significant Other Suicide Prevention Education  Suicide Prevention Education:  Education Completed; [place] (mom) [phone],  (name of family member/significant other) has been identified by the patient as the family member/significant other with whom the patient will be residing, and identified as the person(s) who will aid the patient in the event of a mental health crisis (suicidal ideations/suicide attempt).  With written consent from the patient, the family member/significant other has been provided the following suicide prevention education, prior to the and/or following the discharge of the patient.  Safety planing was completed with mom . Mom states that patient sits in his car all day and night and she goes to bed early, so she does not know what time he comes in or what he be doing. Mom stated that all this was because his GF stated that she did not want to be with him anymore. Patient did not the news very well. Mom shared that his past GF and not seeing his kids are the biggest issue. States that once he thinks that he is feeling better, he will stop taking his medication. Confirmed that he does not own or have access to any guns but has a knife and machete, that mom shares that she does not know where it is; CSW did advise for mom to search his room and house and even ask patient where it is so they can be secured. Patient will return back with mom. Mom stated that patient may need a ride home, mom works 12 hours, but can leave a key under mate for him to get in. Mom continues on saying that she wanted patient to get well and learn to be happy. Also shared  that patient oldest brother committed suicide when patient was 42 years old and states that it has took a toll on him and part of reason for depression.     The suicide prevention education provided includes the following: Suicide risk factors Suicide prevention and interventions National Suicide Hotline  telephone number [facility] assessment telephone number [facility] Emergency Assistance 911 [facility] and/or Residential Mobile Crisis Unit telephone number  Request made of family/significant other to: Remove weapons (e.g., guns, rifles, knives), all items previously/currently identified as safety concern.   Remove drugs/medications (over-the-counter, prescriptions, illicit drugs), all items previously/currently identified as a safety concern.  The family member/significant other verbalizes understanding of the suicide prevention education information provided.  The family member/significant other agrees to remove the items of safety concern listed above.  Isabella Bowens 01/21/2023, 2:25 PM

## 2023-01-21 NOTE — [facility] Group Notes (Signed)
Adult Psychoeducational Group Note  Date:  01/21/2023 Time:  9:55 PM  Group Topic/Focus:  Wrap-Up Group:   The focus of this group is to help patients review their daily goal of treatment and discuss progress on daily workbooks.  Participation Level:  Active  Participation Quality:  Appropriate  Affect:  Appropriate  Cognitive:  Appropriate  Insight: Appropriate  Engagement in Group:  Engaged  Modes of Intervention:  Discussion  Additional Comments:   Pt states that he had a good day and was able to call his mother. Pt states that he has been working on mending their relationship. Pt endorsed anxiety and depression at a 6  Alanna Storti A Lorenda Hatchet 01/21/2023, 9:55 PM

## 2023-01-21 NOTE — Progress Notes (Signed)
   01/21/23 0600  15 Minute Checks  Location Bedroom  Visual Appearance Calm  Behavior Sleeping  Sleep (Behavioral Health Patients Only)  Calculate sleep? (Click Yes once per 24 hr at 0600 safety check) Yes  Documented sleep last 24 hours 7.5

## 2023-01-21 NOTE — Progress Notes (Signed)
[facility] MD Progress Note  01/21/2023 1:59 PM KHALIEL LENN  MRN:  [id number] Subjective:   ULISES BELLS is a 42 yr old male who presented to [facility] by [employer] under IVC on 7/29 due to SI with a plan to hang himself or be shot by the police, he was admitted to [facility] on 7/30.  PPHx is significant for Bipolar Disorder, Anxiety, Polysubstance Abuse (EtOH, Meth), and anger issues, and 1 Suicide Attempt (hanging 2013) and Multiple Psychiatric Hospitalizations (last [facility]- 09/2021).   Case was discussed in the multidisciplinary team. MAR was reviewed and patient was compliant with medications.   He received PRN Tylenol last night.   Psychiatric Team made the following recommendations yesterday: -Continue Abilify 5 mg -Continue Prozac 20 mg      On interview today patient reports he slept fair last night.  He reports his appetite is doing good.  He reports no SI, HI, or AVH.  He reports no Paranoia or Ideas of Reference.  He reports no issues with his medications.  He reports that he feels like his mood is improving and "smoothing out."  He reports no withdrawal symptoms.  He reports no cravings.  He reports having a headache but that he took some Tylenol earlier and this should help with it.  He reports some back pain/soreness.  He reports no other concerns at present.  Principal Problem: Bipolar I disorder, most recent episode depressed (HCC) Diagnosis: Principal Problem:   Bipolar I disorder, most recent episode depressed (HCC) Active Problems:   GAD (generalized anxiety disorder)   Alcohol use disorder, severe, dependence (HCC)   Tobacco use disorder  Total Time spent with patient:  I personally spent 35 minutes on the unit in direct patient care. The direct patient care time included face-to-face time with the patient, reviewing the patient's chart, communicating with other professionals, and coordinating care. Greater than 50% of this time was spent in counseling or coordinating care with the patient  regarding goals of hospitalization, psycho-education, and discharge planning needs.   Past Psychiatric History: Bipolar Disorder, Anxiety, Polysubstance Abuse (EtOH, Meth), and anger issues, and 1 Suicide Attempt (2013) and Multiple Psychiatric Hospitalizations (last [facility]- 09/2021).  Past Medical History:  Past Medical History:  Diagnosis Date   Anxiety    Depression    Hypertension    Migraines    Suicide attempt (HCC)    History reviewed. No pertinent surgical history. Family History: History reviewed. No pertinent family history. Family Psychiatric  History: Patient Refused to answer Social History:  Social History   Substance and Sexual Activity  Alcohol Use Yes   Alcohol/week: 12.0 standard drinks of alcohol   Types: 12 Cans of beer per week   Comment: binge drinking on weekends     Social History   Substance and Sexual Activity  Drug Use Yes   Frequency: 1.0 times per week   Types: Marijuana    Social History   Socioeconomic History   Marital status: Married    Spouse name: Not on file   Number of children: Not on file   Years of education: Not on file   Highest education level: Not on file  Occupational History   Not on file  Tobacco Use   Smoking status: Every Day    Current packs/day: 1.00    Types: Cigarettes   Smokeless tobacco: Current    Types: Chew  Substance and Sexual Activity   Alcohol use: Yes    Alcohol/week: 12.0 standard drinks of  alcohol    Types: 12 Cans of beer per week    Comment: binge drinking on weekends   Drug use: Yes    Frequency: 1.0 times per week    Types: Marijuana   Sexual activity: Not Currently    Birth control/protection: None  Other Topics Concern   Not on file  Social History Narrative   Not on file   Social Determinants of Health   Financial Resource Strain: Not on file  Food Insecurity: No Food Insecurity (01/18/2023)   Hunger Vital Sign    Worried About Running Out of Food in the Last Year: Never true    Ran  Out of Food in the Last Year: Never true  Transportation Needs: No Transportation Needs (01/18/2023)   PRAPARE - Administrator, Civil Service (Medical): No    Lack of Transportation (Non-Medical): No  Physical Activity: Not on file  Stress: Not on file  Social Connections: Not on file   Additional Social History:                         Sleep: Fair  Appetite:  Good  Current Medications: Current Facility-Administered Medications  Medication Dose Route Frequency Provider Last Rate Last Admin   acetaminophen (TYLENOL) tablet 650 mg  650 mg Oral Q6H PRN Dahlia Byes C, NP   650 mg at 01/21/23 0835   alum & mag hydroxide-simeth (MAALOX/MYLANTA) 200-200-20 MG/5ML suspension 30 mL  30 mL Oral Q4H PRN Dahlia Byes C, NP       ARIPiprazole (ABILIFY) tablet 5 mg  5 mg Oral Daily Massengill, Nathan, MD   5 mg at 01/21/23 3474   cloNIDine (CATAPRES) tablet 0.1 mg  0.1 mg Oral Q4H PRN Massengill, Harrold Donath, MD   0.1 mg at 01/21/23 2595   diphenhydrAMINE (BENADRYL) capsule 50 mg  50 mg Oral TID PRN Earney Navy, NP       Or   diphenhydrAMINE (BENADRYL) injection 50 mg  50 mg Intramuscular TID PRN Earney Navy, NP       FLUoxetine (PROZAC) capsule 20 mg  20 mg Oral Daily Dahlia Byes C, NP   20 mg at 01/21/23 6387   haloperidol (HALDOL) tablet 5 mg  5 mg Oral TID PRN Earney Navy, NP       Or   haloperidol lactate (HALDOL) injection 5 mg  5 mg Intramuscular TID PRN Earney Navy, NP       hydrOXYzine (ATARAX) tablet 25 mg  25 mg Oral TID PRN Massengill, Harrold Donath, MD       lisinopril (ZESTRIL) tablet 5 mg  5 mg Oral Daily Massengill, Nathan, MD   5 mg at 01/21/23 5643   loperamide (IMODIUM) capsule 2-4 mg  2-4 mg Oral PRN Massengill, Harrold Donath, MD       LORazepam (ATIVAN) tablet 2 mg  2 mg Oral TID PRN Earney Navy, NP       Or   LORazepam (ATIVAN) injection 2 mg  2 mg Intramuscular TID PRN Dahlia Byes C, NP       LORazepam  (ATIVAN) tablet 1 mg  1 mg Oral Q6H PRN Massengill, Nathan, MD       magnesium hydroxide (MILK OF MAGNESIA) suspension 30 mL  30 mL Oral Daily PRN Dahlia Byes C, NP       multivitamin with minerals tablet 1 tablet  1 tablet Oral Daily Massengill, Harrold Donath, MD   1 tablet at 01/21/23  4742   nicotine (NICODERM CQ - dosed in mg/24 hours) patch 14 mg  14 mg Transdermal Daily Massengill, Nathan, MD       nicotine polacrilex (NICORETTE) gum 2 mg  2 mg Oral PRN Massengill, Harrold Donath, MD       ondansetron (ZOFRAN-ODT) disintegrating tablet 4 mg  4 mg Oral Q6H PRN Massengill, Nathan, MD       thiamine (Vitamin B-1) tablet 100 mg  100 mg Oral Daily Massengill, Nathan, MD   100 mg at 01/21/23 0835   traZODone (DESYREL) tablet 50 mg  50 mg Oral QHS PRN Massengill, Harrold Donath, MD        Lab Results: No results found for this or any previous visit (from the past 48 hour(s)).  Blood Alcohol level:  Lab Results  Component Value Date   ETH 82 (H) 01/18/2023   ETH <10 10/13/2021    Metabolic Disorder Labs: Lab Results  Component Value Date   HGBA1C 5.3 09/10/2022   MPG 105 09/10/2022   MPG 102.54 10/17/2021   No results found for: "PROLACTIN" Lab Results  Component Value Date   CHOL 192 09/10/2022   TRIG 281 (H) 09/10/2022   HDL 42 09/10/2022   CHOLHDL 4.6 09/10/2022   VLDL 56 (H) 09/10/2022   LDLCALC 94 09/10/2022   LDLCALC 77 10/17/2021    Physical Findings: AIMS:  , ,  ,  ,    CIWA:  CIWA-Ar Total: 0 COWS:     Musculoskeletal: Strength & Muscle Tone: within normal limits Gait & Station:  laying in bed during interview Patient leans: N/A  Psychiatric Specialty Exam:  Presentation  General Appearance:  Disheveled  Eye Contact: Fair  Speech: Clear and Coherent; Normal Rate  Speech Volume: Decreased  Handedness: Right   Mood and Affect  Mood: Depressed  Affect: Congruent; Depressed; Constricted   Thought Process  Thought Processes: Coherent  Descriptions of  Associations:Intact  Orientation:Full (Time, Place and Person)  Thought Content:Logical; WDL  History of Schizophrenia/Schizoaffective disorder:No  Duration of Psychotic Symptoms:No data recorded Hallucinations:Hallucinations: None  Ideas of Reference:None  Suicidal Thoughts:Suicidal Thoughts: No  Homicidal Thoughts:Homicidal Thoughts: No   Sensorium  Memory: Immediate Fair  Judgment: Intact  Insight: Present   Executive Functions  Concentration: Fair  Attention Span: Fair  Recall: Fair  Fund of Knowledge: Fair  Language: Good   Psychomotor Activity  Psychomotor Activity:Psychomotor Activity: Normal   Assets  Assets: Resilience   Sleep  Sleep:Sleep: Fair    Physical Exam: Physical Exam Vitals and nursing note reviewed.  Constitutional:      General: He is not in acute distress.    Appearance: Normal appearance. He is normal weight. He is not ill-appearing or toxic-appearing.  HENT:     Head: Normocephalic and atraumatic.  Pulmonary:     Effort: Pulmonary effort is normal.  Musculoskeletal:        General: Normal range of motion.  Neurological:     General: No focal deficit present.     Mental Status: He is alert.    Review of Systems  Respiratory:  Negative for cough and shortness of breath.   Cardiovascular:  Negative for chest pain.  Gastrointestinal:  Negative for abdominal pain, constipation, diarrhea, nausea and vomiting.  Neurological:  Positive for headaches. Negative for dizziness and weakness.  Psychiatric/Behavioral:  Positive for depression. Negative for hallucinations and suicidal ideas. The patient is not nervous/anxious.    Blood pressure (!) 134/105, pulse 64, temperature 98.1 F (36.7 C), temperature source  Oral, resp. rate 16, height 5\' 9"  (1.753 m), weight 74.4 kg, SpO2 100%. Body mass index is 24.22 kg/m.   Treatment Plan Summary: Daily contact with patient to assess and evaluate symptoms and progress in  treatment and Medication management  ZECHARIA HIMMELBERG is a 42 yr old male who presented to [facility] by [facility] under IVC on 7/29 due to SI with a plan to hang himself or be shot by the police, he was admitted to [facility] on 7/30.  PPHx is significant for Bipolar Disorder, Anxiety, Polysubstance Abuse (EtOH, Meth), and anger issues, and 1 Suicide Attempt (2013) and Multiple Psychiatric Hospitalizations (last [facility]- 09/2021).   Maximillion appears to be responding to the medications as he was able to conduct the interview today.  He has been taking his medications without issue.  We will not make any changes to his medications at this time.  If he continues to isolate to his room we may need to place a lockout order tomorrow.  We will continue to monitor.   Bipolar Disorder, Current Depressed, Severe: -Continue Abilify 5 mg daily for mood stability and augmentation -Continue Prozac 20 mg daily for depression -Continue Agitation Protocol: Haldol/Ativan/Benadryl   Nicotine Dependence: -Continue Nicotine Patch 14 mg daily -Continue Nicotine Gum 2 mg PRN   Withdrawal: -Continue CIWA, last score= 0  @ 1200  8/1 -Continue Ativan 1 mg q6 PRN CIWA> 10 -Continue Imodium 2-4 mg PRN diarrhea -Continue Zofran-ODT 4 mg q6 PRN nausea -Continue Thiamine 100 mg daily for supplementation -Continue Multivitamin daily for nutritional supplementation   HTN: -Continue Lisinopril 5 mg daily -Continue Clonidine 0.1 mg q4 PRN   -Continue PRN's: Tylenol, Maalox, Atarax, Milk of Magnesia, Trazodone   Lauro Franklin, MD 01/21/2023, 1:59 PM

## 2023-01-21 NOTE — Progress Notes (Signed)
   01/21/23 1644  Psych Admission Type (Psych Patients Only)  Admission Status Involuntary  Psychosocial Assessment  Patient Complaints Anxiety;Depression;Irritability  Eye Contact Brief  Facial Expression Flat  Affect Anxious;Irritable  Speech Logical/coherent  Interaction Assertive  Motor Activity Other (Comment) (WNL)  Appearance/Hygiene Disheveled  Behavior Characteristics Cooperative;Irritable  Mood Irritable  Thought Process  Coherency WDL  Content WDL  Delusions None reported or observed  Perception WDL  Hallucination None reported or observed  Judgment Poor  Confusion None  Danger to Self  Current suicidal ideation? Denies  Agreement Not to Harm Self Yes  Description of Agreement verbal  Danger to Others  Danger to Others None reported or observed

## 2023-01-21 NOTE — Progress Notes (Signed)
   01/20/23 2200  Psych Admission Type (Psych Patients Only)  Admission Status Involuntary  Psychosocial Assessment  Patient Complaints Depression;Irritability  Eye Contact Brief  Facial Expression Flat  Affect Depressed  Speech Logical/coherent  Interaction Assertive  Motor Activity Slow  Appearance/Hygiene Disheveled  Behavior Characteristics Resistant to care;Unwilling to participate  Mood Depressed;Irritable  Thought Process  Coherency WDL  Content WDL  Delusions None reported or observed  Perception WDL  Hallucination None reported or observed  Judgment Poor  Confusion None  Danger to Self  Current suicidal ideation? Denies  Agreement Not to Harm Self Yes  Description of Agreement verbal  Danger to Others  Danger to Others None reported or observed

## 2023-01-22 DIAGNOSIS — F3130 Bipolar disorder, current episode depressed, mild or moderate severity, unspecified: Secondary | ICD-10-CM | POA: Diagnosis not present

## 2023-01-22 MED ORDER — LISINOPRIL 10 MG PO TABS
10.0000 mg | ORAL_TABLET | Freq: Every day | ORAL | Status: DC
  Administered 2023-01-23: 10 mg via ORAL
  Filled 2023-01-22: qty 1
  Filled 2023-01-22: qty 2
  Filled 2023-01-22 (×2): qty 1

## 2023-01-22 NOTE — Progress Notes (Signed)
   01/22/23 0800  Psych Admission Type (Psych Patients Only)  Admission Status Involuntary  Psychosocial Assessment  Patient Complaints Anxiety;Depression  Eye Contact Brief  Facial Expression Flat;Sad  Affect Anxious;Depressed  Speech Logical/coherent  Interaction Assertive  Motor Activity Other (Comment) (wnl)  Appearance/Hygiene Unremarkable  Behavior Characteristics Cooperative;Appropriate to situation  Mood Depressed;Anxious  Aggressive Behavior  Effect No apparent injury  Thought Process  Coherency WDL  Content WDL  Delusions None reported or observed  Perception WDL  Hallucination None reported or observed  Judgment Poor  Confusion None  Danger to Self  Current suicidal ideation? Denies  Agreement Not to Harm Self Yes  Description of Agreement Verbally contracts for safety  Danger to Others  Danger to Others None reported or observed

## 2023-01-22 NOTE — Progress Notes (Signed)
Adult Psychoeducational Group Note  Date:  01/22/2023 Time:  11:35 PM  Group Topic/Focus:  Wrap-Up Group:   The focus of this group is to help patients review their daily goal of treatment and discuss progress on daily workbooks.  Participation Level:  Active  Participation Quality:  Appropriate  Affect:  Appropriate  Cognitive:  Appropriate  Insight: Appropriate  Engagement in Group:  Engaged  Modes of Intervention:  Discussion and Support  Additional Comments:  Pt states goal today, was to try to stay awake. Pt states not quite achieving goal because meds make him tired. Pt states having some relationship problems and trying to build relationship with kids.  Shawn Schaefer Shawn Schaefer 01/22/2023, 11:35 PM

## 2023-01-22 NOTE — Progress Notes (Addendum)
[facility] MD Progress Note  01/22/2023 3:43 PM Shawn Schaefer  MRN:  [id number]  Subjective:   Shawn Schaefer is a 42 yr old male who presented to [facility] by [employer] under IVC on 7/29 due to SI with a plan to hang himself or be shot by the police, he was admitted to [facility] on 7/30.  PPHx is significant for Bipolar Disorder, Anxiety, Polysubstance Abuse (EtOH, Meth), and anger issues, and 1 Suicide Attempt (hanging 2013) and Multiple Psychiatric Hospitalizations (last [facility]- 09/2021).   Yesterday the psychiatry team made the following recommendations:   On assessment today, the pt reports that their mood is anxious, improved since admission. His is less irritable.  Denies feeling down, depressed, or sad.  Sleep is better. Appetite is stable.  Concentration is without complaint.  Energy level is adequate. Denies having any suicidal thoughts. Denies having any suicidal intent and plan.  Denies having any HI.  Denies having psychotic symptoms.   Denies having side effects to current psychiatric medications.   Discussed discharge planning for tomorrow to care of mother, with whom the social worker has done safety planning with.   Reports w/d sx are gone.       Principal Problem: Bipolar I disorder, most recent episode depressed (HCC) Diagnosis: Principal Problem:   Bipolar I disorder, most recent episode depressed (HCC) Active Problems:   GAD (generalized anxiety disorder)   Alcohol use disorder, severe, dependence (HCC)   Tobacco use disorder  Total Time spent with patient: 15 minutes  Past Psychiatric History: See H&P   Past Medical History:  Past Medical History:  Diagnosis Date   Anxiety    Depression    Hypertension    Migraines    Suicide attempt (HCC)    History reviewed. No pertinent surgical history. Family History: History reviewed. No pertinent family history. Family Psychiatric  History: See H&P  Social History:  Social History   Substance and Sexual Activity  Alcohol Use  Yes   Alcohol/week: 12.0 standard drinks of alcohol   Types: 12 Cans of beer per week   Comment: binge drinking on weekends     Social History   Substance and Sexual Activity  Drug Use Yes   Frequency: 1.0 times per week   Types: Marijuana    Social History   Socioeconomic History   Marital status: Married    Spouse name: Not on file   Number of children: Not on file   Years of education: Not on file   Highest education level: Not on file  Occupational History   Not on file  Tobacco Use   Smoking status: Every Day    Current packs/day: 1.00    Types: Cigarettes   Smokeless tobacco: Current    Types: Chew  Substance and Sexual Activity   Alcohol use: Yes    Alcohol/week: 12.0 standard drinks of alcohol    Types: 12 Cans of beer per week    Comment: binge drinking on weekends   Drug use: Yes    Frequency: 1.0 times per week    Types: Marijuana   Sexual activity: Not Currently    Birth control/protection: None  Other Topics Concern   Not on file  Social History Narrative   Not on file   Social Determinants of Health   Financial Resource Strain: Not on file  Food Insecurity: No Food Insecurity (01/18/2023)   Hunger Vital Sign    Worried About Running Out of Food in the Last Year: Never true  Ran Out of Food in the Last Year: Never true  Transportation Needs: No Transportation Needs (01/18/2023)   PRAPARE - Administrator, Civil Service (Medical): No    Lack of Transportation (Non-Medical): No  Physical Activity: Not on file  Stress: Not on file  Social Connections: Not on file   Additional Social History:                          Current Medications: Current Facility-Administered Medications  Medication Dose Route Frequency Provider Last Rate Last Admin   acetaminophen (TYLENOL) tablet 650 mg  650 mg Oral Q6H PRN Dahlia Byes C, NP   650 mg at 01/22/23 0807   alum & mag hydroxide-simeth (MAALOX/MYLANTA) 200-200-20 MG/5ML  suspension 30 mL  30 mL Oral Q4H PRN Dahlia Byes C, NP       ARIPiprazole (ABILIFY) tablet 5 mg  5 mg Oral Daily Totiana Everson, MD   5 mg at 01/22/23 6213   cloNIDine (CATAPRES) tablet 0.1 mg  0.1 mg Oral Q4H PRN Phineas Inches, MD   0.1 mg at 01/22/23 0640   diphenhydrAMINE (BENADRYL) capsule 50 mg  50 mg Oral TID PRN Earney Navy, NP       Or   diphenhydrAMINE (BENADRYL) injection 50 mg  50 mg Intramuscular TID PRN Earney Navy, NP       FLUoxetine (PROZAC) capsule 20 mg  20 mg Oral Daily Dahlia Byes C, NP   20 mg at 01/22/23 0865   haloperidol (HALDOL) tablet 5 mg  5 mg Oral TID PRN Earney Navy, NP       Or   haloperidol lactate (HALDOL) injection 5 mg  5 mg Intramuscular TID PRN Earney Navy, NP       hydrOXYzine (ATARAX) tablet 25 mg  25 mg Oral TID PRN Phineas Inches, MD       Melene Muller ON 01/23/2023] lisinopril (ZESTRIL) tablet 10 mg  10 mg Oral Daily Adrien Dietzman, MD       loperamide (IMODIUM) capsule 2-4 mg  2-4 mg Oral PRN Jody Aguinaga, Harrold Donath, MD       LORazepam (ATIVAN) tablet 2 mg  2 mg Oral TID PRN Earney Navy, NP       Or   LORazepam (ATIVAN) injection 2 mg  2 mg Intramuscular TID PRN Earney Navy, NP       LORazepam (ATIVAN) tablet 1 mg  1 mg Oral Q6H PRN Blanche Scovell, MD       magnesium hydroxide (MILK OF MAGNESIA) suspension 30 mL  30 mL Oral Daily PRN Earney Navy, NP       multivitamin with minerals tablet 1 tablet  1 tablet Oral Daily Teondra Newburg, MD   1 tablet at 01/22/23 7846   nicotine (NICODERM CQ - dosed in mg/24 hours) patch 14 mg  14 mg Transdermal Daily Finas Delone, MD       nicotine polacrilex (NICORETTE) gum 2 mg  2 mg Oral PRN Sinjin Amero, Harrold Donath, MD       ondansetron (ZOFRAN-ODT) disintegrating tablet 4 mg  4 mg Oral Q6H PRN Ethel Veronica, MD       thiamine (Vitamin B-1) tablet 100 mg  100 mg Oral Daily Rhylan Kagel, MD   100 mg at 01/22/23 0809   traZODone  (DESYREL) tablet 50 mg  50 mg Oral QHS PRN Phineas Inches, MD        Lab Results: No results  found for this or any previous visit (from the past 48 hour(s)).  Blood Alcohol level:  Lab Results  Component Value Date   ETH 82 (H) 01/18/2023   ETH <10 10/13/2021    Metabolic Disorder Labs: Lab Results  Component Value Date   HGBA1C 5.3 09/10/2022   MPG 105 09/10/2022   MPG 102.54 10/17/2021   No results found for: "PROLACTIN" Lab Results  Component Value Date   CHOL 192 09/10/2022   TRIG 281 (H) 09/10/2022   HDL 42 09/10/2022   CHOLHDL 4.6 09/10/2022   VLDL 56 (H) 09/10/2022   LDLCALC 94 09/10/2022   LDLCALC 77 10/17/2021    Physical Findings: AIMS:  , ,  ,  ,    CIWA:  CIWA-Ar Total: 1 COWS:     Musculoskeletal: Strength & Muscle Tone: within normal limits Gait & Station: normal Patient leans: N/A  Psychiatric Specialty Exam:  Presentation  General Appearance:  Casual  Eye Contact: Fair  Speech: Normal Rate  Speech Volume: Normal  Handedness: Right   Mood and Affect  Mood: Anxious; Irritable  Affect: Congruent   Thought Process  Thought Processes: Linear  Descriptions of Associations:Intact  Orientation:Full (Time, Place and Person)  Thought Content:Logical  History of Schizophrenia/Schizoaffective disorder:No  Duration of Psychotic Symptoms:No data recorded Hallucinations:Hallucinations: None  Ideas of Reference:None  Suicidal Thoughts:Suicidal Thoughts: No  Homicidal Thoughts:Homicidal Thoughts: No   Sensorium  Memory: Immediate Fair  Judgment: Intact  Insight: Present   Executive Functions  Concentration: Fair  Attention Span: Fair  Recall: [employer] of Knowledge: Fair  Language: Good   Psychomotor Activity  Psychomotor Activity: Psychomotor Activity: Normal   Assets  Assets: Resilience   Sleep  Sleep: Sleep: Fair    Physical Exam: Physical Exam Vitals reviewed.   Constitutional:      General: He is not in acute distress.    Appearance: He is normal weight. He is not toxic-appearing.  Pulmonary:     Effort: Pulmonary effort is normal. No respiratory distress.  Neurological:     Mental Status: He is alert.     Motor: No weakness.     Gait: Gait normal.  Psychiatric:        Behavior: Behavior normal.        Thought Content: Thought content normal.    Review of Systems  Constitutional:  Negative for chills and fever.  Cardiovascular:  Negative for chest pain and palpitations.  Neurological:  Negative for dizziness, tingling, tremors and headaches.  Psychiatric/Behavioral:  Positive for substance abuse. Negative for depression, hallucinations, memory loss and suicidal ideas. The patient is nervous/anxious. The patient does not have insomnia.   All other systems reviewed and are negative.  Blood pressure (!) 142/103, pulse 69, temperature 98.2 F (36.8 C), temperature source Oral, resp. rate 16, height 5\' 9"  (1.753 m), weight 74.4 kg, SpO2 98%. Body mass index is 24.22 kg/m.   Treatment Plan Summary: Daily contact with patient to assess and evaluate symptoms and progress in treatment and Medication management  Daily contact with patient to assess and evaluate symptoms and progress in treatment and Medication management   Shawn Schaefer is a 42 yr old male who presented to [facility] by [employer] under IVC on 7/29 due to SI with a plan to hang himself or be shot by the police, he was admitted to [facility] on 7/30.  PPHx is significant for Bipolar Disorder, Anxiety, Polysubstance Abuse (EtOH, Meth), and anger issues, and 1 Suicide Attempt (2013) and  Multiple Psychiatric Hospitalizations (last [facility]- 09/2021).     Jefferey appears to be responding to the medications as he was able to conduct the interview today.  He has been taking his medications without issue.  We will not make any changes to his medications at this time.  If he continues to isolate to his room we may  need to place a lockout order tomorrow.  We will continue to monitor.     Bipolar Disorder, Current Depressed, Severe: -Continue Abilify 5 mg daily for mood stability and augmentation -Continue Prozac 20 mg daily for depression -Continue Agitation Protocol: Haldol/Ativan/Benadryl     Nicotine Dependence: -Continue Nicotine Patch 14 mg daily -Continue Nicotine Gum 2 mg PRN     Withdrawal: -Continue CIWA, last score= 0  @ 1200  8/1 -Continue Ativan 1 mg q6 PRN CIWA> 10 -Continue Imodium 2-4 mg PRN diarrhea -Continue Zofran-ODT 4 mg q6 PRN nausea -Continue Thiamine 100 mg daily for supplementation -Continue Multivitamin daily for nutritional supplementation     HTN: -Continue Lisinopril 5 mg daily -Continue Clonidine 0.1 mg q4 PRN     -Continue PRN's: Tylenol, Maalox, Atarax, Milk of Magnesia, Trazodone    Cristy Hilts, MD 01/22/2023, 3:43 PM  Total Time Spent in Direct Patient Care:  I personally spent 25 minutes on the unit in direct patient care. The direct patient care time included face-to-face time with the patient, reviewing the patient's chart, communicating with other professionals, and coordinating care. Greater than 50% of this time was spent in counseling or coordinating care with the patient regarding goals of hospitalization, psycho-education, and discharge planning needs.   Phineas Inches, MD Psychiatrist

## 2023-01-22 NOTE — Progress Notes (Signed)
   01/22/23 0600  15 Minute Checks  Location Bedroom  Visual Appearance Calm  Behavior Sleeping  Sleep (Behavioral Health Patients Only)  Calculate sleep? (Click Yes once per 24 hr at 0600 safety check) Yes  Documented sleep last 24 hours 9.75

## 2023-01-22 NOTE — [facility] Group Notes (Signed)
[facility] Group Notes: (Healthy Relationships)  Date: 01/22/23  Time: 2:45pm-3:00pm  Group Topic: Healthy Relationships: the focus of this group is to help patients identify healthy relationships versus unhealthy relationships characteristics.   Participation Level: Did not attend  Participation Quality: Did not attend  Affect: n/a  Cognitive: n/a  Insight: n/a  Engagement in group: n/a  Modes of intervention: Discussion  Summary of Progress/Problems: Patient did not attend group today.  Harless Litten RN   01/22/23

## 2023-01-22 NOTE — Group Note (Signed)
Recreation Therapy Group Note   Group Topic:Problem Solving  Group Date: 01/22/2023 Start Time: 1610 End Time: 1005 Facilitators: Alpheus Stiff-McCall, LRT,CTRS Location: [address] Dayroom   Goal Area(s) Addresses:  Patient will effectively work in a team with other group members. Patient will verbalize importance of using appropriate problem solving techniques.  Patient will identify positive change associated with effective problem solving skills.    Group Description: Brain Teasers. Patients form groups of no more than 3 people. Patients given two sheets of brain teasers each. Patients work together to figure out the answers to each puzzle. Patients given 20 minutes to work on the brain teasers. LRT will go over the final answers with patients.   Affect/Mood: N/A   Participation Level: Did not attend    Clinical Observations/Individualized Feedback:     Plan: Continue to engage patient in RT group sessions 2-3x/week.   Makiah Foye-McCall, LRT,CTRS 01/22/2023 1:11 PM

## 2023-01-22 NOTE — Plan of Care (Signed)

## 2023-01-22 NOTE — Progress Notes (Signed)
   01/22/23 2215  Psych Admission Type (Psych Patients Only)  Admission Status Involuntary  Psychosocial Assessment  Patient Complaints Anxiety;Depression  Eye Contact Brief  Facial Expression Flat;Sad  Affect Anxious;Depressed  Speech Logical/coherent  Interaction Assertive  Motor Activity Other (Comment) (WNL)  Appearance/Hygiene Unremarkable  Behavior Characteristics Cooperative;Appropriate to situation  Mood Depressed;Anxious  Aggressive Behavior  Effect No apparent injury  Thought Process  Coherency WDL  Content WDL  Delusions None reported or observed  Perception WDL  Hallucination None reported or observed  Judgment Poor  Confusion None  Danger to Self  Current suicidal ideation? Denies  Agreement Not to Harm Self Yes  Description of Agreement verbal  Danger to Others  Danger to Others None reported or observed

## 2023-01-23 DIAGNOSIS — F3130 Bipolar disorder, current episode depressed, mild or moderate severity, unspecified: Principal | ICD-10-CM

## 2023-01-23 MED ORDER — ARIPIPRAZOLE 5 MG PO TABS: 5.0000 mg | ORAL_TABLET | Freq: Every day | ORAL | 0 refills | Status: AC

## 2023-01-23 MED ORDER — FLUOXETINE HCL 20 MG PO CAPS: 20.0000 mg | ORAL_CAPSULE | Freq: Every day | ORAL | 0 refills | Status: AC

## 2023-01-23 MED ORDER — LISINOPRIL 10 MG PO TABS: 10.0000 mg | ORAL_TABLET | Freq: Every day | ORAL | 0 refills | Status: AC

## 2023-01-23 MED ORDER — NICOTINE 14 MG/24HR TD PT24: 14.0000 mg | MEDICATED_PATCH | Freq: Every day | TRANSDERMAL | 0 refills | Status: AC

## 2023-01-23 MED ORDER — NICOTINE POLACRILEX 2 MG MT GUM: 2.0000 mg | CHEWING_GUM | OROMUCOSAL | 0 refills | Status: AC | PRN

## 2023-01-23 NOTE — Discharge Instructions (Signed)

## 2023-01-23 NOTE — Progress Notes (Signed)
   01/23/23 0900  Psych Admission Type (Psych Patients Only)  Admission Status Involuntary  Psychosocial Assessment  Patient Complaints None  Eye Contact Brief  Facial Expression Sad  Affect Sad  Speech Logical/coherent  Interaction Assertive  Motor Activity Other (Comment) (steady gait)  Appearance/Hygiene Unremarkable  Behavior Characteristics Cooperative;Appropriate to situation  Mood Sad  Aggressive Behavior  Effect No apparent injury  Thought Process  Coherency WDL  Content WDL  Delusions None reported or observed  Perception WDL  Hallucination None reported or observed  Judgment Poor  Confusion None  Danger to Self  Current suicidal ideation? Denies  Agreement Not to Harm Self Yes  Description of Agreement agreed to contact staff before acting on harmful thoughts  Danger to Others  Danger to Others None reported or observed

## 2023-01-23 NOTE — Plan of Care (Signed)
  Problem: Education: Goal: Knowledge of [facility] Education information/materials will improve Outcome: Progressing Goal: Emotional status will improve Outcome: Progressing Goal: Mental status will improve Outcome: Progressing Goal: Verbalization of understanding the information provided will improve Outcome: Progressing   

## 2023-01-23 NOTE — Group Note (Unsigned)
Date:  01/23/2023 Time:  9:12 AM  Group Topic/Focus:  Goals Group:   The focus of this group is to help patients establish daily goals to achieve during treatment and discuss how the patient can incorporate goal setting into their daily lives to aide in recovery. Orientation:   The focus of this group is to educate the patient on the purpose and policies of crisis stabilization and provide a format to answer questions about their admission.  The group details unit policies and expectations of patients while admitted.     Participation Level:  {[facility] PARTICIPATION HYQMV:78469}  Participation Quality:  {[facility] PARTICIPATION QUALITY:22265}  Affect:  {[facility] AFFECT:22266}  Cognitive:  {[facility] COGNITIVE:22267}  Insight: {[facility] Insight2:20797}  Engagement in Group:  {[facility] ENGAGEMENT IN GEXBM:84132}  Modes of Intervention:  {[facility] MODES OF INTERVENTION:22269}  Additional Comments:  ***  Shawn Schaefer 01/23/2023, 9:12 AM

## 2023-01-23 NOTE — [facility] Suicide Risk Assessment (Signed)
[facility] Discharge Suicide Risk Assessment   Principal Problem: Bipolar I disorder, most recent episode depressed (HCC) Discharge Diagnoses: Principal Problem:   Bipolar I disorder, most recent episode depressed (HCC) Active Problems:   GAD (generalized anxiety disorder)   Alcohol use disorder, severe, dependence (HCC)   Tobacco use disorder   Total Time spent with patient: 20 minutes  Shawn "Arlys John" Schaefer is a 42 year old male with history of bipolar disorder, alcohol abuse, anger issues, anxiety, depression, presenting to the ED on 7/29 under IVC petition by police for acute suicidality.     During the patient's hospitalization, patient had extensive initial psychiatric evaluation, and follow-up psychiatric evaluations every day.   Psychiatric diagnoses provided upon initial assessment:  - Bipolar 1, current depression phase severe. - Alcohol use disorder - Stimulant use disorder (methamphetamines)   Patient's psychiatric medications were adjusted on admission:      --Start abilify 5 mg for Bipolar 1             -- Start fluoxetine 20 mg for Bipolar depression             -- Start hydroxyzine TID PRN for anxiety             -- Start nicotine replacement therapy (patch and gum both)             -- Start trazodone 50 for sleep   During the hospitalization, other adjustments were made to the patient's medication regimen:  -started lisinopril for HTN    Patient's care was discussed during the interdisciplinary team meeting every day during the hospitalization.   The patient denied having side effects to prescribed psychiatric medication.   Gradually, patient started adjusting to milieu. The patient was evaluated each day by a clinical provider to ascertain response to treatment. Improvement was noted by the patient's report of decreasing symptoms, improved sleep and appetite, affect, medication tolerance, behavior, and participation in unit programming.  Patient was asked each day to  complete a self inventory noting mood, mental status, pain, new symptoms, anxiety and concerns.     Symptoms were reported as significantly decreased or resolved completely by discharge.    On day of discharge, the patient reports that their mood is stable. The patient denied having suicidal thoughts for more than 48 hours prior to discharge.  Patient denies having homicidal thoughts.  Patient denies having auditory hallucinations.  Patient denies any visual hallucinations or other symptoms of psychosis. The patient was motivated to continue taking medication with a goal of continued improvement in mental health.    The patient reports their target psychiatric symptoms of depression, substance withdrawal symptoms, and suicidal thoughts, all responded well to the psychiatric medications, and the patient reports overall benefit other psychiatric hospitalization. Supportive psychotherapy was provided to the patient. The patient also participated in regular group therapy while hospitalized. Coping skills, problem solving as well as relaxation therapies were also part of the unit programming.   Labs were reviewed with the patient, and abnormal results were discussed with the patient.   The patient is able to verbalize their individual safety plan to this provider.   # It is recommended to the patient to continue psychiatric medications as prescribed, after discharge from the hospital.     # It is recommended to the patient to follow up with your outpatient psychiatric provider and PCP.   # It was discussed with the patient, the impact of alcohol, drugs, tobacco have been there overall psychiatric and medical wellbeing,  and total abstinence from substance use was recommended the patient.ed.   # Prescriptions provided or sent directly to preferred pharmacy at discharge. Patient agreeable to plan. Given opportunity to ask questions. Appears to feel comfortable with discharge.    # In the event of  worsening symptoms, the patient is instructed to call the crisis hotline, 911 and or go to the nearest ED for appropriate evaluation and treatment of symptoms. To follow-up with primary care provider for other medical issues, concerns and or health care needs   # Patient was discharged to care of mother, with a plan to follow up as noted below.       Psychiatric Specialty Exam  Presentation  General Appearance:  Appropriate for Environment; Casual; Fairly Groomed  Eye Contact: Good  Speech: Normal Rate; Clear and Coherent  Speech Volume: Normal  Handedness: Right   Mood and Affect  Mood: Euthymic  Duration of Depression Symptoms: Less than two weeks  Affect: Appropriate; Congruent; Full Range   Thought Process  Thought Processes: Linear  Descriptions of Associations:Intact  Orientation:Full (Time, Place and Person)  Thought Content:Logical  History of Schizophrenia/Schizoaffective disorder:No  Duration of Psychotic Symptoms:No data recorded Hallucinations:Hallucinations: None  Ideas of Reference:None  Suicidal Thoughts:Suicidal Thoughts: No  Homicidal Thoughts:Homicidal Thoughts: No   Sensorium  Memory: Immediate Good; Recent Good; Remote Good  Judgment: Intact  Insight: Present   Executive Functions  Concentration: Good  Attention Span: Fair  Recall: Good  Fund of Knowledge: Good  Language: Good   Psychomotor Activity  Psychomotor Activity: Psychomotor Activity: Normal   Assets  Assets: Resilience   Sleep  Sleep: Sleep: Fair   Physical Exam: Physical Exam See discharge summary  ROS See discharge summary  Blood pressure 116/79, pulse (!) 59, temperature 98.2 F (36.8 C), temperature source Oral, resp. rate 20, height 5\' 9"  (1.753 m), weight 74.4 kg, SpO2 99%. Body mass index is 24.22 kg/m.  Mental Status Per Nursing Assessment::   On Admission:  NA  Demographic factors:  Male, Caucasian Loss Factors:   NA Historical Factors:  Prior suicide attempts Risk Reduction Factors:  Sense of responsibility to family, Positive social support    Continued Clinical Symptoms:  Mood is stable. Denies drug w/d symptoms. Denies any SI or HI.   Cognitive Features That Contribute To Risk:  None    Suicide Risk:  Mild:  There are no identifiable suicide plans, no associated intent, mild dysphoria and related symptoms, good self-control (both objective and subjective assessment), few other risk factors, and identifiable protective factors, including available and accessible social support.    Follow-up Information     [facility] Go on 02/16/2023.   Why: You have an appointment for medication management services on 02/16/23 at 4:00 pm.  This appointment will be held in person. Contact information: [address] [place] [address] [phone]         [facility]. Schedule an appointment as soon as possible for a visit.   Why: Please call to personally schedule (per provider request) an appointment for therapy services. Contact information: [address] [place], [place] [address] [phone]                 Plan Of Care/Follow-up recommendations:   -Follow-up with your outpatient psychiatric provider -instructions on appointment date, time, and address (location) are provided to you in discharge paperwork.   -Take your psychiatric medications as prescribed at discharge - instructions  are provided to you in the discharge paperwork   -Follow-up with outpatient primary care doctor and other specialists -for management of preventative medicine and chronic medical disease   -If you are prescribed an atypical antipsychotic medication, we recommend that your outpatient psychiatrist follow routine screening for side effects within 3 months of discharge, including monitoring: AIMS scale, height, weight, blood pressure,  fasting lipid panel, HbA1c, and fasting blood sugar.    -Recommend total abstinence from alcohol, tobacco, and other illicit drug use at discharge.    -If your psychiatric symptoms recur, worsen, or if you have side effects to your psychiatric medications, call your outpatient psychiatric provider, 911, [phone] or go to the nearest emergency department.   -If suicidal thoughts occur, immediately call your outpatient psychiatric provider, 911, [phone] or go to the nearest emergency department.      Cristy Hilts, MD 01/23/2023, 10:15 AM

## 2023-01-23 NOTE — Progress Notes (Signed)
Pt discharged to lobby. Pt was stable and appreciative at that time. All papers and prescriptions were given and valuables returned. Suicide safety plan completed. Verbal understanding expressed. Denies SI/HI and A/VH. Pt given opportunity to express concerns and ask questions.

## 2023-01-23 NOTE — Progress Notes (Addendum)
  [facility] Adult Case Management Discharge Plan :  Will you be returning to the same living situation after discharge:  Yes,  Pt reported that he will be returning with his mother Terrace Arabia [phone] At discharge, do you have transportation home?: Yes,  Pt reported that his mother will pick him up today at noon Do you have the ability to pay for your medications: Yes,  Pt has [employer] health insurance  Release of information consent forms completed and in the chart;  Patient's signature needed at discharge.  Patient to Follow up at:  Follow-up Information     [facility] Go on 02/16/2023.   Why: You have an appointment for medication management services on 02/16/23 at 4:00 pm.  This appointment will be held in person. Contact information: [address] [place] [address] [phone]         [facility]. Schedule an appointment as soon as possible for a visit.   Why: Please call to personally schedule (per provider request) an appointment for therapy services. Contact information: [address] [place], [place] [address] [phone]                 Next level of care provider has access to [facility] Link:no  Safety Planning and Suicide Prevention discussed: Yes,  CSW spoke with mother Terrace Arabia [phone] . CSW spoke with patient 8.3.24 Pt reported that he will be returning with his mother Terrace Arabia [phone]. No safety concerns reported  Has patient been referred to the Quitline?: Yes e-referral complete., per patients request  Patient has not been referred for addiction treatment: The pt has been referred for medication management, therapy and [facility] for tobacco, pt reluctant to accept for alcohol substance treatment.    Steffanie Dunn, LCSWA 01/23/2023, 10:32 AM
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
## 2024-03-16 ENCOUNTER — Other Ambulatory Visit: Payer: Self-pay

## 2024-03-16 ENCOUNTER — Emergency Department (HOSPITAL_COMMUNITY)
Admission: EM | Admit: 2024-03-16 | Discharge: 2024-03-16 | Disposition: A | Discharge status: Home / Self Care | Payer: Self-pay | Attending: Emergency Medicine | Admitting: Emergency Medicine

## 2024-03-16 ENCOUNTER — Encounter (HOSPITAL_COMMUNITY): Payer: Self-pay | Admitting: Emergency Medicine

## 2024-03-16 ENCOUNTER — Emergency Department (HOSPITAL_COMMUNITY): Payer: Self-pay

## 2024-03-16 DIAGNOSIS — R079 Chest pain, unspecified: Secondary | ICD-10-CM

## 2024-03-16 DIAGNOSIS — R0600 Dyspnea, unspecified: Secondary | ICD-10-CM | POA: Insufficient documentation

## 2024-03-16 DIAGNOSIS — R0789 Other chest pain: Secondary | ICD-10-CM | POA: Insufficient documentation

## 2024-03-16 LAB — BASIC METABOLIC PANEL WITH GFR
Anion gap: 11 (ref 5–15)
BUN: 18 mg/dL (ref 6–20)
CO2: 24 mmol/L (ref 22–32)
Calcium: 8.9 mg/dL (ref 8.9–10.3)
Chloride: 104 mmol/L (ref 98–111)
Creatinine, Ser: 0.93 mg/dL (ref 0.61–1.24)
GFR, Estimated: >60 mL/min (ref >60)
Glucose, Bld: 136 mg/dL — ABNORMAL HIGH (ref 70–99)
Potassium: 4.4 mmol/L (ref 3.5–5.1)
Sodium: 139 mmol/L (ref 135–145)

## 2024-03-16 LAB — CBC
HCT: 45.6 % (ref 39.0–52.0)
Hemoglobin: 15.3 g/dL (ref 13.0–17.0)
MCH: 30.2 pg (ref 26.0–34.0)
MCHC: 33.6 g/dL (ref 30.0–36.0)
MCV: 90.1 fL (ref 80.0–100.0)
Platelets: 214 K/uL (ref 150–400)
RBC: 5.06 MIL/uL (ref 4.22–5.81)
RDW: 12.4 % (ref 11.5–15.5)
WBC: 13.7 K/uL — ABNORMAL HIGH (ref 4.0–10.5)
nRBC: 0 % (ref 0.0–0.2)

## 2024-03-16 LAB — RESP PANEL BY RT-PCR (RSV, FLU A&B, COVID)  RVPGX2
Influenza A by PCR: NEGATIVE
Influenza B by PCR: NEGATIVE
Resp Syncytial Virus by PCR: NEGATIVE
SARS Coronavirus 2 by RT PCR: NEGATIVE

## 2024-03-16 LAB — TROPONIN I (HIGH SENSITIVITY)
Troponin I (High Sensitivity): 4 ng/L (ref <18)
Troponin I (High Sensitivity): 3 ng/L (ref <18)

## 2024-03-16 LAB — D-DIMER, QUANTITATIVE: D-Dimer, Quant: <0.27 ug{FEU}/mL (ref 0.00–0.50)

## 2024-03-16 NOTE — Discharge Instructions (Signed)
 You were seen in the emergency room for chest pain Your blood work EKG and chest x-ray all looked okay There is no evidence of heart attack or blood clot in your lungs You to follow-up with your primary doctor within 1 week for reevaluation Return to the emergency room for severe chest pain show breathing or any other concerns

## 2024-03-16 NOTE — ED Provider Notes (Signed)
 [place] EMERGENCY DEPARTMENT AT [facility] Provider Note   CSN: [id number] Arrival date & time: 03/16/24  9085     Patient presents with: Chest Pain   Shawn Schaefer is a 43 y.o. male.   Patient states he woke up this morning with chest pain across his entire chest.  States he is having some difficulty with taking deep breath.  Describes the pain as sharp and pressure.  States he was feeling well previously, denies cough/congestion, nausea, vomiting, abdominal pain, fevers, trauma, heartburn.  Never had this before, no history of cardiac problems.  No daily meds.   Chest Pain      Prior to Admission medications   Medication Sig Start Date End Date Taking? Authorizing Provider  ARIPiprazole  (ABILIFY ) 5 MG tablet Take 1 tablet (5 mg total) by mouth daily. 01/24/23 02/23/23  Massengill, Rankin, MD  FLUoxetine  (PROZAC ) 20 MG capsule Take 1 capsule (20 mg total) by mouth daily. 01/23/23 02/22/23  Massengill, Rankin, MD  lisinopril  (ZESTRIL ) 10 MG tablet Take 1 tablet (10 mg total) by mouth daily. 01/23/23 02/22/23  Massengill, Rankin, MD  Multiple Vitamin (MULTIVITAMIN WITH MINERALS) TABS tablet Take 1 tablet by mouth daily. Patient not taking: Reported on 01/18/2023    [provider]  nicotine  (NICODERM CQ  - DOSED IN MG/24 HOURS) 14 mg/24hr patch Place 1 patch (14 mg total) onto the skin daily. 01/24/23   Massengill, Rankin, MD  nicotine  polacrilex (NICORETTE ) 2 MG gum Take 1 each (2 mg total) by mouth as needed for smoking cessation. 01/23/23   Johny Rankin, MD    Allergies: Patient has no known allergies.    Review of Systems  Cardiovascular:  Positive for chest pain.    Updated Vital Signs BP (!) 147/99   Pulse 69   Temp 98.1 F (36.7 C)   Resp 17   Wt 74.4 kg   SpO2 95%   BMI 24.22 kg/m   Physical Exam Constitutional:      Appearance: He is well-developed.     Comments: Uncomfortable appearing  HENT:     Head: Normocephalic and atraumatic.   Cardiovascular:     Rate and Rhythm: Normal rate and regular rhythm.     Heart sounds: Normal heart sounds.  Pulmonary:     Effort: Pulmonary effort is normal.     Comments: Breathing mildly shallow secondary to pain Chest:     Chest wall: Tenderness present.  Abdominal:     General: Bowel sounds are normal.     Palpations: Abdomen is soft.     Tenderness: There is no abdominal tenderness.  Skin:    General: Skin is warm and dry.     Findings: No rash.  Neurological:     General: No focal deficit present.     Mental Status: He is alert.     (all labs ordered are listed, but only abnormal results are displayed) Labs Reviewed  BASIC METABOLIC PANEL WITH GFR - Abnormal; Notable for the following components:      Result Value   Glucose, Bld 136 (*)    All other components within normal limits  CBC - Abnormal; Notable for the following components:   WBC 13.7 (*)    All other components within normal limits  TROPONIN I (HIGH SENSITIVITY)  TROPONIN I (HIGH SENSITIVITY)    EKG: EKG Interpretation Date/Time:  Thursday March 16 2024 09:21:37 EDT Ventricular Rate:  66 PR Interval:  162 QRS Duration:  100 QT Interval:  400  QTC Calculation: 419 R Axis:   3  Text Interpretation: Normal sinus rhythm Minimal voltage criteria for LVH, may be normal variant ( R in aVL ) Cannot rule out Anterior infarct , age undetermined Abnormal ECG Confirmed by Francesca Fallow ([id number]) on 03/16/2024 10:36:55 AM  Radiology: [facility] Chest 2 View Result Date: 03/16/2024 CLINICAL DATA:  Chest pain and shortness of breath. Symptoms began 4 hours ago. EXAM: CHEST - 2 VIEW COMPARISON:  02/27/2015, 04/13/2008 FINDINGS: Lungs are hypoinflated demonstrate subtle linear density over the right base likely atelectasis. No lobar consolidation or effusion. Cardiomediastinal silhouette is normal. Moderate compression fracture over the midthoracic spine and mild anterior compression deformity over the mid to upper  thoracic spine unchanged. IMPRESSION: 1. Hypoinflation with minimal linear density right base likely atelectasis. 2.  Stable thoracic spine compression fractures. Electronically Signed   By: Toribio Agreste M.D.   On: 03/16/2024 10:47     Procedures   Medications Ordered in the ED - No data to display                                  Medical Decision Making 43 year old male presents with acute onset of chest pain with some shortness of breath this morning.  Differential includes ACS, PE, MSK strain, respiratory infection.  Labs completed after triage show EKG with NSR, troponin 4 > 3, BMP WNL, CBC with slight leukocytosis to 13.7.  CXR with hypoinflation with minimal linear density right base likely atelectasis.   D-dimer and respiratory viral panel pending.   Signout given to oncoming ED provider.  Amount and/or Complexity of Data Reviewed Labs: ordered. Radiology: ordered.       Final diagnoses:  None    ED Discharge Orders     None          Adele Song, MD 03/16/24 1510    Tegeler, Lonni PARAS, MD 03/16/24 [phone]

## 2024-03-16 NOTE — ED Provider Notes (Signed)
  Physical Exam  BP (!) 161/105 (BP Location: Right Arm)   Pulse (!) 59   Temp 98.1 F (36.7 C) (Oral)   Resp 18   Ht 5' 9 (1.753 m)   Wt 74.4 kg   SpO2 99%   BMI 24.22 kg/m   Physical Exam Vitals and nursing note reviewed.  HENT:     Head: Normocephalic and atraumatic.  Eyes:     Pupils: Pupils are equal, round, and reactive to light.  Cardiovascular:     Rate and Rhythm: Normal rate and regular rhythm.  Pulmonary:     Effort: Pulmonary effort is normal.     Breath sounds: Normal breath sounds.  Abdominal:     Palpations: Abdomen is soft.     Tenderness: There is no abdominal tenderness.  Skin:    General: Skin is warm and dry.  Neurological:     Mental Status: He is alert.  Psychiatric:        Mood and Affect: Mood normal.     Procedures  Procedures  ED Course / MDM   Clinical Course as of 03/16/24 1736  Thu Mar 16, 2024  1736 D-dimer negative.  Patient remained stable on room air.  Appropriate for discharge with close PCP follow-up. [MP]    Clinical Course User Index [MP] Pamella Ozell LABOR, DO   Medical Decision Making I, Ozell Pamella DO, have assumed care of this patient from the previous provider pending D-dimer reevaluation and disposition  Amount and/or Complexity of Data Reviewed Labs: ordered. Radiology: ordered.          Pamella Ozell LABOR, DO 03/16/24 1736

## 2024-03-16 NOTE — ED Provider Triage Note (Signed)
 Emergency Medicine Provider Triage Evaluation Note  Shawn Schaefer , a 43 y.o. male  was evaluated in triage.  Pt complains of chest pain.  Review of Systems  Positive: Chest pain, shortness of breath Negative: Fainting, cough  Physical Exam  BP (!) 147/99   Pulse 69   Temp 98.1 F (36.7 C)   Resp 17   Wt 74.4 kg   SpO2 95%   BMI 24.22 kg/m  Gen:   Awake, no distress   Resp:  Normal effort  MSK:   Moves extremities without difficulty  Other:    Medical Decision Making  Medically screening exam initiated at 10:36 AM.  Appropriate orders placed.  Shawn Schaefer was informed that the remainder of the evaluation will be completed by another provider, this initial triage assessment does not replace that evaluation, and the importance of remaining in the ED until their evaluation is complete.     Shawn Elsie CROME, MD 03/16/24 1036

## 2024-03-16 NOTE — ED Triage Notes (Signed)
 Pt complains of chest pain radiates to right and left side of chest with SOB. Denies any jaw or arm pain. Pain started about 4 hours ago after waking up. Denies n/v
